# Patient Record
Sex: Male | Born: 1960
Health system: Southern US, Community
[De-identification: ages and names within clinical notes are randomized; demographics above are authoritative.]

## PROBLEM LIST (undated history)

## (undated) DIAGNOSIS — M1710 Unilateral primary osteoarthritis, unspecified knee: Secondary | ICD-10-CM

## (undated) DIAGNOSIS — R7303 Prediabetes: Secondary | ICD-10-CM

## (undated) DIAGNOSIS — E785 Hyperlipidemia, unspecified: Secondary | ICD-10-CM

## (undated) DIAGNOSIS — I10 Essential (primary) hypertension: Secondary | ICD-10-CM

## (undated) DIAGNOSIS — M179 Osteoarthritis of knee, unspecified: Secondary | ICD-10-CM

## (undated) HISTORY — DX: Prediabetes: R73.03

## (undated) HISTORY — DX: Unilateral primary osteoarthritis, unspecified knee: M17.10

## (undated) HISTORY — PX: MOUTH SURGERY: SHX715

## (undated) HISTORY — DX: Essential (primary) hypertension: I10

## (undated) HISTORY — DX: Osteoarthritis of knee, unspecified: M17.9

## (undated) HISTORY — PX: HERNIA REPAIR: SHX51

## (undated) HISTORY — DX: Hyperlipidemia, unspecified: E78.5

---

## 2001-04-20 ENCOUNTER — Encounter: Payer: Self-pay | Admitting: Emergency Medicine

## 2001-04-20 ENCOUNTER — Emergency Department (HOSPITAL_COMMUNITY): Admission: EM | Admit: 2001-04-20 | Discharge: 2001-04-20 | Payer: Self-pay | Admitting: Emergency Medicine

## 2001-07-06 HISTORY — PX: OTHER SURGICAL HISTORY: SHX169

## 2002-03-16 ENCOUNTER — Inpatient Hospital Stay (HOSPITAL_COMMUNITY): Admission: AC | Admit: 2002-03-16 | Discharge: 2002-04-05 | Payer: Self-pay

## 2002-03-16 ENCOUNTER — Encounter: Payer: Self-pay | Admitting: Orthopedic Surgery

## 2002-03-19 ENCOUNTER — Encounter: Payer: Self-pay | Admitting: Orthopedic Surgery

## 2002-03-21 ENCOUNTER — Encounter (INDEPENDENT_AMBULATORY_CARE_PROVIDER_SITE_OTHER): Payer: Self-pay

## 2002-03-21 ENCOUNTER — Encounter: Payer: Self-pay | Admitting: Vascular Surgery

## 2002-03-23 ENCOUNTER — Encounter: Payer: Self-pay | Admitting: Orthopedic Surgery

## 2002-03-26 ENCOUNTER — Encounter: Payer: Self-pay | Admitting: Specialist

## 2002-05-02 ENCOUNTER — Encounter: Admission: RE | Admit: 2002-05-02 | Discharge: 2002-07-31 | Payer: Self-pay | Admitting: Orthopedic Surgery

## 2002-08-01 ENCOUNTER — Encounter: Admission: RE | Admit: 2002-08-01 | Discharge: 2002-10-13 | Payer: Self-pay | Admitting: Orthopedic Surgery

## 2003-03-30 ENCOUNTER — Emergency Department (HOSPITAL_COMMUNITY): Admission: EM | Admit: 2003-03-30 | Discharge: 2003-03-30 | Payer: Self-pay | Admitting: Emergency Medicine

## 2003-03-30 ENCOUNTER — Encounter: Payer: Self-pay | Admitting: Emergency Medicine

## 2003-04-03 ENCOUNTER — Ambulatory Visit (HOSPITAL_COMMUNITY): Admission: RE | Admit: 2003-04-03 | Discharge: 2003-04-03 | Payer: Self-pay | Admitting: Orthopedic Surgery

## 2003-04-03 ENCOUNTER — Ambulatory Visit (HOSPITAL_BASED_OUTPATIENT_CLINIC_OR_DEPARTMENT_OTHER): Admission: RE | Admit: 2003-04-03 | Discharge: 2003-04-03 | Payer: Self-pay | Admitting: Orthopedic Surgery

## 2003-09-17 ENCOUNTER — Emergency Department (HOSPITAL_COMMUNITY): Admission: EM | Admit: 2003-09-17 | Discharge: 2003-09-17 | Payer: Self-pay

## 2004-03-22 ENCOUNTER — Emergency Department (HOSPITAL_COMMUNITY): Admission: EM | Admit: 2004-03-22 | Discharge: 2004-03-22 | Payer: Self-pay | Admitting: Emergency Medicine

## 2004-09-19 IMAGING — CR DG FEMUR 2+V*R*
4 series · 4 of 4 positions shown · non-contrast
Comparison: none

CLINICAL DATA: Leg pain, no injury.
 RIGHT FEMUR, TWO VIEWS 
 There is no evidence of fracture or focal bone lesions.  No other significant bone or soft tissue abnormalities are identified.
 IMPRESSION
 Normal study.

[view not recorded (1 of 4)]
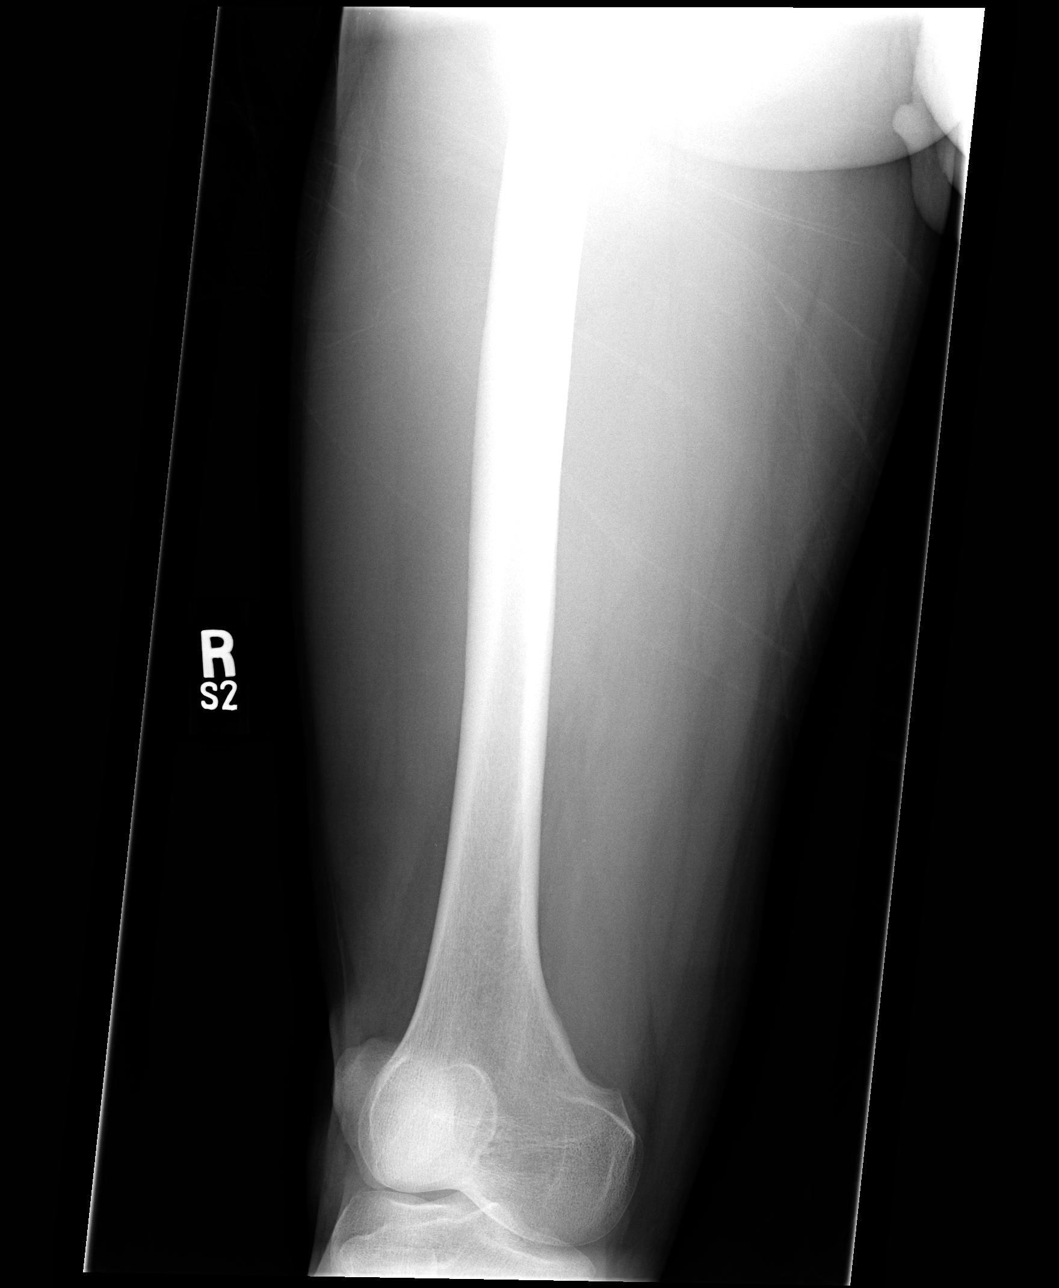

[view not recorded (2 of 4)]
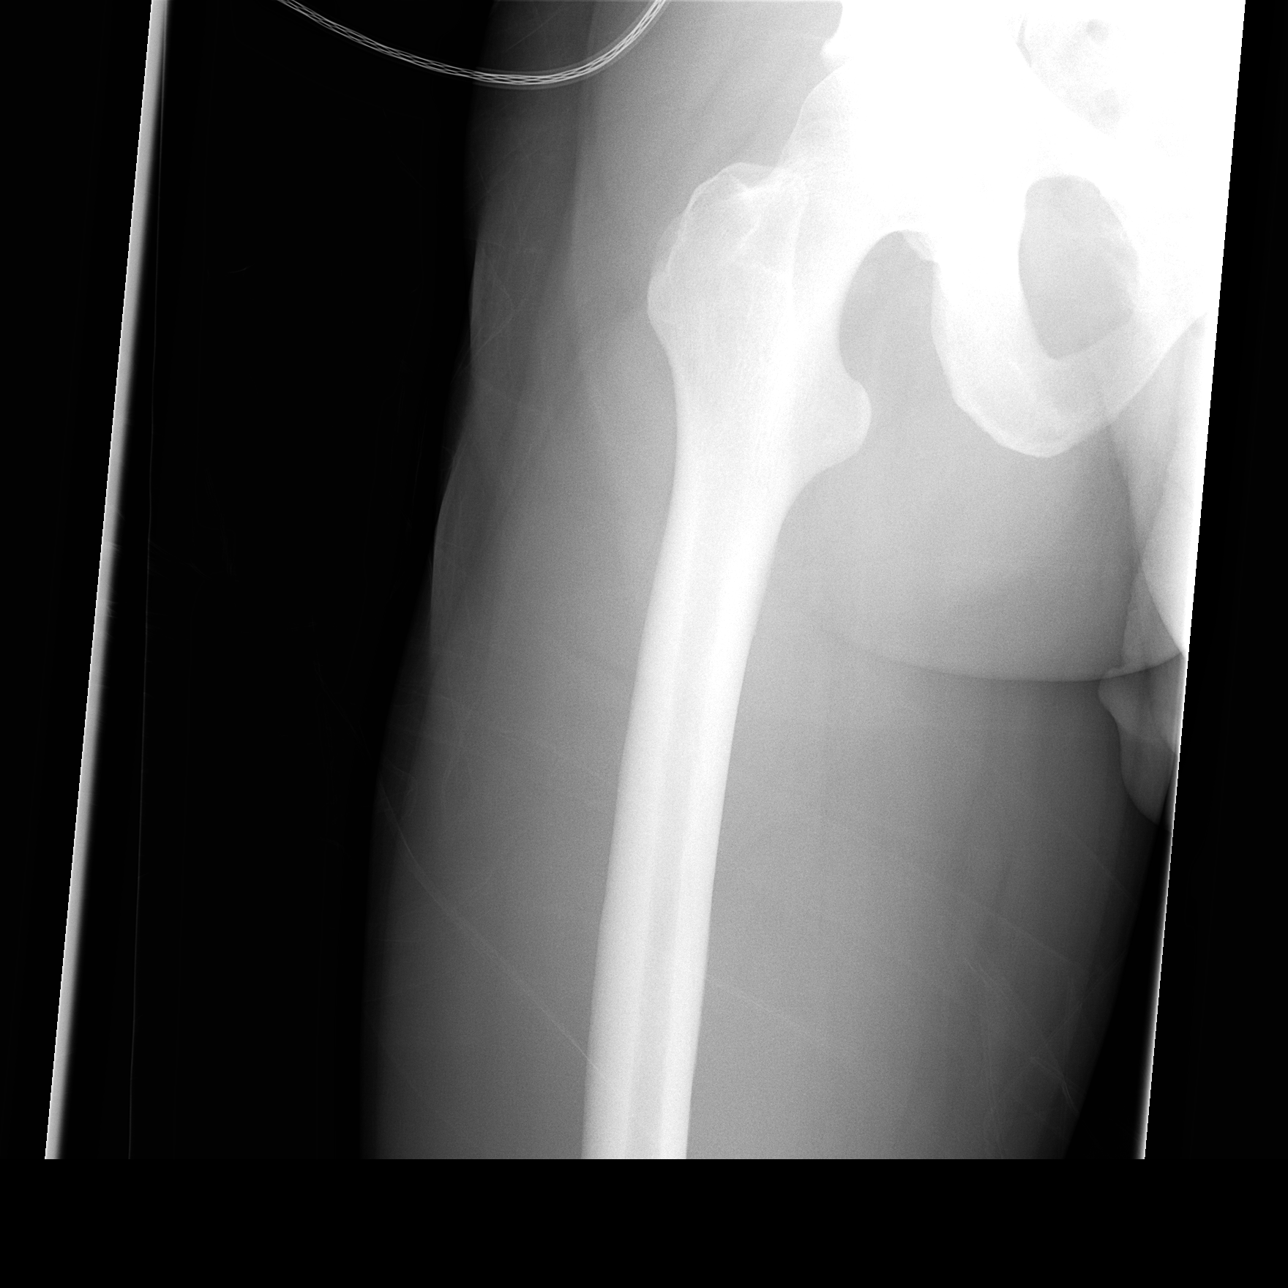

[view not recorded (3 of 4)]
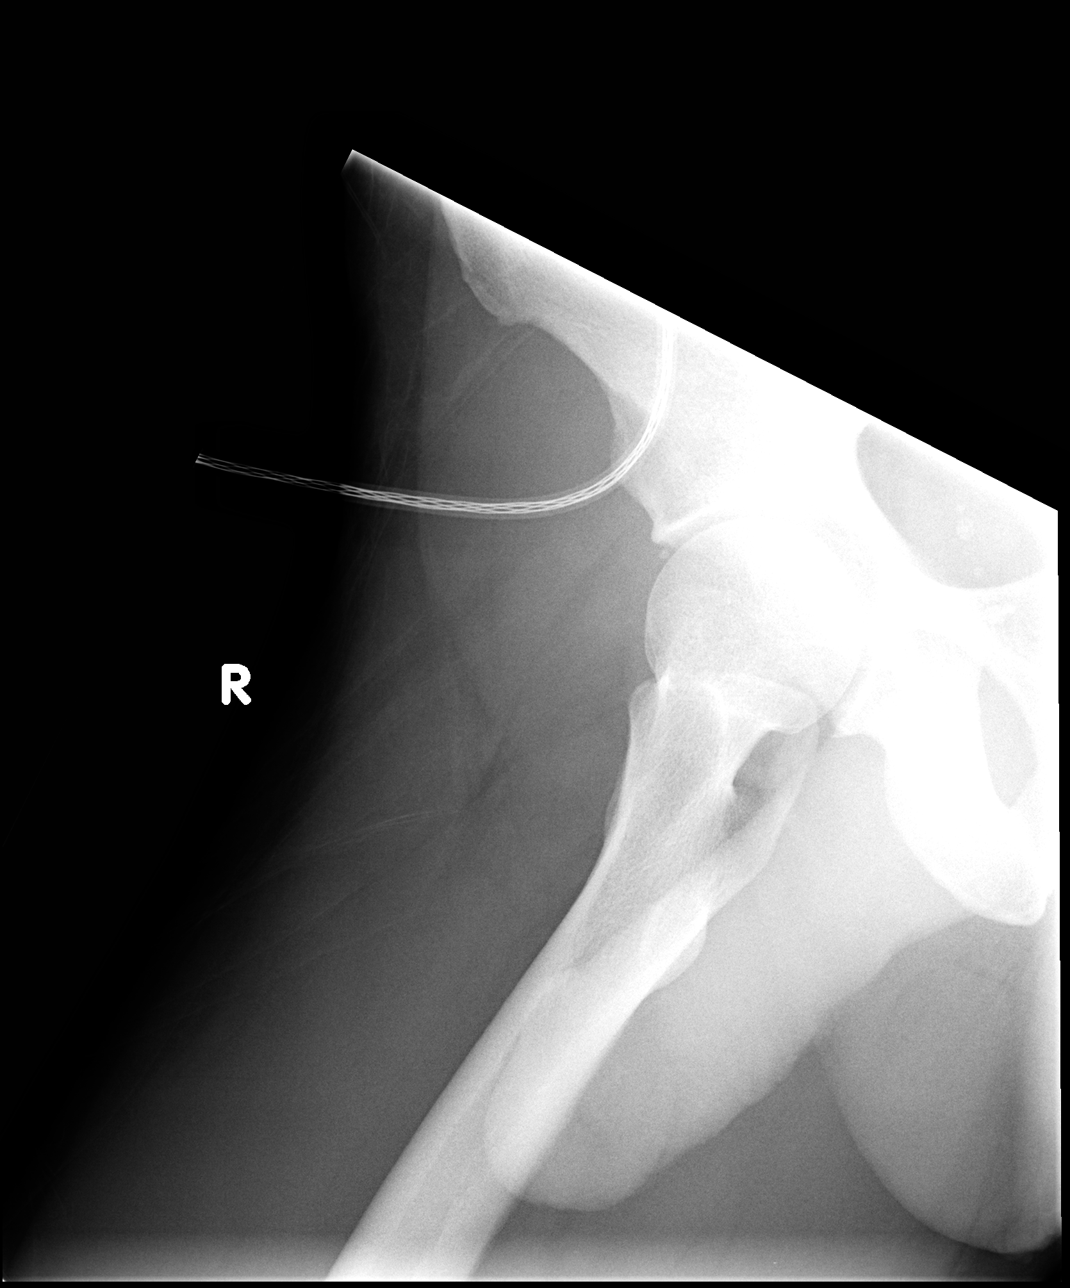

[view not recorded (4 of 4)]
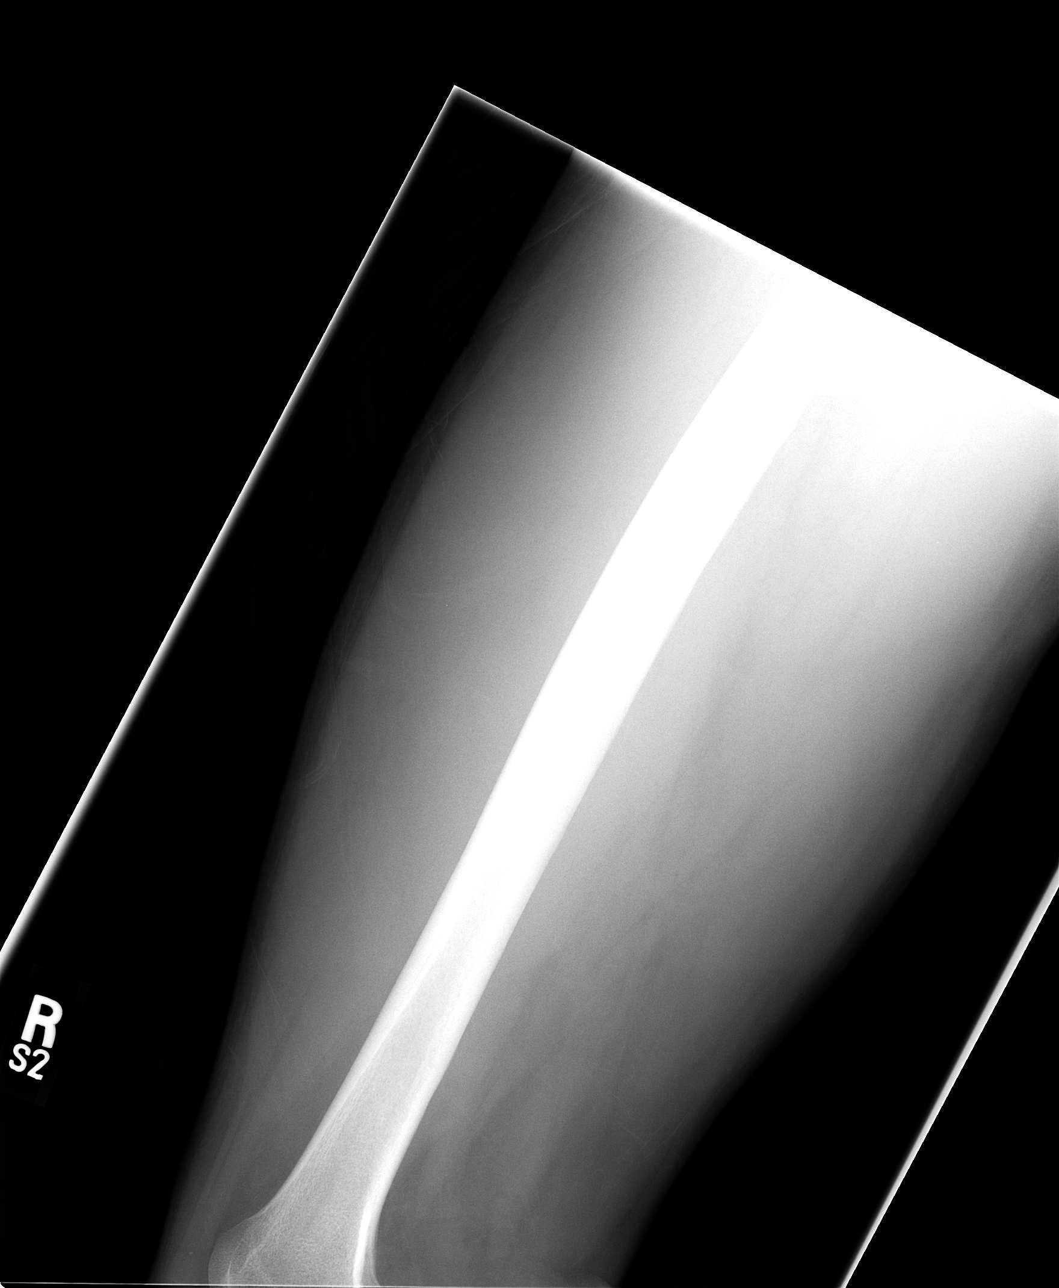

[4 of 4 positions shown; findings below may reference images not displayed]

## 2005-11-06 ENCOUNTER — Emergency Department (HOSPITAL_COMMUNITY): Admission: EM | Admit: 2005-11-06 | Discharge: 2005-11-06 | Payer: Self-pay | Admitting: Emergency Medicine

## 2006-01-18 ENCOUNTER — Emergency Department (HOSPITAL_COMMUNITY): Admission: EM | Admit: 2006-01-18 | Discharge: 2006-01-18 | Payer: Self-pay | Admitting: Emergency Medicine

## 2007-10-22 ENCOUNTER — Inpatient Hospital Stay (HOSPITAL_COMMUNITY): Admission: EM | Admit: 2007-10-22 | Discharge: 2007-10-24 | Payer: Self-pay | Admitting: Emergency Medicine

## 2007-11-22 ENCOUNTER — Ambulatory Visit (HOSPITAL_COMMUNITY): Admission: RE | Admit: 2007-11-22 | Discharge: 2007-11-22 | Payer: Self-pay | Admitting: Otolaryngology

## 2008-04-16 ENCOUNTER — Emergency Department (HOSPITAL_COMMUNITY): Admission: EM | Admit: 2008-04-16 | Discharge: 2008-04-16 | Payer: Self-pay | Admitting: Emergency Medicine
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
## 2010-11-21 NOTE — Op Note (Signed)
   NAME:  MARCEL, GARY                           ACCOUNT NO.:  [id number]   MEDICAL RECORD NO.:  [id number]                   PATIENT TYPE:  INP   LOCATION:  3303                                 FACILITY:  [facility]   PHYSICIAN:  Philips J. Montez Morita, M.D.             DATE OF BIRTH:  01/05/1961   DATE OF PROCEDURE:  03/26/2002  DATE OF DISCHARGE:                                 OPERATIVE REPORT   PREOPERATIVE DIAGNOSES:  Open amputation below-knee, right leg.   POSTOPERATIVE DIAGNOSES:  Open amputation below-knee, right leg.   OPERATION PERFORMED:  Dressing change under anesthesia with some debridement  and irrigation and debridement of wound.   SURGEON:  Philips J. Montez Morita, M.D.   ASSISTANT:  Druscilla Brownie. Cherlynn June.   ANESTHESIA:  General.   DESCRIPTION OF PROCEDURE:  After suitable general anesthesia, the dressing  was changed.  There were a few small areas of dead tissue that were easily  debrided with knife and pick up.  As the dressings were removed, there was  one vigorous arterial bleeder in the lower end of the wound that was clamped  and tied with a 3-0 coated Vicryl.  There was nothing about the area that  looked of infection.  There was nothing that appeared to need culture.  There were no pockets that were noted.  So the wound was then prepped with  Betadine and irrigated with power lavage and then with some antibiotic  solution.  It was then dressed with Xeroform and a compression dressing.  The patient was then transferred to the recovery room in good condition.                                                 Philips J. Montez Morita, M.D.    PJC/MEDQ  D:  03/26/2002  T:  03/27/2002  Job:  [id number]

## 2010-11-21 NOTE — Op Note (Signed)
NAME:  ETAN, VASUDEVAN                           ACCOUNT NO.:  [id number]   MEDICAL RECORD NO.:  [id number]                   PATIENT TYPE:  INP   LOCATION:  2550                                 FACILITY:  [facility]   PHYSICIAN:  Vania Rea. Supple, M.D.               DATE OF BIRTH:  Nov 09, 1960   DATE OF PROCEDURE:  03/16/2002  DATE OF DISCHARGE:                                 OPERATIVE REPORT   PREOPERATIVE DIAGNOSIS:  Grade 3B open left midshaft tibia-fibula fracture.   POSTOPERATIVE DIAGNOSIS:  Grade 3B open left midshaft tibia-fibula fracture.   PROCEDURES:  1. Exploration, irrigation, and debridement of grade 3B open left tibia-     fibula fracture.  2. Exploration and neurolysis of the posterior tibial and anterior tibial     nerves and associated arteries.  3. External fixation of left grade 3B open tibia-fibula fracture.   SURGEON:  Vania Rea. Supple, M.D.   Threasa HeadsFrench Ana A. Shuford, P.A.-C.   ANESTHESIA:  General endotracheal.   ESTIMATED BLOOD LOSS:  300 cc.   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  He sustained a severe twisting injury as his leg was  wrapped around the auger device.  In the field he was found to have an  obvious deformity of the left leg with a dysvascular foot.  He was brought  to the [facility] emergency room, where evaluation showed a pulseless left  foot although with Doppler evaluation and some straightening of his foot, a  dopplerable pulse could intermittently be obtained.  He did complain of  diffuse paresthesias in the left foot, and there was sluggish capillary  refill.  The wound itself was highly contaminated with dirt and other  organic material.  The tibial shaft had been stripped approximately 12 cm  proximally and 8 cm distally with a butterfly fragment.  Initially it was  felt that due to the severe soft  tissue injury, contamination, and  neurovascular status, that a primary amputation would be indicated, and we  initially discussed with the patient a primary amputation and delayed  closure.  He had consented for an amputation and was taken to the operating  room emergently for debridement.   I counseled the patient and his family preoperatively regarding the various  treatment options, and it had initially been my recommendation that we  proceed with an amputation due to what I felt was apparent loss of sensation  on the plantar aspect of the foot and complete disruption of all  neurovascular structures.  They had agreed with this plan.   DESCRIPTION OF PROCEDURE:  The patient was brought emergently to the  operating room and underwent smooth induction of general endotracheal  anesthesia.  We applied a tourniquet to the thigh, but this  was not  inflated.  We performed an initial superficial debridement of the gross  contamination and then sterilely prepped and draped the leg in standard  fashion.  We performed a sequential debridement beginning at the periphery  of the wound and moved from proximal to distal and superficial to deep,  encountered several areas where several ounces of dirt had been caught in  the tissues.  We performed a meticulous debridement and utilized both  pulsatile lavage as well as the DePuy soft tissue debrider to meticulously  debride all soft tissue surfaces of the gross contamination.  We then  identified the posteromedial and anterior neurovascular bundles and  carefully followed these from proximal to distal and found that the tibial  nerve was in continuity throughout the zone of trauma and, in addition, the  tibial artery had a pulse throughout the zone of trauma, although it was  markedly contused and was under apparent vasospasm.  We then used the  Doppler and found a pulse distally beyond the site of trauma.  The tibialis  anterior and deep peroneal  nerve were then identified throughout proximal to  distal, and again we had pulses throughout this zone by Doppler.  At this  point it was felt that since the nerves were in continuity that attempt  could be made to save the foot and perform an external fixation and wound  debridement.  We meticulously debrided all bony and soft tissue surfaces and  utilized 12 L of saline with pulse lavage.  Once meticulous debridement had  been completed, we then placed a Synthes anterior frame with two threaded  half pins proximally and two threaded half pins distally through the tibial  shaft and metaphysis.  These were then connected utilizing two connecting  bars.  We were able to obtain an anatomic reduction on the tibia and the  fibula based on direct visualization.  We used fluoroscopic imaging to  confirm that the threaded half pins were at the appropriate depth and also  to confirm appropriate alignment once the frame was tightened.  Once we  confirmed good alignment, a final irrigation was performed.  The wound was  then packed open with a normal saline wet-to-dry dressing.  A the end of the  case there was a palpable dorsal pedal pulse, and the patient did report  that he had gross sensation intact to the dorsal and plantar aspects of the  left foot.  He was subsequently extubated and taken to the recovery room in  stable condition, where postoperative neurovascular examination confirmed  the above-mentioned findings of grossly intact sensation.                                               Vania Rea. Supple, M.D.    KMS/MEDQ  D:  03/16/2002  T:  03/17/2002  Job:  [id number]

## 2010-11-21 NOTE — Op Note (Signed)
NAME:  Carl Vang, Carl Vang                           ACCOUNT NO.:  [id number]   MEDICAL RECORD NO.:  [id number]                   PATIENT TYPE:  INP   LOCATION:  3303                                 FACILITY:  [facility]   PHYSICIAN:  Vania Rea. Supple, M.D.               DATE OF BIRTH:  [birth year]   DATE OF PROCEDURE:  03/20/2002  DATE OF DISCHARGE:                                 OPERATIVE REPORT   PREOPERATIVE DIAGNOSES:  Left grade 3B highly contaminated tib-fib fracture.   POSTOPERATIVE DIAGNOSES:  1. Left grade 3B highly contaminated tib-fib fracture.  2. 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   OPERATION PERFORMED:  Exploration, irrigation and debridement of  myonecrosis, gas gangrene involving grade 3B open left tib-fib fracture.   SURGEON:  Vania Rea. Supple, M.D.   Threasa HeadsFrench Ana A. Vang, P.A.-C.   ANESTHESIA:  General endotracheal.   ESTIMATED BLOOD LOSS:  Minimal.   Wounds packed open.  Intraoperative cultures, aerobic and anaerobic from the  deep posterior and superficial posterior compartments.   INDICATIONS FOR PROCEDURE:  The patient is a 50 year old gentleman who  sustained a devastating grade 3B highly contaminated tib-fib fracture on the  left when his leg was caught in an auger last Thursday.  He was taken  emergently to surgery for aggressive incision and drainage and external  fixation and follow-up incision and drainage and was brought back for a  third incision and drainage today.  Over the weekend he has been  persistently febrile to 103 degrees with tachycardia and persistent foul  smell from the left leg.  He has maintained some limited digital motion and  intact but moderately decreased sensation to the plantar aspect of the left  foot.  He is brought to the operating room at this time for initially  planned incision and drainage with possible VAC placement.   Preoperatively I discussed with Carl Vang the gravity of the situation and  the fact that he may ultimately require amputation due to the severe  contamination and soft tissue loss.  In addition, his persistent fevers are  an ominous sign.  He understands, accepts and agrees with plan for incision  and drainage.   DESCRIPTION OF PROCEDURE:  After undergoing routine preop evaluation, the  patient was brought to the operating room, placed supine on the operating  room and underwent smooth induction of general endotracheal anesthesia.  Dressings were removed from the left leg which were very foul-smelling and  obviously colonized with Pseudomonas.  The wound itself showed some limited  areas of viable muscle superficially but the skin edges were all dusky and  necrotic and then on further exploration, there was evidence of gross  purulence and gas with extremely foul smell emanating from the deeper  tissues.  The leg was sterilely  prepped and draped in standard fashion.  The  previously irrigated fascial planes were reopened and cultures were obtained  from both the deep and superficial posterior compartments.  Abundant  purulence and foul smell was encountered through the deeper layers and  several areas of gross contamination with gravely debris were also  encountered and removed.  The entire musculature of the deep posterior  compartment had necrosed and this was removed in its entirety.  In addition,  the lateral compartment was high necrotic and the superficial posterior  compartment musculature was also all necrosed and removed in its entirely.  This left the overlying skin and the tibialis posterior neurovascular bundle  as well as the anterior compartment musculature as the only tissues  connecting between proximal and distal.  In addition, there was obvious  frank necrosis of tissues extending into the foot and ankle region in the  distal aspect of the wound.  It was felt that this  was not a salvageable  situation.  However, the patient had not been consented for an amputation.  I did intraoperatively speak with family members present and they all felt  that it would be better to have a family discussion prior to completion of  the amputation.  I did feel as though we had obtained appropriate debulking  of the myonecrosis and complete decompression of the collections of gas and  fluid and that it would reasonable to allow family discussion with return to  the operating room later this evening or early in the morning for completion  of the amputation.  At this point the leg was pulsatilely lavaged.  A wet-to-  dry dressing was applied.  The patient was then extubated and taken to the  recovery room in stable condition.                                                 Vania Rea. Supple, M.D.    KMS/MEDQ  D:  03/20/2002  T:  03/20/2002  Job:  [id number]

## 2010-11-21 NOTE — Op Note (Signed)
NAME:  Carl Vang, Carl Vang                           ACCOUNT NO.:  [id number]   MEDICAL RECORD NO.:  [id number]                   PATIENT TYPE:  INP   LOCATION:  5009                                 FACILITY:  [facility]   PHYSICIAN:  Vania Rea. Supple, M.D.               DATE OF BIRTH:  May 12, 1961   DATE OF PROCEDURE:  03/17/2002  DATE OF DISCHARGE:                                 OPERATIVE REPORT   PREOPERATIVE DIAGNOSIS:  Open left grade 3B tibia-fibula fracture.   POSTOPERATIVE DIAGNOSIS:  Open left grade 3B tibia-fibula fracture.   PROCEDURE:  Exploration, irrigation, and debridement of the left grade 3B  tibia-fibula fracture.   SURGEON:  Vania Rea. Supple, M.D.   Threasa HeadsFrench Ana A. Shuford, P.A.-C.   ANESTHESIA:  LMA general.   TOURNIQUET TIME:  None was used.   ESTIMATED BLOOD LOSS:  Less than 100 cc.   DRAINS:  None.   HISTORY:  The patient is a 50 year old gentleman who sustained a highly  contaminated grade 3B tibia-fibula fracture with a large soft tissue wound.  He underwent the initial irrigation and debridement and stabilization with  external fixation yesterday.  He was brought back to the operating room  today for repeat I&D and wound exploration.   Preoperatively the patient was again counseled on treatment options and  risks versus benefits thereof.   DESCRIPTION OF PROCEDURE:  The patient was brought to the operating room and  placed supine on the operating table and underwent smooth induction of LMA  general anesthesia.  The dressings were removed from the left lower  extremity, and the foot appeared viable with brisk capillary refill.  Pulses  have been intermittent palpable but continuously dopplerable since the  fixation.  The wound itself had abundant serosanguineous drainage.  The leg  was sterilely prepped and draped in standard fashion.  Utilizing pulsatile  lavage to debride the wound and then meticulously went throughout the wound  and removed  residual organic material, primarily dirt and gravel, which was  actually quite minimal compared to the wound yesterday.  There were still  several portions of sand and gravel that we removed.  The wound was  meticulously cleaned, and then we completed the pulsatile lavage with a  total of 8 L.  We inspected the neurovascular bundles, and these again  appeared to be intact, and the foot appeared viable at the end of the case.  A triple antibiotic solution was then used to soak a Kerlix, and we made a  damp-to-dry dressing, wrapping the extremity.  The patient was then  extubated and taken to the recovery room in stable condition.   The postop plan will be for a return to the operating room Monday morning  for repeat I&D as well as placement of a V.A.C.  Vania Rea. Supple, M.D.    KMS/MEDQ  D:  03/17/2002  T:  03/17/2002  Job:  [id number]

## 2010-11-21 NOTE — Op Note (Signed)
NAME:  Carl Vang, Carl Vang                           ACCOUNT NO.:  [id number]   MEDICAL RECORD NO.:  [id number]                   PATIENT TYPE:  INP   LOCATION:  3303                                 FACILITY:  [facility]   PHYSICIAN:  Vania Rea. Supple, M.D.               DATE OF BIRTH:  [birth year]   DATE OF PROCEDURE:  03/24/2002  DATE OF DISCHARGE:                                 OPERATIVE REPORT   PREOPERATIVE DIAGNOSES:  Open right below-knee amputation.   POSTOPERATIVE DIAGNOSES:  Open right below-knee amputation.   PROCEDURE:  Exploration, irrigation, and debridement of open left below-knee  amputation.   SURGEON:  Vania Rea. Supple, M.D.   ASSISTANT:  _____________ P.A.-C.   ANESTHESIA:  LMA, general.   ESTIMATED BLOOD LOSS:  100 cc.   SPECIMENS:  Aerobic and anaerobic cultures were obtained from area of  purulence noted in the central aspect of the wound.   DRAINS:  None.   HISTORY:  The patient is a 50 year old gentleman who sustained a grade III  __________fracture which went on to gas gangrene, requiring a guillotine  amputation.  He was brought back to the operating room at this time for  planned repeat I&D.   PROCEDURE IN DETAIL:  After undergoing routine prep and evaluation, the  patient was brought to the operating room and placed supine on the operating  table and under the smooth induction of LMA and general anesthesia.  The  original postop dressings were removed, and there was very minimal evidence  for Pseudomonas colonization of the dressings.  The stump itself appeared  generally to be healing well with no gross foul smell.  He had several  superficial areas of skin and tissue necrosis.  Retention sutures which had  been placed at the last surgery were removed.  There were several areas of  superficial purulence, in particular one adjacent to the tibial shaft, and  we did a panculture, both aerobic and anaerobic from this region.  The  extremity was sterilely  prepped and draped in a standard fashion at this  point.  We then reopened all of the tissue planes and found several small  areas of purulence but no obvious foul smell.  We performed a combination of  sharp dissection, as well as debridement with a rongeur, removing several  nonviable areas.  The skin edges were trimmed of necrotic tissue, and we  further contoured the muscle flaps.  All of the remaining muscle areas were  then tested with electrocautery and noted to be contractile.  A pulsatile  lavage was then utilized to irrigate 8 liters of saline through the wound,  obtaining healthy-appearing, clean  tissue margins.  We then utilized a 2-0 nylon to loosely reapproximate the  soft tissue envelope over the end of the tibia.  A saline damp-to-dry  dressing was then applied, followed by ABDs and an Ace bandage.  The patient  was then extubated and taken to the recovery room in stable condition.                                               Vania Rea. Supple, M.D.    KMS/MEDQ  D:  03/24/2002  T:  03/26/2002  Job:  [id number]

## 2010-11-21 NOTE — Discharge Summary (Signed)
NAME:  Carl Vang, Carl Vang                           ACCOUNT NO.:  [id number]   MEDICAL RECORD NO.:  [id number]                   PATIENT TYPE:  INP   LOCATION:  5027                                 FACILITY:  [facility]   PHYSICIAN:  Vania Rea. Supple, M.D.               DATE OF BIRTH:  10-27-1960   DATE OF ADMISSION:  03/16/2002  DATE OF DISCHARGE:  04/05/2002                                 DISCHARGE SUMMARY   ADMISSION DIAGNOSIS:  Near complete amputation of left lower extremity from  auger accident.   DISCHARGE DIAGNOSES:  1. Status post completion of left lower leg below-knee amputation and     multiple other procedures as described below.  2. Postoperative hemorrhagic anemia requiring multiple transfusions.   CONSULTANTS:  Trauma consultant is Dr. Abbey Chatters. CVTS consultants are Dr.  Waverly Ferrari and Dr. Liliane Bade. Dr. Ninetta Lights, infectious disease  consultant.   OPERATIONS:  First operation on 03/16/02, exploration, irrigation, and  debridement of grade III-B open left TIB-FIB fracture. The second part of  the procedure was exploration and neurolysis of posterior tibial and  anterior tibial nerves and associated arteries and application of external  fixator to left graft III-B open TIB-FIB fracture on 03/16/02 under general  anesthesia. On 03/17/02, reexploration and debridement of left lower leg. On  03/17/02, he also had an arteriogram performed by Dr. Edilia Bo. On 03/20/02, he  had a repeat irrigation and debridement and exploration of left lower leg  with findings of myonecrosis and gas gangrene. A second procedure on 03/20/02  of complete irrigation and completion of amputation of left below the knee.  All of these procedures that I just listed were by Dr. Rennis Chris and Ralene Bathe who was the assistant, except for the arteriogram. On 9/19,  reexploration, irrigation and debridement of left below-knee amputation. On  9/21, another procedure by Dr. Ronnell Guadalajara, a repeat  irrigation and  debridement of left below-knee amputation and again on 9/23 a repeat  irrigation and debridement of left below-knee amputation with secondary  closure, and this was Dr. Rennis Chris at this point.   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  He was brought to [facility] emergency room where he was found to have a  pulseless left foot; however, on his initial presentation, a Doppler  evaluation had had an earlier present pulse. Upon straightening the foot,  the dopplerable pulse was returned. He did complain of diffuse paresthesias  preoperatively. He has significant grade III-B left TIB-FIB fracture  associated with this injury. Initially, it was felt due to severe soft  tissue injury contamination as it was an extremely dirty wound, and primary  amputation would be most indicated. On presentation to the  first operating  room visit, we did find after reducing the fracture and cleaning the wound  intact posterior tibial and anterior tibial nerves and associated arteries.  Unfortunately, there was a significant gross contamination of the soft  tissue wound. It was meticulously pulsatile lavaged, and it was decided to  try to salvage the limb, and so therefore, an external fixator was applied  with the idea to return the following morning for repeat I&D. This was  performed the following day, and after second I&D, he unfortunately had a  vasospasm, prompting CVTS evaluation. Arteriograms were performed at that  time which he was found to have a continued extensive bony and soft tissue  injury with apparent occlusion of the left anterior tibial and peroneal  arteries; however, the left posterior tibial artery was found to be intact,  only with intense spasm. He was maintained in intensive care unit  through  the weekend and again taken to the operating room on 03/20/02 for repeat I&D.  He did have significant temperature spikes through the weekend. He remained  persistently febrile up to 103 with tachycardia and persistent foul smell  from the leg. He had maintained some limited digital motion distally,  however, had moderately decreased sensation. He was taken back to the  operating room the morning of 03/20/02 where the intraoperative findings were  a very foul-smelling, obviously colonized Pseudomonas wound with myonecrosis  and limited areas of any viable muscle apparent. As the patient had not been  permitted for an amputation, it was decided to have a meeting throughout the  day and to return the evening of 9/15 for repeat I&D and for left below-knee  amputation. He did return to the operating room on 9/15 with complete  amputation and left the wound open due to its contamination. Cultures had  been taken, and he was on wide spectrum IV antibiotics. His sepsis cleared,  and he again returned to the operating room on 9/19 and then underwent every  other day washouts until we found some viable skin edges without any  evidence of any continued infection. These were completed on 9/19, 9/21, and  03/28/02. We did get infectious disease consult to help [place] along with his IV  antibiotics. He had been maintained on gentamycin, Zosyn, initially Ancef,  and a wide spectrum of IV antibiotics, and when no further sign of sepsis  and after his wound being closed for over 72 hours without any temperature  spikes, he was changed to oral Cipro. The patient did as expected with  multiple procedures have postoperative anemias, and he was transfused a  total of 7 units throughout this course of hospitalization. He had a very  lengthy stay and unfortunately was unable to salvage his left limb, however, with multiple I&Ds, did show good evidence for skin healing and without any  further evidence of  infection. He had been transferred up to the regular  orthopedic floor and had no further signs of sepsis for multiple days.  Rehabilitation consult had been ordered; however, patient was doing all of  his transfers on his own to wheelchair and ambulating unassisted with walker  device. His wife was a nurses aid, and due to the lengthy hospitalization,  the decision was made for him to discharge to home with home health needs  arranged. On 04/05/02, he had been afebrile for over 72 hours, his white  counts had decreased to within normal range-his last count being 9.3, and  his last hemoglobin before  discharge was 10.3. Multiple lab drawings found  in chart and may review these for details. His white count had gone to a  high of 23 and 25 around the time of his gas gangrene and his amputation;  however, multiple white counts have been normal prior to discharge. Multiple  cultures also take which are in the chart for details. He did have  Pseudomonas as well as Serratia marcescens grow out of his wound as well as  Enterobacter. His blood type was noted to be A+. Multiple other lab reports  are seen; please see that section for details. Seven units found transfused  and recorded in the chart. X-rays postoperatively initially showed an  external fixator with anatomic alignment of TIB-FIB fracture. On 9/14, there  was right basilar pneumonia or atelectasis noted. On 9/16, a minimal  perihilar atelectasis was noted and again on 9/18, and on 9/21, a central  line was noted with no pneumothorax.   CONDITION ON DISCHARGE:  Stable.   DISCHARGE PLANS:  The patient has been discharged to home with his family.  He is on Cipro 750 mg b.i.d. for one month. He is provided with  prescriptions with Percocet 5/325, #40, one to two q.4-6h. p.r.n. pain. He  will follow up in our office in one week for repeat evaluation of the skin  edges. He is for transfers to wheelchair and with his walker as tolerated.   Daily dressing changes with a home health nurse with Adaptic, ABD, and  Kerlix and call for any changes or foul smell or increased drainage. We will  follow him on an outpatient basis and refer to him amputee clinic program.  He is also placed on Trinsicon one t.i.d., iron supplement. He is on one  aspirin a day. He will call the office for any further details.     Tracy A. Shuford, P.A.-C.                 Vania Rea. Supple, M.D.    TAS/MEDQ  D:  05/03/2002  T:  05/04/2002  Job:  [id number]

## 2010-11-21 NOTE — Consult Note (Signed)
NAME:  Carl Vang, Carl Vang                           ACCOUNT NO.:  [id number]   MEDICAL RECORD NO.:  [id number]                   PATIENT TYPE:  INP   LOCATION:  2550                                 FACILITY:  [facility]   PHYSICIAN:  Di Kindle. Edilia Bo, M.D.        DATE OF BIRTH:  [birth year]   DATE OF CONSULTATION:  03/17/2002  DATE OF DISCHARGE:                                   CONSULTATION   REASON FOR CONSULTATION:  Left leg injury with possible vascular compromise.   HISTORY OF PRESENT ILLNESS:  This is a 50 year old gentleman who reportedly  got his left leg caught in an auger and sustained a grade IIIB left tibia-  fibula fracture.  This patient was taken tot the operating room on 03/16/2002  by Dr. Rennis Chris at which time it was noted that the posterior tibial artery  was intact.  There was no active bleeding from the peroneal artery that  could be determined, and there was som periarterial hematoma around the  anterior tibial artery.  After the patient underwent application of an  external fixator with the leg back out at length, the patient had good  Doppler signals in the foot reportedly, and the foot appeared well perfused.  On postoperative day #1, it was noted that there was markedly diminished  posterior tibial signal with no other signals obtainable,and the foot looked  somewhat mottled.   For this reason, he underwent an arteriogram by Dr. Madilyn Fireman which showed no  evidence of occlusive disease in the femoral, superficial femoral, or  popliteal arteries. The anterior tibial and peroneal arteries were occluded  proximally, and the posterior tibial artery was patent to the foot; however,  there was significant diffuse spasm within the posterior tibial artery.  It  was felt that the patient had adequate circulation in the right foot based  on the posterior tibial artery and that the spasm should resolve with time.   PAST MEDICAL HISTORY:  Unobtainable at this time.  This patient  recently had  surgery.  Today he underwent I&D and exploration of his left tibia-fibula  fracture.   PHYSICAL EXAMINATION:  VITAL SIGNS:  Blood pressure 120/70, heart rate 70.  VASCULAR:  He has a palpable femoral pulse.  He has a very brisk posterior  tibial signal with the Doppler in  the left foot, and the left foot appears  adequately perfused.  I am unable to obtain a peroneal or dorsalis pedis  signal.   ASSESSMENT:  This patient obviously is at significant risk for limb loss  given the extensive nature of the wound and possible nerve injury.  I am  unable to assess the nerve function at this time as the patient is sedated.  However, currently the foot appears adequately perfused, and based on his  arteriogram of today, I do not see any optionis for  revascularization.  She should be able to maintain adequate perfusion to the  foot through the posterior tibial artery, and hopefully the spasm will  resolve with time.   Thanks for allowing [place] to participate in this nice patient's care, and we  will follow as needed.                                                Di Kindle. Edilia Bo, M.D.    CSD/MEDQ  D:  03/17/2002  T:  03/19/2002  Job:  [id number]   cc:   Vania Rea. Rennis Chris, M.D.  [address]  [place]  [place] [address]  Fax: [phone]

## 2010-11-21 NOTE — Op Note (Signed)
NAME:  Carl Vang, Carl Vang                           ACCOUNT NO.:  [id number]   MEDICAL RECORD NO.:  [id number]                   PATIENT TYPE:  INP   LOCATION:  3303                                 FACILITY:  [facility]   PHYSICIAN:  Vania Rea. Supple, M.D.               DATE OF BIRTH:  [birth year]   DATE OF PROCEDURE:  03/22/2002  DATE OF DISCHARGE:                                 OPERATIVE REPORT   PREOPERATIVE DIAGNOSIS:  Status post open left below-knee amputation for  treatment of a highly contaminated and infected left grade 3B tibia-fibula  fracture.   POSTOPERATIVE DIAGNOSIS:  Status post open left below-knee amputation for  treatment of a highly contaminated and infected left grade 3B tibia-fibula  fracture.   PROCEDURE:  Exploration, irrigation, and debridement of open left below-knee  amputation stump.   SURGEON:  Vania Rea. Supple, M.D.   Threasa HeadsFrench Ana A. Shuford, P.A.-C.   ANESTHESIA:  LMA general.   ESTIMATED BLOOD LOSS:  150 cc.   CULTURES:  Intraoperative aerobic and anaerobic cultures were obtained from  the depths of the wound.   HISTORY:  The patient is a 49 year old gentleman who sustained a traumatic  below-knee amputation following a grade 3 tibia-fibula fracture, which  became infected and necessitated a below-knee amputation.  He is brought  back to the operating room at this time for planned I&D of the BKA stump.   DESCRIPTION OF PROCEDURE:  After routine preoperative evaluation, the  patient was brought to the operating room and placed supine on the operating  table and underwent smooth induction of an LMA general anesthesia.  The  postop dressings were removed from the left below-knee amputation stump, and  these were contaminated and colonized with Pseudomonas, evidenced by the  smell and the bluish-green discoloration of the tissues.  There was a large  amount of organized clot removed from the end of the stump and several  liquefied areas of blood  were also removed.  There were, however, no obvious  collections of pus and no gas and no significant foul smell, as it had been  noted at the prior surgery, which necessitated the amputation.  The  extremity was sterilely prepped and draped in standard fashion.  We did some  limited debridement of skin flaps which were obviously necrotic, but the  underlying muscle bellies themselves all appeared viable and did contract on  electrical stimulation with the Bovie.  Several bleeders were coagulated.  We then utilized pulsatile lavage to copiously debride all aspects of the  joint.  We did obtain deep cultures at the beginning of the case, both  aerobic and anaerobic.  After the pulsatile lavage was completed, we did  further trim the distal tibia and distal fibular bone ends to improve their  contour as well as the potential soft tissue coverage.  We utilized a 2-0  nylon suture to loosely  reapproximate the gastro-soleus muscle belly remnant  to the tibialis anterior muscle belly remnant to allow some closure of the  size of the wound and then reapproximated from medial to lateral the skin  edges, again providing some very loose coverage of the tissues of the  end of the stump but allowing free drainage of fluid and blood.  A wet-to-  dry dressing was then placed over the stump, followed by multiple ABDs and  Kerlix to reinforce the dressings.  The patient was subsequently extubated  and taken to the recovery room in stable condition.                                               Vania Rea. Supple, M.D.    KMS/MEDQ  D:  03/22/2002  T:  03/23/2002  Job:  [id number]
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
## 2010-11-21 NOTE — Op Note (Signed)
NAME:  Carl Vang, Carl Vang                           ACCOUNT NO.:  [id number]   MEDICAL RECORD NO.:  [id number]                   PATIENT TYPE:  AMB   LOCATION:  DSC                                  FACILITY:  [facility]   PHYSICIAN:  Katy Fitch. Naaman Plummer., M.D.          DATE OF BIRTH:  09/07/1960   DATE OF PROCEDURE:  04/03/2003  DATE OF DISCHARGE:                                 OPERATIVE REPORT   PREOPERATIVE DIAGNOSIS:  Fracture of right ring finger proximal  phalanx,  comminuted spiral oblique, involving diaphysis and distal metaphysis with  butterfly fragment on the ulnar aspect of diaphysis.   POSTOPERATIVE DIAGNOSIS:  Fracture of right ring finger proximal  phalanx,  comminuted spiral oblique, involving diaphysis and distal metaphysis with  butterfly fragment on the ulnar aspect of diaphysis.   OPERATION:  1. Open reduction and internal fixation of right ring finger proximal     phalanx.  2. Incidental incision dorsal aspect right long finger, provoked by     incorrect preoperative permitting, unrecognized despite following  the     entire correct site surgical protocol.   SURGEON:  Katy Fitch. Sypher, M.D.   ASSISTANT:  Jonni Sanger, P.A.-C.   ANESTHESIA:  General by LMA, supervising anesthesiologist Janetta Hora.  Gelene Mink, M.D.   INDICATIONS FOR PROCEDURE:  Carl Vang is a 50 year old gentleman  referred by the [facility] emergency room to the [facility] [place] on March 30, 2003, for management of a fracture to  his right hand ring finger. He was seen in consultation in our office on  March 30, 2003, at which time he was noted to have a forearm based  splint securing a displaced, unstable fracture of his right ring finger  proximal phalanx.  In the emergency room he was noted to have gross instability and was  unsuccessfully reduced by the attending emergency room physician, Dr.  Verlan Friends.   He was referred in a proper, safe  position splint. In the office on  March 30, 2003, we advised him to proceed with open reduction and  internal fixation of his ring finger oblique fracture utilizing lag screw  fixation. The surgery was scheduled on an elective basis on April 03, 2003, under general anesthesia.   Incorrectly, a permit was created for right long finger open reduction and  internal fixation and all members of the surgical team as well as Mr. Bossman  signed off utilizing the correct site surgical protocol for an open  reduction and internal fixation of the right long finger proximal  phalanx.  Mr. Lusk was interviewed in the holding area and confirmed his anticipated  surgery with the surgeon as well as the other members of the operative team.  We chose not to remove his safe position dressing, in that he had an  unstable fracture that would have been a painful experience to have the  splint removed and the fracture displaced without the analgesic effects of  anesthesia.   Once he was taken to the operating room his splint was removed under general  anesthesia and his hand was prepped with Betadine soap and solution and  sterilely draped. The PA assisting in the surgery applied a finger trap to  the long finger, and under traction, there was no apparent deformity of the  long finger. The ring finger did not appear deformed either  in a flexed  posture, nor did the small finger.   After completing the preoperative time out confirming the permitted  procedure of a long finger ORIF, we proceeded to exsanguinate the arm with  an Esmarch bandage and inflated arterial tourniquet to 220 mmHg. A skin  incision was  initiated in the long finger, and immediately  upon skin  entry, there was no ecchymosis  noted, leading to a halt in the surgical  procedure.   The chart was recovered, the x-rays reviewed and clearly there was noted to  be a fracture of the proximal  phalanx. Careful inspection of the  x-ray  revealed  that this was the ring finger, therefore , the long finger  incision was immediately irrigated and closed. The long finger tendons were  not disturbed. A minimal amount of dissection of the subcutaneous space had  begun, at which point a lack of ecchymosis led to immediate abortion of the  procedure.   After proper consultation with the attending anesthesiologist and all of the  members of the surgical team as well as the director of the surgical center,  I recommended proceeding directly to proper surgery on the ring finger. An  incision was fashioned on the dorsal aspect of the ring finger and the  procedure continued as will be noted in the procedure note below.   DESCRIPTION OF PROCEDURE:  Tamon Parkerson was brought to the operating room and  placed in the supine position on the operating table. After an incorrect  site surgery protocol was followed, the aforementioned incident with an  incidental incision on the long finger was terminated. The wound was  repaired, and after confirmation of the fracture radiographically in the  ring finger, we proceeded with a dorsal 3-cm curvilinear incision, exposing  the extensor tendon on the dorsal surface of the ring finger.   The extensor tendon was split in the midline. Freer elevators were used to  elevate the periosteum radial and ulnar. The fracture site was exposed,  cleared of clot. A microcuret was used to remove all free bone fragments and  the fracture main fragments were reduced anatomically.   There was a small butterfly fragment attached to the A2 pulley on the ulnar  aspect of the shaft that could not be well reduced without excessive  exposure. In my judgment this should adequately heal without further  dissection.   This reduced by 3-point molding followed  by application of a fracture  clamp. Three  1.5-mm ASIF self tapping lag screws were placed across the main diaphyseal and distal  fracture fragments. An  anatomic reduction was achieved. AP and  lateral and oblique C-arm  images were obtained, confirming anatomic  reduction and satisfactory hardware position and length.   The fracture was then irrigated with sterile saline. The periosteum was  repaired with a running 4-0 Vicryl, followed  by repair of the extensor  tendon with mattress sutures of 4-0 Mersilene, knots inverted. The skin was  repaired with intradermal 3-0 Prolene  in 2 limbs with Steri-Strips.   Careful examination  of the finger  under anesthesia revealed a 15 degree  flexion contracture of the finger  which probably is a chronic predicament  from old injury. The adjacent index, long and small fingers had full range  of motion of the PIP joints. The alignment was anatomic. The hand was  dressed with Xeroflo sterile gauze and a compression dressing  with the  long, ring and small fingers in safe position.   When Mr. Keehan is fully awake and alert, I will explain the predicament to  him of the wrong site incision. There was no surgery below the level of the  subcutaneous fat and no contact with the tendons or other  structures. The  long-term consequence of this will be an unanticipated 2-cm scar on the  dorsal aspect of the long finger and a correctly reduced and fixed ring  finger.                                               Katy Fitch Naaman Plummer., M.D.    RVS/MEDQ  D:  04/03/2003  T:  04/03/2003  Job:  [id number]   cc:   Risk Management   Charles E. Gelene Mink, M.D.  [address]  [place]  [place] [address]  Fax: [phone]

## 2010-11-21 NOTE — Op Note (Signed)
NAME:  MALAKHAI, BEITLER                           ACCOUNT NO.:  [id number]   MEDICAL RECORD NO.:  [id number]                   PATIENT TYPE:  INP   LOCATION:  3303                                 FACILITY:  [facility]   PHYSICIAN:  Vania Rea. Supple, M.D.               DATE OF BIRTH:  [birth year]   DATE OF PROCEDURE:  03/28/2002  DATE OF DISCHARGE:                                 OPERATIVE REPORT   PREOPERATIVE DIAGNOSIS:  Open left below-knee amputation.   POSTOPERATIVE DIAGNOSIS:  Open left below-knee amputation.   PROCEDURES:  1. Exploration and irrigation and debridement of open left below-knee     amputation, including debridement of skin, muscle, tendon, and bone.  2. Delayed primary closure, left below-knee amputation stump.   SURGEON:  Vania Rea. Supple, M.D.   Threasa HeadsFrench Ana A. Shuford, P.A.-C.   ANESTHESIA:  LMA general.   ESTIMATED BLOOD LOSS:  250 cc.   DRAINS:  None.   HISTORY:  The patient is a 50 year old gentleman who sustained a traumatic  below-knee amputation and has been brought to the operating room for  multiple explorations and irrigations and debridements due to the highly  contaminated nature of his wound.  He has defervesced over the last 72 hours  and has been on appropriate antibiotics, and his white count is now slowly  coming down.  He has been receiving irrigation and debridement every 48  hours, and the plan today is for repeat exploration and debridement with  possible closure of the wound depending on the status of the tissues noted  at the time of surgery.   Preoperatively I have counseled the patient on treatment options as well as  risk versus benefits thereof.  He understands and accepts and agrees to our  planned procedure.   DESCRIPTION OF PROCEDURE:  After undergoing routine preoperative evaluation,  the patient was brought to the operating room and placed supine on the  operating table and underwent smooth induction of LMA general  anesthesia.  Dressings were removed, showing some scant Pseudomonas staining of the  dressings.  The wound itself, however, looked quite good with some early  granulation tissue developing over all of the exposed surfaces.  No obvious  purulence was noted, but there was some fibrinous exudate at the end of the  stump.  Some retention sutures were removed, and then the extremity was  sterilely prepped and draped in standard fashion.  We opened up all the  previous fascial planes and performed some limited debridement of obviously  necrotic tissue, but there was only a very small amount of this.  Pulsatile  lavage was then brought in.  I should mention that we did obtain cultures,  both aerobic and anaerobic, at the beginning of the case.  Once cultures  were obtained, we then pulsatilely lavaged the stump.  We milked the  extremity and did not find any  evidence of accumulated purulent material.  We then utilized a 10 blade to contour the remaining muscle and skin flaps.  In addition, we used the oscillating saw to remove an additional  approximately 2 cm of the distal tibia and also to contour the fibula with  an oblique osteotomy just proximal to the tibial cut.  Final irrigation was  then performed.  Hemostasis was obtained.  We drilled two drill holes  through the anterior tibial cortex.  We then passed a #1 Vicryl through the  drill holes and through two of the posterior soft tissue fascial flaps to  allow formation of a myodesis, bringing the posterior soft tissues over the  end of the tibial stump.  We then performed some side-to-side repairs of the  surrounding soft tissue envelope for appropriate coverage of the distal  tibial stump.  The soft tissue was then repositioned and closed utilizing a  series of interrupted 2-0 Vicryl vertical mattress sutures.  We did contour  the skin flaps to remove several somewhat dysvascular areas and allow  appropriate tension on the repair.   Coverage was achieved very nicely over  the end of the stump.  At this point, then, Xeroform and a bulky dry  dressing were then wrapped over the stump, followed by an Ace bandage.  The  patient was then extubated and taken to the recovery room in stable  condition.                                                Vania Rea. Supple, M.D.    KMS/MEDQ  D:  03/28/2002  T:  03/29/2002  Job:  [id number]

## 2011-03-31 LAB — COMPREHENSIVE METABOLIC PANEL
ALT: 19
AST: 30
Albumin: 4.1
Alkaline Phosphatase: 48
BUN: 8
CO2: 28
Calcium: 9.1
Chloride: 106
Creatinine, Ser: 0.96
GFR calc Af Amer: >60
GFR calc non Af Amer: >60
Glucose, Bld: 104 — ABNORMAL HIGH
Potassium: 4.1
Sodium: 143
Total Bilirubin: 0.6
Total Protein: 6.9

## 2011-03-31 LAB — DIFFERENTIAL
Basophils Absolute: 0.1
Basophils Relative: 0
Eosinophils Absolute: 0.1
Eosinophils Relative: 1
Lymphocytes Relative: 26
Lymphs Abs: 4
Monocytes Absolute: 0.5
Monocytes Relative: 4
Neutro Abs: 10.8 — ABNORMAL HIGH
Neutrophils Relative %: 69

## 2011-03-31 LAB — CBC
HCT: 37.6 — ABNORMAL LOW
Hemoglobin: 12.8 — ABNORMAL LOW
MCHC: 34
MCV: 86.5
Platelets: 266
RBC: 4.34
RDW: 13.3
WBC: 15.6 — ABNORMAL HIGH

## 2011-03-31 LAB — ETHANOL: Alcohol, Ethyl (B): 117 — ABNORMAL HIGH

## 2013-06-16 ENCOUNTER — Encounter: Payer: Self-pay | Admitting: Internal Medicine

## 2013-06-16 ENCOUNTER — Ambulatory Visit: Payer: Medicare Other | Attending: Internal Medicine | Admitting: Internal Medicine

## 2013-06-16 VITALS — BP 125/80 | HR 94 | Temp 98.7°F | Resp 14 | Ht 65.0 in | Wt 177.2 lb

## 2013-06-16 DIAGNOSIS — IMO0002 Reserved for concepts with insufficient information to code with codable children: Secondary | ICD-10-CM | POA: Diagnosis not present

## 2013-06-16 DIAGNOSIS — M25561 Pain in right knee: Secondary | ICD-10-CM | POA: Insufficient documentation

## 2013-06-16 DIAGNOSIS — M25569 Pain in unspecified knee: Secondary | ICD-10-CM | POA: Diagnosis not present

## 2013-06-16 DIAGNOSIS — Z23 Encounter for immunization: Secondary | ICD-10-CM

## 2013-06-16 DIAGNOSIS — Z139 Encounter for screening, unspecified: Secondary | ICD-10-CM | POA: Diagnosis not present

## 2013-06-16 DIAGNOSIS — S88112S Complete traumatic amputation at level between knee and ankle, left lower leg, sequela: Secondary | ICD-10-CM | POA: Insufficient documentation

## 2013-06-16 LAB — CBC WITH DIFFERENTIAL/PLATELET
Basophils Absolute: 0 10*3/uL (ref 0.0–0.1)
Basophils Relative: 0 % (ref 0–1)
Eosinophils Absolute: 0.3 10*3/uL (ref 0.0–0.7)
Eosinophils Relative: 3 % (ref 0–5)
HCT: 38.6 % — ABNORMAL LOW (ref 39.0–52.0)
Hemoglobin: 13.2 g/dL (ref 13.0–17.0)
Lymphocytes Relative: 53 % — ABNORMAL HIGH (ref 12–46)
Lymphs Abs: 4.4 10*3/uL — ABNORMAL HIGH (ref 0.7–4.0)
MCH: 28.4 pg (ref 26.0–34.0)
MCHC: 34.2 g/dL (ref 30.0–36.0)
MCV: 83 fL (ref 78.0–100.0)
Monocytes Absolute: 0.5 10*3/uL (ref 0.1–1.0)
Monocytes Relative: 6 % (ref 3–12)
Neutro Abs: 3.1 10*3/uL (ref 1.7–7.7)
Neutrophils Relative %: 38 % — ABNORMAL LOW (ref 43–77)
Platelets: 280 10*3/uL (ref 150–400)
RBC: 4.65 MIL/uL (ref 4.22–5.81)
RDW: 14.2 % (ref 11.5–15.5)
WBC: 8.2 10*3/uL (ref 4.0–10.5)

## 2013-06-16 LAB — COMPLETE METABOLIC PANEL WITH GFR
ALT: 19 U/L (ref 0–53)
AST: 18 U/L (ref 0–37)
Albumin: 4.6 g/dL (ref 3.5–5.2)
Alkaline Phosphatase: 44 U/L (ref 39–117)
BUN: 11 mg/dL (ref 6–23)
CO2: 30 mEq/L (ref 19–32)
Calcium: 9.6 mg/dL (ref 8.4–10.5)
Chloride: 103 mEq/L (ref 96–112)
Creat: 0.95 mg/dL (ref 0.50–1.35)
GFR, Est African American: >89 mL/min
GFR, Est Non African American: >89 mL/min
Glucose, Bld: 104 mg/dL — ABNORMAL HIGH (ref 70–99)
Potassium: 4.3 mEq/L (ref 3.5–5.3)
Sodium: 139 mEq/L (ref 135–145)
Total Bilirubin: 0.4 mg/dL (ref 0.3–1.2)
Total Protein: 7.1 g/dL (ref 6.0–8.3)

## 2013-06-16 MED ORDER — NAPROXEN 500 MG PO TABS: 500.0000 mg | ORAL_TABLET | Freq: Two times a day (BID) | ORAL | Status: DC

## 2013-06-16 NOTE — Progress Notes (Signed)
Pt is here to establish care and get a physical.

## 2013-06-16 NOTE — Progress Notes (Signed)
MRN: [id number] Name: Carl Vang  Sex: male Age: 52 y.o. DOB: Apr 30, 1961  Allergies: Review of patient's allergies indicates no known allergies.  Chief Complaint  Patient presents with  . Establish Care    HPI: Patient is 52 y.o. male who comes for the first time to establish medical care, patient has history of traumatic amputation of left leg below-knee as per patient it happened in the accident  at his workplace several years ago, has prostheses in place, patient reported to have problem with his right knee has on and off pain denies any fall or trauma and is requesting something medication as per patient Tylenol does not help denies any fever chills chest pain shortness of breath.  History reviewed. No pertinent past medical history.  Past Surgical History  Procedure Laterality Date  . Left leg below knee amputation       in accident at work       Medication List       This list is accurate as of: 06/16/13  3:11 PM.  Always use your most recent med list.               naproxen 500 MG tablet  Commonly known as:  NAPROSYN  Take 1 tablet (500 mg total) by mouth 2 (two) times daily with a meal.        Meds ordered this encounter  Medications  . naproxen (NAPROSYN) 500 MG tablet    Sig: Take 1 tablet (500 mg total) by mouth 2 (two) times daily with a meal.    Dispense:  30 tablet    Refill:  2     There is no immunization history on file for this patient.  History  Substance Use Topics  . Smoking status: Never Smoker   . Smokeless tobacco: Not on file  . Alcohol Use: Yes     Comment: socially     Review of Systems  As noted in HPI  Filed Vitals:   06/16/13 1449  BP: 125/80  Pulse: 94  Temp: 98.7 F (37.1 C)  Resp: 14    Physical Exam  Physical Exam  Constitutional: No distress.  Eyes: EOM are normal. Pupils are equal, round, and reactive to light.  Cardiovascular: Normal rate and regular rhythm.   Pulmonary/Chest: Breath sounds normal. No  respiratory distress. He has no wheezes. He has no rales.  Musculoskeletal:  Right knee minimal tenderness medially, crepitation+ good ROM  Left knee BKA with prosthesis     CBC    Component Value Date/Time   WBC 15.6* 10/22/2007 2123   RBC 4.34 10/22/2007 2123   HGB 12.8* 10/22/2007 2123   HCT 37.6* 10/22/2007 2123   PLT 266 10/22/2007 2123   MCV 86.5 10/22/2007 2123   LYMPHSABS 4.0 10/22/2007 2123   MONOABS 0.5 10/22/2007 2123   EOSABS 0.1 10/22/2007 2123   BASOSABS 0.1 10/22/2007 2123    CMP     Component Value Date/Time   NA 143 10/22/2007 2123   K 4.1 10/22/2007 2123   CL 106 10/22/2007 2123   CO2 28 10/22/2007 2123   GLUCOSE 104* 10/22/2007 2123   BUN 8 10/22/2007 2123   CREATININE 0.96 10/22/2007 2123   CALCIUM 9.1 10/22/2007 2123   PROT 6.9 10/22/2007 2123   ALBUMIN 4.1 10/22/2007 2123   AST 30 10/22/2007 2123   ALT 19 10/22/2007 2123   ALKPHOS 48 10/22/2007 2123   BILITOT 0.6 10/22/2007 2123   GFRNONAA >60 10/22/2007 2123  GFRAA  Value: >60        The eGFR has been calculated using the MDRD equation. This calculation has not been validated in all clinical 10/22/2007 2123    No results found for this basename: chol, tri, ldl    No components found with this basename: hga1c    Lab Results  Component Value Date/Time   AST 30 10/22/2007  9:23 PM    Assessment and Plan  Right knee pain - Plan: COMPLETE METABOLIC PANEL WITH GFR, CBC with Differential, naproxen (NAPROSYN) 500 MG tablet  Traumatic amputation of left leg below knee, has prostheses.  Needs flu shot    Return in about 6 weeks (around 07/28/2013).  Doris Cheadle, MD

## 2013-06-19 ENCOUNTER — Telehealth: Payer: Self-pay | Admitting: Emergency Medicine

## 2013-06-19 ENCOUNTER — Telehealth: Payer: Self-pay

## 2013-06-19 NOTE — Telephone Encounter (Signed)
Pt given lab results with instructions 

## 2013-06-19 NOTE — Telephone Encounter (Signed)
Left message on voice mail to return our call.

## 2013-06-19 NOTE — Telephone Encounter (Signed)
Message copied by Lestine Mount on Mon Jun 19, 2013  2:13 PM ------      Message from: Doris Cheadle      Created: Mon Jun 19, 2013  9:23 AM       Blood work reviewed noticed impaired fasting glucose, call and advise patient for low carbohydrate diet.             ------

## 2013-07-28 ENCOUNTER — Ambulatory Visit: Payer: Medicare Other

## 2014-04-09 ENCOUNTER — Encounter: Payer: Self-pay | Admitting: Internal Medicine

## 2014-04-09 ENCOUNTER — Ambulatory Visit: Payer: Medicare Other | Attending: Internal Medicine | Admitting: Internal Medicine

## 2014-04-09 VITALS — BP 135/80 | HR 71 | Temp 98.8°F | Resp 16 | Wt 171.0 lb

## 2014-04-09 DIAGNOSIS — R350 Frequency of micturition: Secondary | ICD-10-CM

## 2014-04-09 DIAGNOSIS — Z8249 Family history of ischemic heart disease and other diseases of the circulatory system: Secondary | ICD-10-CM | POA: Diagnosis not present

## 2014-04-09 DIAGNOSIS — Z23 Encounter for immunization: Secondary | ICD-10-CM

## 2014-04-09 DIAGNOSIS — Z139 Encounter for screening, unspecified: Secondary | ICD-10-CM

## 2014-04-09 DIAGNOSIS — R7301 Impaired fasting glucose: Secondary | ICD-10-CM | POA: Diagnosis not present

## 2014-04-09 DIAGNOSIS — Z89512 Acquired absence of left leg below knee: Secondary | ICD-10-CM | POA: Insufficient documentation

## 2014-04-09 DIAGNOSIS — R030 Elevated blood-pressure reading, without diagnosis of hypertension: Secondary | ICD-10-CM

## 2014-04-09 DIAGNOSIS — Z833 Family history of diabetes mellitus: Secondary | ICD-10-CM | POA: Diagnosis not present

## 2014-04-09 DIAGNOSIS — IMO0001 Reserved for inherently not codable concepts without codable children: Secondary | ICD-10-CM | POA: Insufficient documentation

## 2014-04-09 DIAGNOSIS — Z1211 Encounter for screening for malignant neoplasm of colon: Secondary | ICD-10-CM

## 2014-04-09 LAB — COMPLETE METABOLIC PANEL WITH GFR
ALT: 15 U/L (ref 0–53)
AST: 19 U/L (ref 0–37)
Albumin: 4.7 g/dL (ref 3.5–5.2)
Alkaline Phosphatase: 47 U/L (ref 39–117)
BUN: 10 mg/dL (ref 6–23)
CO2: 26 mEq/L (ref 19–32)
Calcium: 9.6 mg/dL (ref 8.4–10.5)
Chloride: 102 mEq/L (ref 96–112)
Creat: 0.9 mg/dL (ref 0.50–1.35)
GFR, Est African American: >89 mL/min
GFR, Est Non African American: >89 mL/min
Glucose, Bld: 95 mg/dL (ref 70–99)
Potassium: 4.5 mEq/L (ref 3.5–5.3)
Sodium: 137 mEq/L (ref 135–145)
Total Bilirubin: 0.6 mg/dL (ref 0.2–1.2)
Total Protein: 7 g/dL (ref 6.0–8.3)

## 2014-04-09 LAB — POCT URINALYSIS DIPSTICK
Bilirubin, UA: NEGATIVE
Blood, UA: NEGATIVE
Glucose, UA: NEGATIVE
Ketones, UA: NEGATIVE
Nitrite, UA: NEGATIVE
Protein, UA: NEGATIVE
Spec Grav, UA: 1.01
Urobilinogen, UA: 0.2
pH, UA: 5.5

## 2014-04-09 LAB — HEMOGLOBIN A1C
Hgb A1c MFr Bld: 6.1 % — ABNORMAL HIGH (ref <5.7)
Mean Plasma Glucose: 128 mg/dL — ABNORMAL HIGH (ref <117)

## 2014-04-09 MED ORDER — CIPROFLOXACIN HCL 500 MG PO TABS: 500.0000 mg | ORAL_TABLET | Freq: Two times a day (BID) | ORAL | Status: DC

## 2014-04-09 NOTE — Patient Instructions (Addendum)
DASH Eating Plan DASH stands for "Dietary Approaches to Stop Hypertension." The DASH eating plan is a healthy eating plan that has been shown to reduce high blood pressure (hypertension). Additional health benefits may include reducing the risk of type 2 diabetes mellitus, heart disease, and stroke. The DASH eating plan may also help with weight loss. WHAT DO I NEED TO KNOW ABOUT THE DASH EATING PLAN? For the DASH eating plan, you will follow these general guidelines:  Choose foods with a percent daily value for sodium of less than 5% (as listed on the food label).  Use salt-free seasonings or herbs instead of table salt or sea salt.  Check with your health care provider or pharmacist before using salt substitutes.  Eat lower-sodium products, often labeled as "lower sodium" or "no salt added."  Eat fresh foods.  Eat more vegetables, fruits, and low-fat dairy products.  Choose whole grains. Look for the word "whole" as the first word in the ingredient list.  Choose fish and skinless chicken or turkey more often than red meat. Limit fish, poultry, and meat to 6 oz (170 g) each day.  Limit sweets, desserts, sugars, and sugary drinks.  Choose heart-healthy fats.  Limit cheese to 1 oz (28 g) per day.  Eat more home-cooked food and less restaurant, buffet, and fast food.  Limit fried foods.  Cook foods using methods other than frying.  Limit canned vegetables. If you do use them, rinse them well to decrease the sodium.  When eating at a restaurant, ask that your food be prepared with less salt, or no salt if possible. WHAT FOODS CAN I EAT? Seek help from a dietitian for individual calorie needs. Grains Whole grain or whole wheat bread. Brown rice. Whole grain or whole wheat pasta. Quinoa, bulgur, and whole grain cereals. Low-sodium cereals. Corn or whole wheat flour tortillas. Whole grain cornbread. Whole grain crackers. Low-sodium crackers. Vegetables Fresh or frozen vegetables  (raw, steamed, roasted, or grilled). Low-sodium or reduced-sodium tomato and vegetable juices. Low-sodium or reduced-sodium tomato sauce and paste. Low-sodium or reduced-sodium canned vegetables.  Fruits All fresh, canned (in natural juice), or frozen fruits. Meat and Other Protein Products Ground beef (85% or leaner), grass-fed beef, or beef trimmed of fat. Skinless chicken or turkey. Ground chicken or turkey. Pork trimmed of fat. All fish and seafood. Eggs. Dried beans, peas, or lentils. Unsalted nuts and seeds. Unsalted canned beans. Dairy Low-fat dairy products, such as skim or 1% milk, 2% or reduced-fat cheeses, low-fat ricotta or cottage cheese, or plain low-fat yogurt. Low-sodium or reduced-sodium cheeses. Fats and Oils Tub margarines without trans fats. Light or reduced-fat mayonnaise and salad dressings (reduced sodium). Avocado. Safflower, olive, or canola oils. Natural peanut or almond butter. Other Unsalted popcorn and pretzels. The items listed above may not be a complete list of recommended foods or beverages. Contact your dietitian for more options. WHAT FOODS ARE NOT RECOMMENDED? Grains White bread. White pasta. White rice. Refined cornbread. Bagels and croissants. Crackers that contain trans fat. Vegetables Creamed or fried vegetables. Vegetables in a cheese sauce. Regular canned vegetables. Regular canned tomato sauce and paste. Regular tomato and vegetable juices. Fruits Dried fruits. Canned fruit in light or heavy syrup. Fruit juice. Meat and Other Protein Products Fatty cuts of meat. Ribs, chicken wings, bacon, sausage, bologna, salami, chitterlings, fatback, hot dogs, bratwurst, and packaged luncheon meats. Salted nuts and seeds. Canned beans with salt. Dairy Whole or 2% milk, cream, half-and-half, and cream cheese. Whole-fat or sweetened yogurt. Full-fat   cheeses or blue cheese. Nondairy creamers and whipped toppings. Processed cheese, cheese spreads, or cheese  curds. Condiments Onion and garlic salt, seasoned salt, table salt, and sea salt. Canned and packaged gravies. Worcestershire sauce. Tartar sauce. Barbecue sauce. Teriyaki sauce. Soy sauce, including reduced sodium. Steak sauce. Fish sauce. Oyster sauce. Cocktail sauce. Horseradish. Ketchup and mustard. Meat flavorings and tenderizers. Bouillon cubes. Hot sauce. Tabasco sauce. Marinades. Taco seasonings. Relishes. Fats and Oils Butter, stick margarine, lard, shortening, ghee, and bacon fat. Coconut, palm kernel, or palm oils. Regular salad dressings. Other Pickles and olives. Salted popcorn and pretzels. The items listed above may not be a complete list of foods and beverages to avoid. Contact your dietitian for more information. WHERE CAN I FIND MORE INFORMATION? National Heart, Lung, and [employer] Institute: www.nhlbi.nih.gov/health/health-topics/topics/dash/ Document Released: 06/11/2011 Document Revised: 11/06/2013 Document Reviewed: 04/26/2013 ExitCare Patient Information 2015 [employer]. This information is not intended to replace advice given to you by your health care provider. Make sure you discuss any questions you have with your health care provider. Diabetes Mellitus and Food It is important for you to manage your blood sugar (glucose) level. Your blood glucose level can be greatly affected by what you eat. Eating healthier foods in the appropriate amounts throughout the day at about the same time each day will help you control your blood glucose level. It can also help slow or prevent worsening of your diabetes mellitus. Healthy eating may even help you improve the level of your blood pressure and reach or maintain a healthy weight.  HOW CAN FOOD AFFECT ME? Carbohydrates Carbohydrates affect your blood glucose level more than any other type of food. Your dietitian will help you determine how many carbohydrates to eat at each meal and teach you how to count carbohydrates. Counting  carbohydrates is important to keep your blood glucose at a healthy level, especially if you are using insulin or taking certain medicines for diabetes mellitus. Alcohol Alcohol can cause sudden decreases in blood glucose (hypoglycemia), especially if you use insulin or take certain medicines for diabetes mellitus. Hypoglycemia can be a life-threatening condition. Symptoms of hypoglycemia (sleepiness, dizziness, and disorientation) are similar to symptoms of having too much alcohol.  If your health care provider has given you approval to drink alcohol, do so in moderation and use the following guidelines:  Women should not have more than one drink per day, and men should not have more than two drinks per day. One drink is equal to:  12 oz of beer.  5 oz of wine.  1 oz of hard liquor.  Do not drink on an empty stomach.  Keep yourself hydrated. Have water, diet soda, or unsweetened iced tea.  Regular soda, juice, and other mixers might contain a lot of carbohydrates and should be counted. WHAT FOODS ARE NOT RECOMMENDED? As you make food choices, it is important to remember that all foods are not the same. Some foods have fewer nutrients per serving than other foods, even though they might have the same number of calories or carbohydrates. It is difficult to get your body what it needs when you eat foods with fewer nutrients. Examples of foods that you should avoid that are high in calories and carbohydrates but low in nutrients include:  Trans fats (most processed foods list trans fats on the Nutrition Facts label).  Regular soda.  Juice.  Candy.  Sweets, such as cake, pie, doughnuts, and cookies.  Fried foods. WHAT FOODS CAN I EAT? Have nutrient-rich foods,   which will nourish your body and keep you healthy. The food you should eat also will depend on several factors, including:  The calories you need.  The medicines you take.  Your weight.  Your blood glucose level.  Your  blood pressure level.  Your cholesterol level. You also should eat a variety of foods, including:  Protein, such as meat, poultry, fish, tofu, nuts, and seeds (lean animal proteins are best).  Fruits.  Vegetables.  Dairy products, such as milk, cheese, and yogurt (low fat is best).  Breads, grains, pasta, cereal, rice, and beans.  Fats such as olive oil, trans fat-free margarine, canola oil, avocado, and olives. DOES EVERYONE WITH DIABETES MELLITUS HAVE THE SAME MEAL PLAN? Because every person with diabetes mellitus is different, there is not one meal plan that works for everyone. It is very important that you meet with a dietitian who will help you create a meal plan that is just right for you. Document Released: 03/19/2005 Document Revised: 06/27/2013 Document Reviewed: 05/19/2013 ExitCare Patient Information 2015 [employer]. This information is not intended to replace advice given to you by your health care provider. Make sure you discuss any questions you have with your health care provider.  

## 2014-04-09 NOTE — Progress Notes (Signed)
MRN: [id number] Name: Carl Vang  Sex: male Age: 53 y.o. DOB: 09-07-1960  Allergies: Review of patient's allergies indicates no known allergies.  Chief Complaint  Patient presents with  . increased urination    HPI: Patient is 53 y.o. male who comes today reported to have urinary frequency and urgency for the last several weeks denies any nausea vomiting any dysuria fever or chills, as per patient he has been drinking plenty of water, denies any week urinary stream or does not have to strain, previous blood work reviewed noticed impaired fasting glucose, patient does report family history of diabetes and hypertension, his blood pressure initially was elevated, repeat manual blood pressure is 135/80.  History reviewed. No pertinent past medical history.  Past Surgical History  Procedure Laterality Date  . Left leg below knee amputation       in accident at work       Medication List       This list is accurate as of: 04/09/14  1:09 PM.  Always use your most recent med list.               ciprofloxacin 500 MG tablet  Commonly known as:  CIPRO  Take 1 tablet (500 mg total) by mouth 2 (two) times daily.     naproxen 500 MG tablet  Commonly known as:  NAPROSYN  Take 1 tablet (500 mg total) by mouth 2 (two) times daily with a meal.        Meds ordered this encounter  Medications  . ciprofloxacin (CIPRO) 500 MG tablet    Sig: Take 1 tablet (500 mg total) by mouth 2 (two) times daily.    Dispense:  10 tablet    Refill:  0    Immunization History  Administered Date(s) Administered  . Influenza,inj,Quad PF,36+ Mos 06/16/2013, 04/09/2014    Family History  Problem Relation Age of Onset  . Diabetes Mother   . Hypertension Mother   . Cancer Mother   . Stroke Mother   . Diabetes Father   . Hypertension Father   . Diabetes Daughter   . Hypertension Daughter   . Heart disease Daughter     History  Substance Use Topics  . Smoking status: Never Smoker   .  Smokeless tobacco: Not on file  . Alcohol Use: Yes     Comment: socially     Review of Systems   As noted in HPI  Filed Vitals:   04/09/14 1031  BP: 135/80  Pulse:   Temp:   Resp:     Physical Exam  Physical Exam  Constitutional: No distress.  Eyes: EOM are normal. Pupils are equal, round, and reactive to light.  Cardiovascular: Normal rate and regular rhythm.   Pulmonary/Chest: Breath sounds normal. No respiratory distress. He has no wheezes. He has no rales.  Musculoskeletal:  Left knee BKA with prosthesis      CBC    Component Value Date/Time   WBC 8.2 06/16/2013 1516   RBC 4.65 06/16/2013 1516   HGB 13.2 06/16/2013 1516   HCT 38.6* 06/16/2013 1516   PLT 280 06/16/2013 1516   MCV 83.0 06/16/2013 1516   LYMPHSABS 4.4* 06/16/2013 1516   MONOABS 0.5 06/16/2013 1516   EOSABS 0.3 06/16/2013 1516   BASOSABS 0.0 06/16/2013 1516    CMP     Component Value Date/Time   NA 139 06/16/2013 1516   K 4.3 06/16/2013 1516   CL 103 06/16/2013 1516  CO2 30 06/16/2013 1516   GLUCOSE 104* 06/16/2013 1516   BUN 11 06/16/2013 1516   CREATININE 0.95 06/16/2013 1516   CREATININE 0.96 10/22/2007 2123   CALCIUM 9.6 06/16/2013 1516   PROT 7.1 06/16/2013 1516   ALBUMIN 4.6 06/16/2013 1516   AST 18 06/16/2013 1516   ALT 19 06/16/2013 1516   ALKPHOS 44 06/16/2013 1516   BILITOT 0.4 06/16/2013 1516   GFRNONAA >89 06/16/2013 1516   GFRNONAA >60 10/22/2007 2123   GFRAA >89 06/16/2013 1516   GFRAA  Value: >60        The eGFR has been calculated using the MDRD equation. This calculation has not been validated in all clinical 10/22/2007 2123    No results found for this basename: chol, tri, ldl    No components found with this basename: hga1c    Lab Results  Component Value Date/Time   AST 18 06/16/2013  3:16 PM    Assessment and Plan  Increased frequency of urination - Plan:  Results for orders placed in visit on 04/09/14  POCT URINALYSIS DIPSTICK      Result Value  Ref Range   Color, UA yellow     Clarity, UA clear     Glucose, UA neg     Bilirubin, UA neg     Ketones, UA neg     Spec Grav, UA 1.010     Blood, UA neg     pH, UA 5.5     Protein, UA neg     Urobilinogen, UA 0.2     Nitrite, UA neg     Leukocytes, UA small (1+)     Patient is symptomatic with LE+ve , I prescribed ciprofloxacin (CIPRO) 500 MG tablet, will send Urine culture  Elevated BP Advised patient for DASH diet  IFG (impaired fasting glucose) - Plan: Advised patient for low carbohydrate diet, recheck COMPLETE METABOLIC PANEL WITH GFR, Hemoglobin A1c  Special screening for malignant neoplasms, colon - Plan: Ambulatory referral to Gastroenterology   Encounter for immunization Flu shot given today.  Health Maintenance -Colonoscopy: Referred to GI  -Vaccinations:    -Influenza shot given today.  Return in about 3 months (around 07/10/2014) for IFG.  Lorayne Marek, MD

## 2014-04-09 NOTE — Progress Notes (Signed)
Patient complains of having urinary frequency The past couple of days

## 2014-04-11 LAB — URINE CULTURE
Colony Count: NO GROWTH
Organism ID, Bacteria: NO GROWTH

## 2014-05-24 ENCOUNTER — Encounter: Payer: Self-pay | Admitting: Internal Medicine

## 2014-05-24 ENCOUNTER — Ambulatory Visit: Payer: Medicare Other | Attending: Internal Medicine | Admitting: Internal Medicine

## 2014-05-24 VITALS — BP 128/79 | HR 86 | Temp 98.0°F | Resp 16 | Wt 169.6 lb

## 2014-05-24 DIAGNOSIS — Z791 Long term (current) use of non-steroidal anti-inflammatories (NSAID): Secondary | ICD-10-CM | POA: Insufficient documentation

## 2014-05-24 DIAGNOSIS — J320 Chronic maxillary sinusitis: Secondary | ICD-10-CM | POA: Diagnosis not present

## 2014-05-24 DIAGNOSIS — R05 Cough: Secondary | ICD-10-CM | POA: Diagnosis not present

## 2014-05-24 DIAGNOSIS — R059 Cough, unspecified: Secondary | ICD-10-CM

## 2014-05-24 DIAGNOSIS — Z89512 Acquired absence of left leg below knee: Secondary | ICD-10-CM | POA: Insufficient documentation

## 2014-05-24 DIAGNOSIS — R0981 Nasal congestion: Secondary | ICD-10-CM

## 2014-05-24 MED ORDER — FLUTICASONE PROPIONATE 50 MCG/ACT NA SUSP: 2.0000 | Freq: Every day | NASAL | Status: DC

## 2014-05-24 MED ORDER — AMOXICILLIN-POT CLAVULANATE 875-125 MG PO TABS
1.0000 | ORAL_TABLET | Freq: Two times a day (BID) | ORAL | Status: DC
Start: 2014-05-24 — End: 2016-03-24

## 2014-05-24 MED ORDER — GUAIFENESIN-DM 100-10 MG/5ML PO SYRP: 5.0000 mL | ORAL_SOLUTION | ORAL | Status: DC | PRN

## 2014-05-24 NOTE — Progress Notes (Signed)
MRN: [id number] Name: Carl Vang  Sex: male Age: 54 y.o. DOB: 08-Jul-1960  Allergies: Review of patient's allergies indicates no known allergies.  Chief Complaint  Patient presents with  . Nasal Congestion    HPI: Patient is 53 y.o. male who comes today reported to have nasal congestion postnasal drip sore throat cough fever chills for the last 3 weeks, as per patient he has tried over-the-counter medication without much improvement patient denies smoking cigarettes count denies any chest pain or shortness of breath.  History reviewed. No pertinent past medical history.  Past Surgical History  Procedure Laterality Date  . Left leg below knee amputation       in accident at work       Medication List       This list is accurate as of: 05/24/14 11:35 AM.  Always use your most recent med list.               amoxicillin-clavulanate 875-125 MG per tablet  Commonly known as:  AUGMENTIN  Take 1 tablet by mouth 2 (two) times daily.     ciprofloxacin 500 MG tablet  Commonly known as:  CIPRO  Take 1 tablet (500 mg total) by mouth 2 (two) times daily.     fluticasone 50 MCG/ACT nasal spray  Commonly known as:  FLONASE  Place 2 sprays into both nostrils daily.     guaiFENesin-dextromethorphan 100-10 MG/5ML syrup  Commonly known as:  ROBITUSSIN DM  Take 5 mLs by mouth every 4 (four) hours as needed for cough.     naproxen 500 MG tablet  Commonly known as:  NAPROSYN  Take 1 tablet (500 mg total) by mouth 2 (two) times daily with a meal.        Meds ordered this encounter  Medications  . amoxicillin-clavulanate (AUGMENTIN) 875-125 MG per tablet    Sig: Take 1 tablet by mouth 2 (two) times daily.    Dispense:  20 tablet    Refill:  0  . guaiFENesin-dextromethorphan (ROBITUSSIN DM) 100-10 MG/5ML syrup    Sig: Take 5 mLs by mouth every 4 (four) hours as needed for cough.    Dispense:  118 mL    Refill:  0  . fluticasone (FLONASE) 50 MCG/ACT nasal spray    Sig:  Place 2 sprays into both nostrils daily.    Dispense:  16 g    Refill:  2    Immunization History  Administered Date(s) Administered  . Influenza,inj,Quad PF,36+ Mos 06/16/2013, 04/09/2014    Family History  Problem Relation Age of Onset  . Diabetes Mother   . Hypertension Mother   . Cancer Mother   . Stroke Mother   . Diabetes Father   . Hypertension Father   . Diabetes Daughter   . Hypertension Daughter   . Heart disease Daughter     History  Substance Use Topics  . Smoking status: Never Smoker   . Smokeless tobacco: Not on file  . Alcohol Use: Yes     Comment: socially     Review of Systems   As noted in HPI  Filed Vitals:   05/24/14 1058  BP: 128/79  Pulse: 86  Temp: 98 F (36.7 C)  Resp: 16    Physical Exam  Physical Exam  HENT:  Nasal congestion sinus tenderness, pharyngeal erythema no exudate, both TMs visualized minimaly congested    Eyes: EOM are normal. Pupils are equal, round, and reactive to light.  Cardiovascular: Normal rate  and regular rhythm.   Pulmonary/Chest: No respiratory distress. He has no wheezes. He has no rales.  Musculoskeletal: He exhibits no edema.    CBC    Component Value Date/Time   WBC 8.2 06/16/2013 1516   RBC 4.65 06/16/2013 1516   HGB 13.2 06/16/2013 1516   HCT 38.6* 06/16/2013 1516   PLT 280 06/16/2013 1516   MCV 83.0 06/16/2013 1516   LYMPHSABS 4.4* 06/16/2013 1516   MONOABS 0.5 06/16/2013 1516   EOSABS 0.3 06/16/2013 1516   BASOSABS 0.0 06/16/2013 1516    CMP     Component Value Date/Time   NA 137 04/09/2014 1044   K 4.5 04/09/2014 1044   CL 102 04/09/2014 1044   CO2 26 04/09/2014 1044   GLUCOSE 95 04/09/2014 1044   BUN 10 04/09/2014 1044   CREATININE 0.90 04/09/2014 1044   CREATININE 0.96 10/22/2007 2123   CALCIUM 9.6 04/09/2014 1044   PROT 7.0 04/09/2014 1044   ALBUMIN 4.7 04/09/2014 1044   AST 19 04/09/2014 1044   ALT 15 04/09/2014 1044   ALKPHOS 47 04/09/2014 1044   BILITOT 0.6 04/09/2014  1044   GFRNONAA >89 04/09/2014 1044   GFRNONAA >60 10/22/2007 2123   GFRAA >89 04/09/2014 1044   GFRAA  10/22/2007 2123    >60        The eGFR has been calculated using the MDRD equation. This calculation has not been validated in all clinical    No results found for: CHOL  No components found for: HGA1C  Lab Results  Component Value Date/Time   AST 19 04/09/2014 10:44 AM    Assessment and Plan  Maxillary sinusitis, unspecified chronicity - Plan: amoxicillin-clavulanate (AUGMENTIN) 875-125 MG per tablet  Cough - Plan: guaiFENesin-dextromethorphan (ROBITUSSIN DM) 100-10 MG/5ML syrup  Nasal congestion - Plan: fluticasone (FLONASE) 50 MCG/ACT nasal spray  Also advise patient for saltwater gargles.    Return if symptoms worsen or fail to improve.  Lorayne Marek, MD

## 2014-05-24 NOTE — Progress Notes (Signed)
Patient here for cough and congestion that he has had for the past three weeks Using over the counter medications with no relief

## 2014-08-01 ENCOUNTER — Other Ambulatory Visit: Payer: Self-pay | Admitting: Gastroenterology

## 2014-08-02 ENCOUNTER — Encounter (HOSPITAL_COMMUNITY): Payer: Self-pay | Admitting: *Deleted

## 2014-08-06 ENCOUNTER — Ambulatory Visit (HOSPITAL_COMMUNITY): Payer: Medicare Other | Admitting: Anesthesiology

## 2014-08-06 ENCOUNTER — Encounter (HOSPITAL_COMMUNITY): Payer: Self-pay

## 2014-08-06 ENCOUNTER — Ambulatory Visit (HOSPITAL_COMMUNITY)
Admission: RE | Admit: 2014-08-06 | Discharge: 2014-08-06 | Disposition: A | Discharge status: Home / Self Care | Payer: Medicare Other | Source: Ambulatory Visit | Attending: Gastroenterology | Admitting: Gastroenterology

## 2014-08-06 ENCOUNTER — Encounter (HOSPITAL_COMMUNITY)
Admission: RE | Disposition: A | Discharge status: Home / Self Care | Payer: Self-pay | Source: Ambulatory Visit | Attending: Gastroenterology

## 2014-08-06 DIAGNOSIS — I10 Essential (primary) hypertension: Secondary | ICD-10-CM | POA: Diagnosis not present

## 2014-08-06 DIAGNOSIS — Z1211 Encounter for screening for malignant neoplasm of colon: Secondary | ICD-10-CM | POA: Diagnosis not present

## 2014-08-06 HISTORY — PX: COLONOSCOPY WITH PROPOFOL: SHX5780

## 2014-08-06 SURGERY — COLONOSCOPY WITH PROPOFOL
Anesthesia: Monitor Anesthesia Care

## 2014-08-06 MED ORDER — SODIUM CHLORIDE 0.9 % IV SOLN: INTRAVENOUS | Status: DC

## 2014-08-06 MED ORDER — LACTATED RINGERS IV SOLN
INTRAVENOUS | Status: DC
  Administered 2014-08-06: 1000 mL via INTRAVENOUS

## 2014-08-06 MED ORDER — PROPOFOL 10 MG/ML IV BOLUS
INTRAVENOUS | Status: DC | PRN
  Administered 2014-08-06 (×2): 50 mg via INTRAVENOUS
  Administered 2014-08-06 (×2): 25 mg via INTRAVENOUS

## 2014-08-06 MED ORDER — PROPOFOL 10 MG/ML IV BOLUS
INTRAVENOUS | Status: AC
  Filled 2014-08-06: qty 20

## 2014-08-06 MED ORDER — LACTATED RINGERS IV SOLN
INTRAVENOUS | Status: DC | PRN
  Administered 2014-08-06: 09:00:00 via INTRAVENOUS

## 2014-08-06 SURGICAL SUPPLY — 22 items

## 2014-08-06 NOTE — Op Note (Signed)
Procedure: Baseline screening colonoscopy. No family history of colon cancer.  Endoscopist: Danise EdgeMartin Telesa Jeancharles  Premedication: Propofol administered by anesthesia  Procedure: The patient was placed in the left lateral decubitus position. Anal inspection and digital rectal exam were normal. The Pentax pediatric colonoscope was introduced into the rectum and advanced to the cecum. A normal-appearing appendiceal orifice was identified. A normal-appearing ileocecal valve was intubated and the terminal ileum inspected. Colonic preparation for the exam today was good. Withdrawal time was 8 minutes  Rectum. Normal. Retroflexed view of the distal rectum normal  Sigmoid colon and descending colon. Normal  Splenic flexure. Normal  Transverse colon. Normal  Hepatic flexure. Normal  Ascending colon. Normal  Cecum and ileocecal valve. Normal  Terminal ileum. Normal  Assessment: Normal screening colonoscopy  Recommendation: Schedule repeat screening colonoscopy in 10 years

## 2014-08-06 NOTE — Anesthesia Preprocedure Evaluation (Signed)
Anesthesia Evaluation  Patient identified by MRN, date of birth, ID band Patient awake    Reviewed: Allergy & Precautions, NPO status , Patient's Chart, lab work & pertinent test results  Airway Mallampati: II  TM Distance: >3 FB Neck ROM: Full    Dental no notable dental hx.    Pulmonary neg pulmonary ROS,  breath sounds clear to auscultation  Pulmonary exam normal       Cardiovascular negative cardio ROS  Rhythm:Regular Rate:Normal     Neuro/Psych negative neurological ROS  negative psych ROS   GI/Hepatic negative GI ROS, Neg liver ROS,   Endo/Other  negative endocrine ROS  Renal/GU negative Renal ROS  negative genitourinary   Musculoskeletal negative musculoskeletal ROS (+)   Abdominal   Peds negative pediatric ROS (+)  Hematology negative hematology ROS (+)   Anesthesia Other Findings   Reproductive/Obstetrics negative OB ROS                             Anesthesia Physical Anesthesia Plan  ASA: I  Anesthesia Plan: MAC   Post-op Pain Management:    Induction: Intravenous  Airway Management Planned: Simple Face Mask  Additional Equipment:   Intra-op Plan:   Post-operative Plan:   Informed Consent: I have reviewed the patients History and Physical, chart, labs and discussed the procedure including the risks, benefits and alternatives for the proposed anesthesia with the patient or authorized representative who has indicated his/her understanding and acceptance.   Dental advisory given  Plan Discussed with: CRNA  Anesthesia Plan Comments:         Anesthesia Quick Evaluation

## 2014-08-06 NOTE — Anesthesia Postprocedure Evaluation (Signed)
  Anesthesia Post-op Note  Patient: Carl Vang  Procedure(s) Performed: Procedure(s) (LRB): COLONOSCOPY WITH PROPOFOL (N/A)  Patient Location: PACU  Anesthesia Type: MAC  Level of Consciousness: awake and alert   Airway and Oxygen Therapy: Patient Spontanous Breathing  Post-op Pain: mild  Post-op Assessment: Post-op Vital signs reviewed, Patient's Cardiovascular Status Stable, Respiratory Function Stable, Patent Airway and No signs of Nausea or vomiting  Last Vitals:  Filed Vitals:   08/06/14 1000  BP: 148/89  Pulse: 65  Temp:   Resp: 11    Post-op Vital Signs: stable   Complications: No apparent anesthesia complications

## 2014-08-06 NOTE — H&P (Signed)
  Procedure: Baseline screening colonoscopy. No family history of colon cancer.  History: The patient is a 54 year old male born 04/16/1961. He is scheduled to undergo his first screening colonoscopy with polypectomy to prevent colon cancer.  Medication allergies: None  Past medical history: Hypertension. Traumatic amputation left leg below knee  Exam: The patient is alert and lying comfortably on the endoscopy stretcher. Abdomen is soft and nontender to palpation. Lungs are clear to auscultation. Cardiac exam reveals a regular rhythm.  Plan: Proceed with baseline screening colonoscopy

## 2014-08-06 NOTE — Transfer of Care (Signed)
Immediate Anesthesia Transfer of Care Note  Patient: Carl Vang  Procedure(s) Performed: Procedure(s): COLONOSCOPY WITH PROPOFOL (N/A)  Patient Location: PACU  Anesthesia Type:MAC  Level of Consciousness: awake, sedated and patient cooperative  Airway & Oxygen Therapy: Patient Spontanous Breathing and Patient connected to face mask oxygen  Post-op Assessment: Report given to RN and Post -op Vital signs reviewed and stable  Post vital signs: Reviewed and stable  Last Vitals:  Filed Vitals:   08/06/14 0836  BP: 145/78  Pulse: 65  Temp: 36.5 C  Resp: 16    Complications: No apparent anesthesia complications

## 2014-08-07 ENCOUNTER — Encounter (HOSPITAL_COMMUNITY): Payer: Self-pay | Admitting: Gastroenterology

## 2014-12-26 ENCOUNTER — Ambulatory Visit: Payer: Medicare Other | Attending: Internal Medicine | Admitting: Internal Medicine

## 2014-12-26 ENCOUNTER — Encounter: Payer: Self-pay | Admitting: Internal Medicine

## 2014-12-26 VITALS — BP 136/76 | HR 99 | Temp 98.0°F | Resp 16 | Wt 166.0 lb

## 2014-12-26 DIAGNOSIS — Z791 Long term (current) use of non-steroidal anti-inflammatories (NSAID): Secondary | ICD-10-CM | POA: Insufficient documentation

## 2014-12-26 DIAGNOSIS — Z792 Long term (current) use of antibiotics: Secondary | ICD-10-CM | POA: Diagnosis not present

## 2014-12-26 DIAGNOSIS — M25561 Pain in right knee: Secondary | ICD-10-CM | POA: Diagnosis not present

## 2014-12-26 DIAGNOSIS — Z89512 Acquired absence of left leg below knee: Secondary | ICD-10-CM | POA: Insufficient documentation

## 2014-12-26 MED ORDER — NAPROXEN 500 MG PO TABS: 500.0000 mg | ORAL_TABLET | Freq: Two times a day (BID) | ORAL | Status: DC

## 2014-12-26 NOTE — Progress Notes (Signed)
MRN: [id number] Name: Carl Vang  Sex: male Age: 54 y.o. DOB: 1960/12/14  Allergies: Review of patient's allergies indicates no known allergies.  Chief Complaint  Patient presents with  . Knee Pain    HPI: Patient is 54 y.o. male who history of  left below knee amputation currently has prostheses, comes today complaining of right knee pain for the last 2 weeks patient denies any recent fall or trauma denies any fever chills he has been using the cane and he thinks putting a lot of  weight on his right knee is probably contributing to the symptoms  Past Medical History  Diagnosis Date  . Medical history non-contributory     Past Surgical History  Procedure Laterality Date  . Left leg below knee amputation   2003    in accident at work   . Colonoscopy with propofol N/A 08/06/2014    Procedure: COLONOSCOPY WITH PROPOFOL;  Surgeon: Garlan Fair, MD;  Location: [facility];  Service: Endoscopy;  Laterality: N/A;      Medication List       This list is accurate as of: 12/26/14  4:48 PM.  Always use your most recent med list.               amoxicillin-clavulanate 875-125 MG per tablet  Commonly known as:  AUGMENTIN  Take 1 tablet by mouth 2 (two) times daily.     ciprofloxacin 500 MG tablet  Commonly known as:  CIPRO  Take 1 tablet (500 mg total) by mouth 2 (two) times daily.     fluticasone 50 MCG/ACT nasal spray  Commonly known as:  FLONASE  Place 2 sprays into both nostrils daily.     guaiFENesin-dextromethorphan 100-10 MG/5ML syrup  Commonly known as:  ROBITUSSIN DM  Take 5 mLs by mouth every 4 (four) hours as needed for cough.     naproxen 500 MG tablet  Commonly known as:  NAPROSYN  Take 1 tablet (500 mg total) by mouth 2 (two) times daily with a meal.        Meds ordered this encounter  Medications  . naproxen (NAPROSYN) 500 MG tablet    Sig: Take 1 tablet (500 mg total) by mouth 2 (two) times daily with a meal.    Dispense:  60 tablet   Refill:  1    Immunization History  Administered Date(s) Administered  . Influenza,inj,Quad PF,36+ Mos 06/16/2013, 04/09/2014    Family History  Problem Relation Age of Onset  . Diabetes Mother   . Hypertension Mother   . Cancer Mother   . Stroke Mother   . Diabetes Father   . Hypertension Father   . Diabetes Daughter   . Hypertension Daughter   . Heart disease Daughter     History  Substance Use Topics  . Smoking status: Never Smoker   . Smokeless tobacco: Never Used  . Alcohol Use: Yes     Comment: socially     Review of Systems   As noted in HPI  Filed Vitals:   12/26/14 1430  BP: 136/76  Pulse: 99  Temp: 98 F (36.7 C)  Resp: 16    Physical Exam  Physical Exam  Cardiovascular: Normal rate and regular rhythm.   Pulmonary/Chest: Breath sounds normal. No respiratory distress. He has no wheezes. He has no rales.  Musculoskeletal:  Left BKA with prosthesis   Right knee minimal swelling and tenderness anteriorly, no erythema minimal limitation with full range of motion.  CBC    Component Value Date/Time   WBC 8.2 06/16/2013 1516   RBC 4.65 06/16/2013 1516   HGB 13.2 06/16/2013 1516   HCT 38.6* 06/16/2013 1516   PLT 280 06/16/2013 1516   MCV 83.0 06/16/2013 1516   LYMPHSABS 4.4* 06/16/2013 1516   MONOABS 0.5 06/16/2013 1516   EOSABS 0.3 06/16/2013 1516   BASOSABS 0.0 06/16/2013 1516    CMP     Component Value Date/Time   NA 137 04/09/2014 1044   K 4.5 04/09/2014 1044   CL 102 04/09/2014 1044   CO2 26 04/09/2014 1044   GLUCOSE 95 04/09/2014 1044   BUN 10 04/09/2014 1044   CREATININE 0.90 04/09/2014 1044   CREATININE 0.96 10/22/2007 2123   CALCIUM 9.6 04/09/2014 1044   PROT 7.0 04/09/2014 1044   ALBUMIN 4.7 04/09/2014 1044   AST 19 04/09/2014 1044   ALT 15 04/09/2014 1044   ALKPHOS 47 04/09/2014 1044   BILITOT 0.6 04/09/2014 1044   GFRNONAA >89 04/09/2014 1044   GFRNONAA >60 10/22/2007 2123   GFRAA >89 04/09/2014 1044   GFRAA   10/22/2007 2123    >60        The eGFR has been calculated using the MDRD equation. This calculation has not been validated in all clinical    No results found for: CHOL  Lab Results  Component Value Date/Time   HGBA1C 6.1* 04/09/2014 10:44 AM    Lab Results  Component Value Date/Time   AST 19 04/09/2014 10:44 AM    Assessment and Plan  Right knee pain - Plan: naproxen (NAPROSYN) 500 MG tablet, DG Knee Complete 4 Views Right   Return in about 3 months (around 03/28/2015), or if symptoms worsen or fail to improve.   This note has been created with Surveyor, quantity. Any transcriptional errors are unintentional.    Lorayne Marek, MD

## 2014-12-26 NOTE — Progress Notes (Signed)
Patient complains of right knee pain with some mile swelling Patient states his knee started bothering him about a week ago Patient denies any injury Patient also states that his left that was amputated a few years back has been Giving him pain as well

## 2014-12-27 ENCOUNTER — Ambulatory Visit (HOSPITAL_COMMUNITY)
Admission: RE | Admit: 2014-12-27 | Discharge: 2014-12-27 | Disposition: A | Discharge status: Home / Self Care | Payer: Medicare Other | Source: Ambulatory Visit | Attending: Internal Medicine | Admitting: Internal Medicine

## 2014-12-27 DIAGNOSIS — X58XXXA Exposure to other specified factors, initial encounter: Secondary | ICD-10-CM | POA: Insufficient documentation

## 2014-12-27 DIAGNOSIS — S83191A Other subluxation of right knee, initial encounter: Secondary | ICD-10-CM | POA: Diagnosis not present

## 2014-12-27 DIAGNOSIS — S83104A Unspecified dislocation of right knee, initial encounter: Secondary | ICD-10-CM | POA: Diagnosis not present

## 2014-12-27 DIAGNOSIS — M25461 Effusion, right knee: Secondary | ICD-10-CM | POA: Insufficient documentation

## 2014-12-27 DIAGNOSIS — M25561 Pain in right knee: Secondary | ICD-10-CM

## 2014-12-27 DIAGNOSIS — M76891 Other specified enthesopathies of right lower limb, excluding foot: Secondary | ICD-10-CM | POA: Diagnosis not present

## 2014-12-31 ENCOUNTER — Telehealth: Payer: Self-pay

## 2014-12-31 NOTE — Telephone Encounter (Signed)
Patient called returning nurses call.

## 2014-12-31 NOTE — Telephone Encounter (Signed)
Patient not available Left message with family member to have him return our call 

## 2014-12-31 NOTE — Telephone Encounter (Signed)
-----   Message from Doris Cheadleeepak Advani, MD sent at 12/27/2014 10:41 AM EDT ----- Call and let the patient know that his x-ray does not report any acute fractures but has joint effusion as well as narrowing of the joint space with subluxation of femur on tibia, recommend patient to make appointment with orthopedics, put in the referral.

## 2015-01-02 ENCOUNTER — Telehealth: Payer: Self-pay

## 2015-01-02 DIAGNOSIS — M25561 Pain in right knee: Secondary | ICD-10-CM

## 2015-01-02 NOTE — Telephone Encounter (Signed)
Returned patient phone call Patient not available  

## 2015-01-02 NOTE — Telephone Encounter (Signed)
Patient returned phone call and is aware of his x ray results Referral to ortho placed in epic and patient is aware he will Be notified by them to schedule and appointment

## 2015-01-14 ENCOUNTER — Ambulatory Visit: Payer: Medicare Other | Attending: Internal Medicine | Admitting: Internal Medicine

## 2015-01-14 ENCOUNTER — Encounter: Payer: Self-pay | Admitting: Internal Medicine

## 2015-01-14 VITALS — BP 125/80 | HR 92 | Temp 98.0°F | Resp 16 | Wt 168.8 lb

## 2015-01-14 DIAGNOSIS — Z89512 Acquired absence of left leg below knee: Secondary | ICD-10-CM | POA: Insufficient documentation

## 2015-01-14 DIAGNOSIS — Z79899 Other long term (current) drug therapy: Secondary | ICD-10-CM | POA: Insufficient documentation

## 2015-01-14 DIAGNOSIS — M25561 Pain in right knee: Secondary | ICD-10-CM | POA: Diagnosis not present

## 2015-01-14 DIAGNOSIS — S88112S Complete traumatic amputation at level between knee and ankle, left lower leg, sequela: Secondary | ICD-10-CM | POA: Diagnosis not present

## 2015-01-14 MED ORDER — TRAMADOL HCL 50 MG PO TABS: 50.0000 mg | ORAL_TABLET | Freq: Every day | ORAL | Status: DC | PRN

## 2015-01-14 NOTE — Progress Notes (Signed)
MRN: [id number] Name: Carl Vang  Sex: male Age: 54 y.o. DOB: 1961/07/02  Allergies: Review of patient's allergies indicates no known allergies.  Chief Complaint  Patient presents with  . Knee Pain    HPI: Patient is 54 y.o. male who comes today still complaining of right knee pain, he had x-ray done which reported IMPRESSION: No acute fracture. There is narrowing of medial joint compartment. There is about 6.6 mm medial subluxation distal femur on tibial plateau. Narrowing of patellofemoral joint space. Spurring of patella. Small to moderate joint effusion.  patient is taking naproxen for the pain, also he has already been scheduled appointment with sports medicine, he also history of left below-knee amputation currently has prostheses   Past Medical History  Diagnosis Date  . Medical history non-contributory     Past Surgical History  Procedure Laterality Date  . Left leg below knee amputation   2003    in accident at work   . Colonoscopy with propofol N/A 08/06/2014    Procedure: COLONOSCOPY WITH PROPOFOL;  Surgeon: Garlan Fair, MD;  Location: [facility];  Service: Endoscopy;  Laterality: N/A;      Medication List       This list is accurate as of: 01/14/15 12:35 PM.  Always use your most recent med list.               amoxicillin-clavulanate 875-125 MG per tablet  Commonly known as:  AUGMENTIN  Take 1 tablet by mouth 2 (two) times daily.     ciprofloxacin 500 MG tablet  Commonly known as:  CIPRO  Take 1 tablet (500 mg total) by mouth 2 (two) times daily.     fluticasone 50 MCG/ACT nasal spray  Commonly known as:  FLONASE  Place 2 sprays into both nostrils daily.     guaiFENesin-dextromethorphan 100-10 MG/5ML syrup  Commonly known as:  ROBITUSSIN DM  Take 5 mLs by mouth every 4 (four) hours as needed for cough.     naproxen 500 MG tablet  Commonly known as:  NAPROSYN  Take 1 tablet (500 mg total) by mouth 2 (two) times daily with a meal.       traMADol 50 MG tablet  Commonly known as:  ULTRAM  Take 1 tablet (50 mg total) by mouth daily as needed for moderate pain.        Meds ordered this encounter  Medications  . traMADol (ULTRAM) 50 MG tablet    Sig: Take 1 tablet (50 mg total) by mouth daily as needed for moderate pain.    Dispense:  30 tablet    Refill:  0    Immunization History  Administered Date(s) Administered  . Influenza,inj,Quad PF,36+ Mos 06/16/2013, 04/09/2014    Family History  Problem Relation Age of Onset  . Diabetes Mother   . Hypertension Mother   . Cancer Mother   . Stroke Mother   . Diabetes Father   . Hypertension Father   . Diabetes Daughter   . Hypertension Daughter   . Heart disease Daughter     History  Substance Use Topics  . Smoking status: Never Smoker   . Smokeless tobacco: Never Used  . Alcohol Use: Yes     Comment: socially     Review of Systems   As noted in HPI  Filed Vitals:   01/14/15 1210  BP: 125/80  Pulse: 92  Temp: 98 F (36.7 C)  Resp: 16    Physical Exam  Physical  Exam  Cardiovascular: Normal rate and regular rhythm.   Pulmonary/Chest: Breath sounds normal. No respiratory distress. He has no wheezes. He has no rales.  Musculoskeletal:  Right knee minimal swelling and tenderness anteriorly, no erythema minimal limitation with full range of motion.   Left BKA with prosthesis     CBC    Component Value Date/Time   WBC 8.2 06/16/2013 1516   RBC 4.65 06/16/2013 1516   HGB 13.2 06/16/2013 1516   HCT 38.6* 06/16/2013 1516   PLT 280 06/16/2013 1516   MCV 83.0 06/16/2013 1516   LYMPHSABS 4.4* 06/16/2013 1516   MONOABS 0.5 06/16/2013 1516   EOSABS 0.3 06/16/2013 1516   BASOSABS 0.0 06/16/2013 1516    CMP     Component Value Date/Time   NA 137 04/09/2014 1044   K 4.5 04/09/2014 1044   CL 102 04/09/2014 1044   CO2 26 04/09/2014 1044   GLUCOSE 95 04/09/2014 1044   BUN 10 04/09/2014 1044   CREATININE 0.90 04/09/2014 1044    CREATININE 0.96 10/22/2007 2123   CALCIUM 9.6 04/09/2014 1044   PROT 7.0 04/09/2014 1044   ALBUMIN 4.7 04/09/2014 1044   AST 19 04/09/2014 1044   ALT 15 04/09/2014 1044   ALKPHOS 47 04/09/2014 1044   BILITOT 0.6 04/09/2014 1044   GFRNONAA >89 04/09/2014 1044   GFRNONAA >60 10/22/2007 2123   GFRAA >89 04/09/2014 1044   GFRAA  10/22/2007 2123    >60        The eGFR has been calculated using the MDRD equation. This calculation has not been validated in all clinical    No results found for: CHOL  Lab Results  Component Value Date/Time   HGBA1C 6.1* 04/09/2014 10:44 AM    Lab Results  Component Value Date/Time   AST 19 04/09/2014 10:44 AM    Assessment and Plan  Right knee pain - Plan: her patient is taking naproxen, prescribed traMADol (ULTRAM) 50 MG tablet take only for severe pain, patient is scheduled to follow  with sports medicine  Traumatic amputation of left leg below knee, Left BKA with prosthesis   Return in about 3 months (around 04/16/2015), or if symptoms worsen or fail to improve.   This note has been created with Surveyor, quantity. Any transcriptional errors are unintentional.    Lorayne Marek, MD

## 2015-01-14 NOTE — Progress Notes (Signed)
Patient here for follow up to the pain to his right knee Patient did  State it will require surgery in the future Patient [facility]

## 2015-01-16 DIAGNOSIS — M1711 Unilateral primary osteoarthritis, right knee: Secondary | ICD-10-CM | POA: Diagnosis not present

## 2015-01-21 ENCOUNTER — Telehealth: Payer: Self-pay | Admitting: Internal Medicine

## 2015-01-21 NOTE — Telephone Encounter (Signed)
Pt's potential home aide calling to follow upon [facility]  [facility], paperwork was missing pertinent information and needs to be updated and sent back to them for pt to receive home care. Please f/u with pt or soon to be aide.

## 2015-01-22 ENCOUNTER — Ambulatory Visit (INDEPENDENT_AMBULATORY_CARE_PROVIDER_SITE_OTHER): Payer: Medicare Other | Admitting: Family Medicine

## 2015-01-22 VITALS — BP 124/70 | Ht 65.0 in | Wt 165.0 lb

## 2015-01-22 DIAGNOSIS — M1711 Unilateral primary osteoarthritis, right knee: Secondary | ICD-10-CM

## 2015-01-22 DIAGNOSIS — M25561 Pain in right knee: Secondary | ICD-10-CM

## 2015-01-22 MED ORDER — METHYLPREDNISOLONE ACETATE 40 MG/ML IJ SUSP
40.0000 mg | Freq: Once | INTRAMUSCULAR | Status: AC
Start: 2015-01-22 — End: 2015-01-22
  Administered 2015-01-22: 40 mg via INTRA_ARTICULAR

## 2015-01-22 NOTE — Telephone Encounter (Signed)
Patient came into office requesting paperwork status, [facility] ,please f/u with patient

## 2015-01-23 DIAGNOSIS — M25561 Pain in right knee: Secondary | ICD-10-CM | POA: Insufficient documentation

## 2015-01-23 DIAGNOSIS — M1711 Unilateral primary osteoarthritis, right knee: Secondary | ICD-10-CM | POA: Insufficient documentation

## 2015-01-23 NOTE — Assessment & Plan Note (Signed)
Primary OA of right knee, most likely tricompartmental.  Likely has put increased stress on right knee following left BKA in '03. No help with oral pain medications.  - Intraarticular corticosteroid injection performed today.  - Given double upright brace to help with stability.  - PT Rx for [facility].  - follow up PRN depending on pain level.

## 2015-01-23 NOTE — Progress Notes (Signed)
Carl Vang - 54 y.o. male MRN [id number]  Date of birth: 10-09-1960  CC: Right knee pain  SUBJECTIVE:   HPI  Carl Vang is a 54 yo male with a 6 month history of right knee pain.  He had a left BKA in '03 after an accident at work.  In terms of his right knee pain he reports worsening pain over the last month especially.  He has fallen on it occasion, usually in the morning before his nursing aid arrives.  The pain is worst in the evenings.  He reports no help with tramadol or naproxen.  The knee also swells and gives out from time to time.  He does not feel his knee is stable.  He denies any fevers, chills, or night sweats.  He reports no specific episode of trauma leading to the pain. He denies ever having an injection into that knee.   ROS:     Negative other than that in HPI.   HISTORY: Past Medical, Surgical, Social, and Family History Reviewed & Updated per EMR.  Pertinent Historical Findings include:impaired glucose tolerance and history of left BKA.  OBJECTIVE: BP 124/70 mmHg  Ht  (1.651 m)  Wt 165 lb (74.844 kg)  BMI 27.46 kg/m2  Physical Exam  General: calm, no distress sitting on exam table. Respiratory: non-labored breathing Vasc: DP 2+ on Right side Skin: warm, dry with no lesions distal to right knee.  MSK:  Right Knee: Normal to inspection with no erythema or effusion or obvious bony abnormalities. Palpation normal with no warmth or joint line tenderness or patellar tenderness. Minimally tender medially on femoral condyle and joint line.  ROM limited in flexion (5) and extension (90) Ligaments with solid consistent endpoints including ACL, PCL, LCL, MCL. Negative Mcmurray's and provocative meniscal tests. Painful patellar compression. Minimal patellar laxity. Patellar and quadriceps tendons unremarkable. Patient had trouble relaxing.  Hamstring and quadriceps strength is normal.  Left Knee: BKA.  Did not examine other than visual inspection, which was unremarkable.      X-rays: Personally reviewed images today from 12/27/2014. There is about 6.6 mm medial subluxation of the distal femur on the tibial plateau. Additionally there appears to be tricompartmental joint space disease, most profound in the patellofemoral joint space.  The medial and lateral joint spaces are difficult to assess as it is not apparent from the report if the images are standing or supine.    Procedure: Consent obtained and verified. Sterile betadine prep. Furthur cleansed with alcohol. Topical analgesic spray: Ethyl chloride. Joint: right knee Approached in typical fashion with: anterolateral Completed without difficulty Meds: 1cc of /mL of depomedrol & 3cc of 1% xylocaine Needle: 25 g 1.5" Aftercare instructions and Red flags advised.   MEDICATIONS, LABS & OTHER ORDERS: Previous Medications   AMOXICILLIN-CLAVULANATE (AUGMENTIN) 875-125 MG PER TABLET    Take 1 tablet by mouth 2 (two) times daily.   CIPROFLOXACIN (CIPRO) 500 MG TABLET    Take 1 tablet (500 mg total) by mouth 2 (two) times daily.   FLUTICASONE (FLONASE) 50 MCG/ACT NASAL SPRAY    Place 2 sprays into both nostrils daily.   GUAIFENESIN-DEXTROMETHORPHAN (ROBITUSSIN DM) 100-10 MG/5ML SYRUP    Take 5 mLs by mouth every 4 (four) hours as needed for cough.   NAPROXEN (NAPROSYN) 500 MG TABLET    Take 1 tablet (500 mg total) by mouth 2 (two) times daily with a meal.   TRAMADOL (ULTRAM) 50 MG TABLET    Take 1 tablet (50  mg total) by mouth daily as needed for moderate pain.   Modified Medications   No medications on file   New Prescriptions   No medications on file   Discontinued Medications   No medications on file   Orders Placed This Encounter  Procedures  . Ambulatory referral to Physical Therapy   ASSESSMENT & PLAN: See problem based charting & AVS for pt instructions.

## 2015-01-25 DIAGNOSIS — M25561 Pain in right knee: Secondary | ICD-10-CM | POA: Diagnosis not present

## 2015-01-25 DIAGNOSIS — Z96652 Presence of left artificial knee joint: Secondary | ICD-10-CM | POA: Diagnosis not present

## 2015-01-25 DIAGNOSIS — Z79891 Long term (current) use of opiate analgesic: Secondary | ICD-10-CM | POA: Diagnosis not present

## 2015-03-05 ENCOUNTER — Ambulatory Visit: Payer: Medicare Other | Admitting: Family Medicine

## 2015-03-08 DIAGNOSIS — M1711 Unilateral primary osteoarthritis, right knee: Secondary | ICD-10-CM | POA: Diagnosis not present

## 2015-03-12 ENCOUNTER — Ambulatory Visit: Payer: Medicare Other | Admitting: Family Medicine

## 2015-03-14 DIAGNOSIS — H524 Presbyopia: Secondary | ICD-10-CM | POA: Diagnosis not present

## 2015-03-14 DIAGNOSIS — H2513 Age-related nuclear cataract, bilateral: Secondary | ICD-10-CM | POA: Diagnosis not present

## 2015-03-14 DIAGNOSIS — Z79891 Long term (current) use of opiate analgesic: Secondary | ICD-10-CM | POA: Diagnosis not present

## 2015-04-11 DIAGNOSIS — Z79891 Long term (current) use of opiate analgesic: Secondary | ICD-10-CM | POA: Diagnosis not present

## 2015-06-04 ENCOUNTER — Ambulatory Visit (INDEPENDENT_AMBULATORY_CARE_PROVIDER_SITE_OTHER): Payer: Medicare Other | Admitting: Family Medicine

## 2015-06-04 ENCOUNTER — Encounter: Payer: Self-pay | Admitting: Family Medicine

## 2015-06-04 VITALS — BP 120/68 | Ht 65.0 in | Wt 170.0 lb

## 2015-06-04 DIAGNOSIS — M1711 Unilateral primary osteoarthritis, right knee: Secondary | ICD-10-CM | POA: Diagnosis present

## 2015-06-04 MED ORDER — METHYLPREDNISOLONE ACETATE 80 MG/ML IJ SUSP
80.0000 mg | Freq: Once | INTRAMUSCULAR | Status: AC
  Administered 2015-06-04: 80 mg via INTRA_ARTICULAR

## 2015-06-04 NOTE — Progress Notes (Signed)
Carl Vang - 54 y.o. male MRN [id number]  Date of birth: 01/26/1961  CC: right knee pain  SUBJECTIVE:   HPI Carl Vang is a 54 yo male with a 12 month history of right knee pain. He had a left BKA in '03 after an accident at work. -  In terms of his right knee pain he reports worsening pain over the last month especially.  - He has fallen on it occasionally, usually in the morning before his nursing aid arrives.  - The pain is worst in the evenings. -  He reports no help with tramadol or naproxen.  - The knee also swells and gives out from time to time.  - He denies any fevers, chills, or night sweats.  - He reports no specific episode of trauma leading to the pain.  - He was last seen on 01/22/2015 and reports 3+ months of complete pain relief that only returned 2-3 weeks ago.  This had been his first knee injection.   ROS:     Negative other than that listed in HPI.   HISTORY: Past Medical, Surgical, Social, and Family History Reviewed & Updated per EMR.  Pertinent Historical Findings include: Impaired glucose tolerance and history of left BKA.  No changes since last visit.   OBJECTIVE: BP 120/68 mmHg  Ht 5\' 5"  (1.651 m)  Wt 170 lb (77.111 kg)  BMI 28.29 kg/m2  Physical Exam  General: calm, no distress sitting on exam table. Respiratory: non-labored breathing Vasc: DP 2+ on Right side Skin: warm, dry with no lesions distal to right knee.  MSK:  Right Knee: Normal to inspection with no erythema or effusion or obvious bony abnormalities. Palpation normal with minimal joint line tenderness and no patellar tenderness. Minimally tender medially on femoral condyle and joint line.  ROM limited in flexion (5) and extension (90) Ligaments with solid consistent endpoints including ACL, PCL, LCL, MCL. Negative Mcmurray's and provocative meniscal tests. Painful patellar compression. Minimal patellar laxity. Patellar and quadriceps tendons unremarkable. Patient had trouble relaxing.    Hamstring and quadriceps strength is normal.  Left Knee: BKA. Did not examine other than visual inspection, which was unremarkable.   Procedure: Consent obtained and verified. Sterile betadine prep. Furthur cleansed with alcohol. Topical analgesic spray: Ethyl chloride. Joint: right knee Approached in typical fashion with: anterolateral Completed without difficulty Meds: 1cc of 80mg /mL of depomedrol & 4cc of 1% xylocaine Needle: 25 g 1.5" Aftercare instructions and Red flags advised.  MEDICATIONS, LABS & OTHER ORDERS: Previous Medications   AMOXICILLIN-CLAVULANATE (AUGMENTIN) 875-125 MG PER TABLET    Take 1 tablet by mouth 2 (two) times daily.   CIPROFLOXACIN (CIPRO) 500 MG TABLET    Take 1 tablet (500 mg total) by mouth 2 (two) times daily.   FLUTICASONE (FLONASE) 50 MCG/ACT NASAL SPRAY    Place 2 sprays into both nostrils daily.   GUAIFENESIN-DEXTROMETHORPHAN (ROBITUSSIN DM) 100-10 MG/5ML SYRUP    Take 5 mLs by mouth every 4 (four) hours as needed for cough.   NAPROXEN (NAPROSYN) 500 MG TABLET    Take 1 tablet (500 mg total) by mouth 2 (two) times daily with a meal.   OXYCODONE-ACETAMINOPHEN (PERCOCET/ROXICET) 5-325 MG TABLET    Take 1 tablet by mouth 2 (two) times daily as needed. for pain   TRAMADOL (ULTRAM) 50 MG TABLET    Take 1 tablet (50 mg total) by mouth daily as needed for moderate pain.   Modified Medications   No medications on file  New Prescriptions   No medications on file   Discontinued Medications   No medications on file  No orders of the defined types were placed in this encounter.   ASSESSMENT & PLAN: See problem based charting & AVS for pt instructions.

## 2015-06-05 NOTE — Assessment & Plan Note (Signed)
Primary OA of right knee. Tricompartmental.  Contralateral left BKA in '03.  No help with oral pain medications, but complete relief of pain for 3+ months after last injection.  - - - - Desires second injection today, which was successfully performed. .  - Recommended continued use of his brace if he feels unstable.  - f/u prn.

## 2015-07-03 ENCOUNTER — Ambulatory Visit: Payer: Medicare Other | Admitting: Internal Medicine

## 2015-09-17 ENCOUNTER — Ambulatory Visit (INDEPENDENT_AMBULATORY_CARE_PROVIDER_SITE_OTHER): Payer: Medicare Other | Admitting: Family Medicine

## 2015-09-17 ENCOUNTER — Encounter: Payer: Self-pay | Admitting: Family Medicine

## 2015-09-17 VITALS — BP 122/71 | Ht 65.0 in | Wt 165.0 lb

## 2015-09-17 DIAGNOSIS — M1711 Unilateral primary osteoarthritis, right knee: Secondary | ICD-10-CM

## 2015-09-17 MED ORDER — METHYLPREDNISOLONE ACETATE 40 MG/ML IJ SUSP
40.0000 mg | Freq: Once | INTRAMUSCULAR | Status: AC
Start: 2015-09-17 — End: 2015-09-17
  Administered 2015-09-17: 40 mg via INTRA_ARTICULAR

## 2015-09-18 NOTE — Progress Notes (Signed)
Carl Vang - 55 y.o. male MRN [id number]  Date of birth: [birth year]  CC: right knee pain  SUBJECTIVE:   HPI Carl Vang is a 55 yo male with a over a year history of right knee pain. He had a left BKA in '03 after an accident at work. - Has had great results from the last few injections. --He was last seen on 06/04/2015 and had great relief until several weeks ago. -- he has had 2 injections in the last year. - The pain is worst in the evenings. -  He reports no help with tramadol or naproxen.   - He denies any fevers, chills, or night sweats.  - He reports no specific episode of trauma leading to the pain.   ROS:     Negative other than that listed in HPI. No joint swelling or redness.  No locking or catching.  No new rashes.  HISTORY: Past Medical, Surgical, Social, and Family History Reviewed & Updated per EMR.  Pertinent Historical Findings include: Impaired glucose tolerance and history of left BKA.  No changes since last visit.   OBJECTIVE: BP 122/71 mmHg  Ht  (1.651 m)  Wt 165 lb (74.844 kg)  BMI 27.46 kg/m2  Physical Exam  General: calm, no distress sitting on exam table. Respiratory: non-labored breathing Vasc: DP 2+ on Right side Skin: warm, dry with no lesions distal to right knee.  MSK:  Right Knee: Normal to inspection with no erythema or effusion or obvious bony abnormalities. Palpation normal with minimal joint line tenderness and no patellar tenderness. Minimally tender medially on femoral condyle and joint line.  ROM limited in flexion (5) and extension (90) Ligaments with solid consistent endpoints including ACL, PCL, LCL, MCL. Negative Mcmurray's and provocative meniscal tests. Painful patellar compression. Minimal patellar laxity. Patellar and quadriceps tendons unremarkable. Patient had trouble relaxing.  Hamstring and quadriceps strength is normal.  Left Knee: BKA. Did not examine other than visual inspection, which was unremarkable.    Procedure: Consent obtained and verified. Sterile betadine prep. Furthur cleansed with alcohol. Topical analgesic spray: Ethyl chloride. Joint: right knee Approached in typical fashion with: anterolateral Completed without difficulty Meds: 1cc of /mL of depomedrol & 4cc of 1% xylocaine Needle: 25 g 1.5" Aftercare instructions and Red flags advised.  MEDICATIONS, LABS & OTHER ORDERS: Previous Medications   AMOXICILLIN-CLAVULANATE (AUGMENTIN) 875-125 MG PER TABLET    Take 1 tablet by mouth 2 (two) times daily.   CIPROFLOXACIN (CIPRO) 500 MG TABLET    Take 1 tablet (500 mg total) by mouth 2 (two) times daily.   FLUTICASONE (FLONASE) 50 MCG/ACT NASAL SPRAY    Place 2 sprays into both nostrils daily.   GUAIFENESIN-DEXTROMETHORPHAN (ROBITUSSIN DM) 100-10 MG/5ML SYRUP    Take 5 mLs by mouth every 4 (four) hours as needed for cough.   HYDROCODONE-ACETAMINOPHEN (NORCO) 10-325 MG TABLET    Take 1 tablet by mouth every 4 (four) hours as needed. for pain   NAPROXEN (NAPROSYN) 500 MG TABLET    Take 1 tablet (500 mg total) by mouth 2 (two) times daily with a meal.   OXYCODONE-ACETAMINOPHEN (PERCOCET/ROXICET) 5-325 MG TABLET    Take 1 tablet by mouth 2 (two) times daily as needed. for pain   TRAMADOL (ULTRAM) 50 MG TABLET    Take 1 tablet (50 mg total) by mouth daily as needed for moderate pain.   Modified Medications   No medications on file   New Prescriptions   No medications on file  Discontinued Medications   No medications on file  No orders of the defined types were placed in this encounter.   ASSESSMENT & PLAN: Primary osteoarthritis of the right knee:Tricompartmental. Contralateral left BKA in 2003. To help with oral medications.  2 knee injections within the last 12 months most recently just over 3 months ago. He wears his knee brace off and on.we recommended he continue to wear his brace as much as needed.  He should try oral pain medications such as Tylenol or naproxen in order  to extend the time between injections as much as possible. We will see him back on an as-needed basis. We are glad these injections are helping him so much.

## 2015-10-24 ENCOUNTER — Emergency Department (HOSPITAL_COMMUNITY)
Admission: EM | Admit: 2015-10-24 | Discharge: 2015-10-24 | Disposition: A | Discharge status: Home / Self Care | Payer: Medicare Other | Attending: Emergency Medicine | Admitting: Emergency Medicine

## 2015-10-24 ENCOUNTER — Encounter (HOSPITAL_COMMUNITY): Payer: Self-pay

## 2015-10-24 ENCOUNTER — Emergency Department (HOSPITAL_COMMUNITY): Payer: Medicare Other

## 2015-10-24 DIAGNOSIS — R112 Nausea with vomiting, unspecified: Secondary | ICD-10-CM

## 2015-10-24 DIAGNOSIS — R0981 Nasal congestion: Secondary | ICD-10-CM

## 2015-10-24 DIAGNOSIS — Z79899 Other long term (current) drug therapy: Secondary | ICD-10-CM | POA: Insufficient documentation

## 2015-10-24 DIAGNOSIS — R05 Cough: Secondary | ICD-10-CM | POA: Insufficient documentation

## 2015-10-24 DIAGNOSIS — R059 Cough, unspecified: Secondary | ICD-10-CM

## 2015-10-24 DIAGNOSIS — R509 Fever, unspecified: Secondary | ICD-10-CM | POA: Diagnosis not present

## 2015-10-24 DIAGNOSIS — R51 Headache: Secondary | ICD-10-CM | POA: Insufficient documentation

## 2015-10-24 DIAGNOSIS — J029 Acute pharyngitis, unspecified: Secondary | ICD-10-CM | POA: Insufficient documentation

## 2015-10-24 DIAGNOSIS — Z792 Long term (current) use of antibiotics: Secondary | ICD-10-CM | POA: Insufficient documentation

## 2015-10-24 DIAGNOSIS — R197 Diarrhea, unspecified: Secondary | ICD-10-CM | POA: Insufficient documentation

## 2015-10-24 DIAGNOSIS — E860 Dehydration: Secondary | ICD-10-CM | POA: Diagnosis not present

## 2015-10-24 LAB — COMPREHENSIVE METABOLIC PANEL
ALT: 27 U/L (ref 17–63)
AST: 27 U/L (ref 15–41)
Albumin: 4.4 g/dL (ref 3.5–5.0)
Alkaline Phosphatase: 50 U/L (ref 38–126)
Anion gap: 11 (ref 5–15)
BUN: 12 mg/dL (ref 6–20)
CO2: 28 mmol/L (ref 22–32)
Calcium: 9.2 mg/dL (ref 8.9–10.3)
Chloride: 98 mmol/L — ABNORMAL LOW (ref 101–111)
Creatinine, Ser: 1.07 mg/dL (ref 0.61–1.24)
GFR calc Af Amer: >60 mL/min (ref >60)
GFR calc non Af Amer: >60 mL/min (ref >60)
Glucose, Bld: 105 mg/dL — ABNORMAL HIGH (ref 65–99)
Potassium: 4.2 mmol/L (ref 3.5–5.1)
Sodium: 137 mmol/L (ref 135–145)
Total Bilirubin: 0.9 mg/dL (ref 0.3–1.2)
Total Protein: 7.4 g/dL (ref 6.5–8.1)

## 2015-10-24 LAB — CBC
HCT: 39.1 % (ref 39.0–52.0)
Hemoglobin: 13.3 g/dL (ref 13.0–17.0)
MCH: 28.5 pg (ref 26.0–34.0)
MCHC: 34 g/dL (ref 30.0–36.0)
MCV: 83.9 fL (ref 78.0–100.0)
Platelets: 211 10*3/uL (ref 150–400)
RBC: 4.66 MIL/uL (ref 4.22–5.81)
RDW: 12.9 % (ref 11.5–15.5)
WBC: 7.9 10*3/uL (ref 4.0–10.5)

## 2015-10-24 LAB — URINALYSIS, ROUTINE W REFLEX MICROSCOPIC
Bilirubin Urine: NEGATIVE
Glucose, UA: NEGATIVE mg/dL
Hgb urine dipstick: NEGATIVE
Ketones, ur: NEGATIVE mg/dL
Leukocytes, UA: NEGATIVE
Nitrite: NEGATIVE
Protein, ur: NEGATIVE mg/dL
Specific Gravity, Urine: 1.024 (ref 1.005–1.030)
pH: 6 (ref 5.0–8.0)

## 2015-10-24 LAB — LIPASE, BLOOD: Lipase: 22 U/L (ref 11–51)

## 2015-10-24 MED ORDER — BENZONATATE 100 MG PO CAPS: 100.0000 mg | ORAL_CAPSULE | Freq: Three times a day (TID) | ORAL | Status: DC

## 2015-10-24 MED ORDER — ACETAMINOPHEN 325 MG PO TABS
650.0000 mg | ORAL_TABLET | Freq: Once | ORAL | Status: AC
  Administered 2015-10-24: 650 mg via ORAL
  Filled 2015-10-24: qty 2

## 2015-10-24 MED ORDER — SODIUM CHLORIDE 0.9 % IV BOLUS (SEPSIS)
1000.0000 mL | Freq: Once | INTRAVENOUS | Status: AC
  Administered 2015-10-24: 1000 mL via INTRAVENOUS

## 2015-10-24 MED ORDER — ONDANSETRON HCL 4 MG/2ML IJ SOLN
4.0000 mg | Freq: Once | INTRAMUSCULAR | Status: AC
  Administered 2015-10-24: 4 mg via INTRAVENOUS
  Filled 2015-10-24: qty 2

## 2015-10-24 MED ORDER — ONDANSETRON HCL 4 MG PO TABS: 4.0000 mg | ORAL_TABLET | Freq: Three times a day (TID) | ORAL | Status: DC | PRN

## 2015-10-24 NOTE — Discharge Instructions (Signed)
Medications: Zofran, Tessalon  Treatment: Take Zofran every 6 hours as needed for nausea and vomiting. Take Tessalon every 8 hours as needed for cough. Continue taking Zyrtec once daily at home. Stop taking the amoxicillin.  Follow-up: Please follow-up with your primary care provider in 2-3 days for reevaluation of your symptoms. Please see your doctor or return to emergency department if your symptoms worsen, or you develop any new or concerning symptoms.   Cough, Adult Coughing is a reflex that clears your throat and your airways. Coughing helps to heal and protect your lungs. It is normal to cough occasionally, but a cough that happens with other symptoms or lasts a long time may be a sign of a condition that needs treatment. A cough may last only 2-3 weeks (acute), or it may last longer than 8 weeks (chronic). CAUSES Coughing is commonly caused by:  Breathing in substances that irritate your lungs.  A viral or bacterial respiratory infection.  Allergies.  Asthma.  Postnasal drip.  Smoking.  Acid backing up from the stomach into the esophagus (gastroesophageal reflux).  Certain medicines.  Chronic lung problems, including COPD (or rarely, lung cancer).  Other medical conditions such as heart failure. HOME CARE INSTRUCTIONS  Pay attention to any changes in your symptoms. Take these actions to help with your discomfort:  Take medicines only as told by your health care provider.  If you were prescribed an antibiotic medicine, take it as told by your health care provider. Do not stop taking the antibiotic even if you start to feel better.  Talk with your health care provider before you take a cough suppressant medicine.  Drink enough fluid to keep your urine clear or pale yellow.  If the air is dry, use a cold steam vaporizer or humidifier in your bedroom or your home to help loosen secretions.  Avoid anything that causes you to cough at work or at home.  If your cough is  worse at night, try sleeping in a semi-upright position.  Avoid cigarette smoke. If you smoke, quit smoking. If you need help quitting, ask your health care provider.  Avoid caffeine.  Avoid alcohol.  Rest as needed. SEEK MEDICAL CARE IF:   You have new symptoms.  You cough up pus.  Your cough does not get better after 2-3 weeks, or your cough gets worse.  You cannot control your cough with suppressant medicines and you are losing sleep.  You develop pain that is getting worse or pain that is not controlled with pain medicines.  You have a fever.  You have unexplained weight loss.  You have night sweats. SEEK IMMEDIATE MEDICAL CARE IF:  You cough up blood.  You have difficulty breathing.  Your heartbeat is very fast.   This information is not intended to replace advice given to you by your health care provider. Make sure you discuss any questions you have with your health care provider.   Document Released: 12/19/2010 Document Revised: 03/13/2015 Document Reviewed: 08/29/2014 Elsevier Interactive Patient Education 2016 [employer]  Nausea and Vomiting Nausea is a sick feeling that often comes before throwing up (vomiting). Vomiting is a reflex where stomach contents come out of your mouth. Vomiting can cause severe loss of body fluids (dehydration). Children and elderly adults can become dehydrated quickly, especially if they also have diarrhea. Nausea and vomiting are symptoms of a condition or disease. It is important to find the cause of your symptoms. CAUSES   Direct irritation of the stomach lining.  This irritation can result from increased acid production (gastroesophageal reflux disease), infection, food poisoning, taking certain medicines (such as nonsteroidal anti-inflammatory drugs), alcohol use, or tobacco use.  Signals from the brain.These signals could be caused by a headache, heat exposure, an inner ear disturbance, increased pressure in the brain from  injury, infection, a tumor, or a concussion, pain, emotional stimulus, or metabolic problems.  An obstruction in the gastrointestinal tract (bowel obstruction).  Illnesses such as diabetes, hepatitis, gallbladder problems, appendicitis, kidney problems, cancer, sepsis, atypical symptoms of a heart attack, or eating disorders.  Medical treatments such as chemotherapy and radiation.  Receiving medicine that makes you sleep (general anesthetic) during surgery. DIAGNOSIS Your caregiver may ask for tests to be done if the problems do not improve after a few days. Tests may also be done if symptoms are severe or if the reason for the nausea and vomiting is not clear. Tests may include:  Urine tests.  Blood tests.  Stool tests.  Cultures (to look for evidence of infection).  X-rays or other imaging studies. Test results can help your caregiver make decisions about treatment or the need for additional tests. TREATMENT You need to stay well hydrated. Drink frequently but in small amounts.You may wish to drink water, sports drinks, clear broth, or eat frozen ice pops or gelatin dessert to help stay hydrated.When you eat, eating slowly may help prevent nausea.There are also some antinausea medicines that may help prevent nausea. HOME CARE INSTRUCTIONS   Take all medicine as directed by your caregiver.  If you do not have an appetite, do not force yourself to eat. However, you must continue to drink fluids.  If you have an appetite, eat a normal diet unless your caregiver tells you differently.  Eat a variety of complex carbohydrates (rice, wheat, potatoes, bread), lean meats, yogurt, fruits, and vegetables.  Avoid high-fat foods because they are more difficult to digest.  Drink enough water and fluids to keep your urine clear or pale yellow.  If you are dehydrated, ask your caregiver for specific rehydration instructions. Signs of dehydration may include:  Severe thirst.  Dry lips  and mouth.  Dizziness.  Dark urine.  Decreasing urine frequency and amount.  Confusion.  Rapid breathing or pulse. SEEK IMMEDIATE MEDICAL CARE IF:   You have blood or brown flecks (like coffee grounds) in your vomit.  You have black or bloody stools.  You have a severe headache or stiff neck.  You are confused.  You have severe abdominal pain.  You have chest pain or trouble breathing.  You do not urinate at least once every 8 hours.  You develop cold or clammy skin.  You continue to vomit for longer than 24 to 48 hours.  You have a fever. MAKE SURE YOU:   Understand these instructions.  Will watch your condition.  Will get help right away if you are not doing well or get worse.   This information is not intended to replace advice given to you by your health care provider. Make sure you discuss any questions you have with your health care provider.   Document Released: 06/22/2005 Document Revised: 09/14/2011 Document Reviewed: 11/19/2010 Elsevier Interactive Patient Education [employer]

## 2015-10-24 NOTE — ED Provider Notes (Signed)
CSN: [id number]     Arrival date & time 10/24/15  1321 History   First MD Initiated Contact with Patient 10/24/15 1519     Chief Complaint  Patient presents with  . Emesis  . Diarrhea  . Fever  . Cough     (Consider location/radiation/quality/duration/timing/severity/associated sxs/prior Treatment) HPI Comments: Patient is a 55yo male who presents with cough, sore throat, nausea, vomiting, and diarrhea. The patient had a tooth extraction 1 week ago. The patient was given amoxicillin. It is unclear to the patient how he was supposed to take it. He states he was told to take it if he began having mouth pain. The patient has been healing well with no mouth pain. The developed a cough, nasal congestion, and sore throat 3 days ago. The cough has been productive with white sputum, per patient. The patient began taking amoxicillin every 3 hours for these symptoms. The patient then began experiencing nausea, vomiting, and diarrhea 2 days ago. The patient has been vomiting most every time he tries to eat a meal. He has been able to keep down fluids. Patient denies seeing blood in his stool or vomit. The patient has also been taking OTC sinus pills, most likely Zyrtec, without relief. The patient also endorses a L temporal headache that he describes as "pounding" that began today. Patient denies chest pain, shortness of breath, abdominal pain, or dysuria. The patient states he has had fever and chills for the past 2 days, with temperatures around 100.  Patient is a 55 y.o. male presenting with vomiting, diarrhea, fever, and cough. The history is provided by the patient.  Emesis Associated symptoms: chills, diarrhea, headaches and sore throat   Associated symptoms: no abdominal pain   Diarrhea Associated symptoms: chills, fever, headaches and vomiting   Associated symptoms: no abdominal pain   Fever Associated symptoms: chills, congestion, cough, diarrhea, headaches, nausea, sore throat and vomiting     Associated symptoms: no chest pain, no dysuria, no ear pain and no rash   Cough Associated symptoms: chills, fever, headaches and sore throat   Associated symptoms: no chest pain, no ear pain, no rash and no shortness of breath     Past Medical History  Diagnosis Date  . Medical history non-contributory    Past Surgical History  Procedure Laterality Date  . Left leg below knee amputation   2003    in accident at work   . Colonoscopy with propofol N/A 08/06/2014    Procedure: COLONOSCOPY WITH PROPOFOL;  Surgeon: Charolett Bumpers, MD;  Location: [facility];  Service: Endoscopy;  Laterality: N/A;  . Mouth surgery     Family History  Problem Relation Age of Onset  . Diabetes Mother   . Hypertension Mother   . Cancer Mother   . Stroke Mother   . Diabetes Father   . Hypertension Father   . Diabetes Daughter   . Hypertension Daughter   . Heart disease Daughter    Social History  Substance Use Topics  . Smoking status: Never Smoker   . Smokeless tobacco: Never Used  . Alcohol Use: Yes     Comment: socially     Review of Systems  Constitutional: Positive for fever, chills and appetite change.  HENT: Positive for congestion and sore throat. Negative for ear pain and facial swelling.   Respiratory: Positive for cough. Negative for shortness of breath.   Cardiovascular: Negative for chest pain.  Gastrointestinal: Positive for nausea, vomiting and diarrhea. Negative for abdominal pain  and blood in stool.  Genitourinary: Negative for dysuria.  Musculoskeletal: Negative for back pain.  Skin: Negative for rash and wound.  Neurological: Positive for headaches.  Psychiatric/Behavioral: The patient is not nervous/anxious.       Allergies  Review of patient's allergies indicates no known allergies.  Home Medications   Prior to Admission medications   Medication Sig Start Date End Date Taking? Authorizing Provider  amoxicillin (AMOXIL) 500 MG capsule Take 500 mg by mouth 3  (three) times daily. 10/21/15  Yes Historical Provider, MD  cetirizine (ZYRTEC) 10 MG tablet Take 10 mg by mouth daily.   Yes Historical Provider, MD  amoxicillin-clavulanate (AUGMENTIN) 875-125 MG per tablet Take 1 tablet by mouth 2 (two) times daily. Patient not taking: Reported on 08/02/2014 05/24/14   Doris Cheadleeepak Advani, MD  benzonatate (TESSALON) 100 MG capsule Take 1 capsule (100 mg total) by mouth every 8 (eight) hours. 10/24/15   Emi HolesAlexandra M Galilee Pierron, PA-C  ciprofloxacin (CIPRO) 500 MG tablet Take 1 tablet (500 mg total) by mouth 2 (two) times daily. Patient not taking: Reported on 08/02/2014 04/09/14   Doris Cheadleeepak Advani, MD  fluticasone (FLONASE) 50 MCG/ACT nasal spray Place 2 sprays into both nostrils daily. Patient not taking: Reported on 08/02/2014 05/24/14   Doris Cheadleeepak Advani, MD  guaiFENesin-dextromethorphan (ROBITUSSIN DM) 100-10 MG/5ML syrup Take 5 mLs by mouth every 4 (four) hours as needed for cough. Patient not taking: Reported on 08/02/2014 05/24/14   Doris Cheadleeepak Advani, MD  naproxen (NAPROSYN) 500 MG tablet Take 1 tablet (500 mg total) by mouth 2 (two) times daily with a meal. Patient not taking: Reported on 10/24/2015 12/26/14   Doris Cheadleeepak Advani, MD  ondansetron (ZOFRAN) 4 MG tablet Take 1 tablet (4 mg total) by mouth every 8 (eight) hours as needed for nausea or vomiting. 10/24/15   Emi HolesAlexandra M Joleene Burnham, PA-C  oxyCODONE-acetaminophen (PERCOCET/ROXICET) 5-325 MG tablet Take 1 tablet by mouth 2 (two) times daily as needed. for pain 04/11/15   Historical Provider, MD  traMADol (ULTRAM) 50 MG tablet Take 1 tablet (50 mg total) by mouth daily as needed for moderate pain. Patient not taking: Reported on 10/24/2015 01/14/15   Doris Cheadleeepak Advani, MD   BP 127/76 mmHg  Pulse 76  Temp(Src) 100 F (37.8 C) (Oral)  Resp 18  Ht 5\' 5"  (1.651 m)  Wt 77.111 kg  BMI 28.29 kg/m2  SpO2 97% Physical Exam  Constitutional: He appears well-developed and well-nourished. No distress.  HENT:  Head: Normocephalic and atraumatic.    Mouth/Throat: Oropharynx is clear and moist and mucous membranes are normal. Mucous membranes are not dry. No oropharyngeal exudate.    Eyes: Conjunctivae are normal. Pupils are equal, round, and reactive to light. Right eye exhibits no discharge. Left eye exhibits no discharge. No scleral icterus.  Neck: Normal range of motion. Neck supple. No thyromegaly present.  Cardiovascular: Normal rate, regular rhythm, normal heart sounds and intact distal pulses.  Exam reveals no gallop and no friction rub.   No murmur heard. Pulmonary/Chest: Effort normal and breath sounds normal. No stridor. No respiratory distress. He has no wheezes. He has no rhonchi. He has no rales.  Abdominal: Soft. Bowel sounds are normal. He exhibits no distension. There is no tenderness. There is no rebound and no guarding.  Musculoskeletal: He exhibits no edema.  Lymphadenopathy:    He has no cervical adenopathy.  Neurological: He is alert. Coordination normal.  Skin: Skin is warm and dry. No rash noted. He is not diaphoretic. No pallor.  Psychiatric:  He has a normal mood and affect.  Nursing note and vitals reviewed.   ED Course  Procedures (including critical care time) Labs Review Labs Reviewed  COMPREHENSIVE METABOLIC PANEL - Abnormal; Notable for the following:    Chloride 98 (*)    Glucose, Bld 105 (*)    All other components within normal limits  URINALYSIS, ROUTINE W REFLEX MICROSCOPIC (NOT AT [facility]) - Abnormal; Notable for the following:    Color, Urine AMBER (*)    All other components within normal limits  LIPASE, BLOOD  CBC    Imaging Review Dg Chest 2 View  10/24/2015  CLINICAL DATA:  Cough. EXAM: CHEST  2 VIEW COMPARISON:  None. FINDINGS: The heart size and mediastinal contours are within normal limits. Both lungs are clear. No pneumothorax or pleural effusion is noted. The visualized skeletal structures are unremarkable. IMPRESSION: No active cardiopulmonary disease. Electronically Signed   By:  Lupita Raider, M.D.   On: 10/24/2015 15:52   I have personally reviewed and evaluated these images and lab results as part of my medical decision-making.   EKG Interpretation None      MDM   Pt symptoms consistent with URI. CXR negative for acute infiltrate. Nausea, vomiting, diarrhea may be due to viral process or excessive amoxicillin intake. Patient tolerating greater than 6 ounces of fluids in ED. Nausea improved with Zofran in ED. Patient advised to discontinue amoxicillin. Pt will be discharged with symptomatic treatment, Zofran and Tessalon. Patient advised to continue taking Zyrtec at home.  Follow-up with PCP advised 2-3 if symptoms unresolved or worsening. Discussed strict return precautions. Pt is hemodynamically stable & in NAD prior to discharge. Patient improved and discharged in satisfactory condition.   Final diagnoses:  Cough  Sore throat  Congestion of nasal sinus  Non-intractable vomiting with nausea, vomiting of unspecified type  Diarrhea, unspecified type       Emi Holes, PA-C 10/24/15 1839  Benjiman Core, MD 10/26/15 [phone]

## 2015-10-24 NOTE — ED Notes (Addendum)
Per EMS- Patient c/o N/V/D x 4  days. Patient has been taking amoxicillin s/p mouth surgery.

## 2015-12-24 ENCOUNTER — Ambulatory Visit: Payer: Medicare Other | Admitting: Family Medicine

## 2016-01-02 ENCOUNTER — Other Ambulatory Visit: Payer: Self-pay | Admitting: *Deleted

## 2016-01-02 DIAGNOSIS — M25561 Pain in right knee: Secondary | ICD-10-CM

## 2016-01-02 MED ORDER — TRAMADOL HCL 50 MG PO TABS: 50.0000 mg | ORAL_TABLET | Freq: Two times a day (BID) | ORAL | Status: DC | PRN

## 2016-02-03 ENCOUNTER — Other Ambulatory Visit: Payer: Self-pay | Admitting: *Deleted

## 2016-02-03 DIAGNOSIS — M25561 Pain in right knee: Secondary | ICD-10-CM

## 2016-02-03 MED ORDER — TRAMADOL HCL 50 MG PO TABS: 50.0000 mg | ORAL_TABLET | Freq: Two times a day (BID) | ORAL | 0 refills | Status: DC | PRN

## 2016-02-04 DIAGNOSIS — M25561 Pain in right knee: Secondary | ICD-10-CM | POA: Diagnosis not present

## 2016-02-04 DIAGNOSIS — R0789 Other chest pain: Secondary | ICD-10-CM | POA: Diagnosis not present

## 2016-02-04 DIAGNOSIS — R531 Weakness: Secondary | ICD-10-CM | POA: Diagnosis not present

## 2016-02-04 DIAGNOSIS — Z96652 Presence of left artificial knee joint: Secondary | ICD-10-CM | POA: Diagnosis not present

## 2016-02-04 DIAGNOSIS — Z79891 Long term (current) use of opiate analgesic: Secondary | ICD-10-CM | POA: Diagnosis not present

## 2016-02-19 ENCOUNTER — Other Ambulatory Visit: Payer: Self-pay | Admitting: Sports Medicine

## 2016-02-19 ENCOUNTER — Other Ambulatory Visit: Payer: Self-pay | Admitting: *Deleted

## 2016-02-19 DIAGNOSIS — M25561 Pain in right knee: Secondary | ICD-10-CM

## 2016-02-19 MED ORDER — TRAMADOL HCL 50 MG PO TABS: 50.0000 mg | ORAL_TABLET | Freq: Two times a day (BID) | ORAL | 0 refills | Status: DC | PRN

## 2016-03-10 ENCOUNTER — Encounter: Payer: Self-pay | Admitting: Podiatry

## 2016-03-10 ENCOUNTER — Ambulatory Visit (INDEPENDENT_AMBULATORY_CARE_PROVIDER_SITE_OTHER): Payer: Medicare Other | Admitting: Podiatry

## 2016-03-10 VITALS — BP 197/80 | HR 69 | Temp 97.7°F | Resp 14

## 2016-03-10 DIAGNOSIS — B351 Tinea unguium: Secondary | ICD-10-CM | POA: Diagnosis not present

## 2016-03-10 DIAGNOSIS — M79674 Pain in right toe(s): Secondary | ICD-10-CM

## 2016-03-10 NOTE — Progress Notes (Signed)
   Subjective:    Patient ID: Carl PresserEric W Edrington, male    DOB: 04/17/1961, 55 y.o.   MRN: [id number]  HPI   This patient presents today concerned about a right hallux toenail that has gradually become thick, discolored and deformed and beginning to partially detached from the underlying nailbed. As result of loosening of this nail he finds it irritating when he wears a shoe. Patient has apply peroxide without any change in the right hallux nail  Patient has a history of traumatic left lower leg amputation occurring 2003 from a work accident He states that he is a nonsmoker  Review of Systems  All other systems reviewed and are negative.      Objective:   Physical Exam  Orientated 3  Vascular: DP and PT pulses 2/4 right  Capillary refill is immediate right  Neurologic: Sensation to 10 g monofilament wire intact 5/5 right Vibratory sensation reactive right Ankle reflex reactive right  Dermatological: No open skin lesions right The right hallux nail is discolored, brittle, hypertrophic, and lifting off the nailbed and tender to direct palpation There is no surrounding erythema, edema, active drainage from the right hallux nail Dystrophic changes noted in the fifth right toenail  Musculoskeletal: No deformities noted Manual motor testing: Dorsi flexion, plantar flexion, inversion, eversion 5/5 right BK amputation left     Assessment & Plan:   Assessment: Satisfactory neurovascular status BK amputation left Mycotic right hallux toenail  Plan: I reviewed the results of the examination with patient today and discussed treatment options including repetitive debridement, permanent toenail removal, or oral medication. I recommended debridement at this time and patient verbally consents  The right hallux nail and fifth right toenail were debrided mechanically and wet without any bleeding  Reappoint at patient's request

## 2016-03-10 NOTE — Patient Instructions (Signed)
Today the right great toenail was lifting off the nailbed with deformity associated with fungal nail changes. The nail was trimmed back and ground down to reduce the irritation from the nail. Return as needed for trimming of the nail. Other options include permanent nail removal surgically or oral medication

## 2016-03-17 DIAGNOSIS — M25561 Pain in right knee: Secondary | ICD-10-CM | POA: Diagnosis not present

## 2016-03-17 DIAGNOSIS — R531 Weakness: Secondary | ICD-10-CM | POA: Diagnosis not present

## 2016-03-17 DIAGNOSIS — R0789 Other chest pain: Secondary | ICD-10-CM | POA: Diagnosis not present

## 2016-03-17 DIAGNOSIS — M2550 Pain in unspecified joint: Secondary | ICD-10-CM | POA: Diagnosis not present

## 2016-03-24 ENCOUNTER — Encounter: Payer: Self-pay | Admitting: Family Medicine

## 2016-03-24 ENCOUNTER — Ambulatory Visit (INDEPENDENT_AMBULATORY_CARE_PROVIDER_SITE_OTHER): Payer: Medicare Other | Admitting: Family Medicine

## 2016-03-24 DIAGNOSIS — M1711 Unilateral primary osteoarthritis, right knee: Secondary | ICD-10-CM

## 2016-03-24 MED ORDER — METHYLPREDNISOLONE ACETATE 40 MG/ML IJ SUSP
40.0000 mg | Freq: Once | INTRAMUSCULAR | Status: AC
  Administered 2016-03-24: 40 mg via INTRA_ARTICULAR

## 2016-03-24 MED ORDER — TRAMADOL HCL 50 MG PO TABS: 50.0000 mg | ORAL_TABLET | Freq: Two times a day (BID) | ORAL | 1 refills | Status: DC | PRN

## 2016-03-24 NOTE — Patient Instructions (Signed)
Go to the [facility] and have them make your appt to see [facility] to have the shots in your knee due to your insurance coverage

## 2016-03-24 NOTE — Progress Notes (Addendum)
  Carl Vang - 55 y.o. male MRN [id number]  Date of birth: 12/02/1960  SUBJECTIVE:  Including CC & ROS.  Chief Complaint  Patient presents with  . Knee Pain   [name] is a 55 year old male that is presenting with right knee pain. He has a history of BKA on the left. He also has a history of tricompartmental primary osteoarthritis of the night knee. He's been taking tramadol to help with the pain. He receives steroid injections every 3-4 months this seems to help with the pain. He denies ever receiving viscous supplementation. He wears a brace on a regular occasion. The pain is always worse at night. It has been roughly 6 months since his last injection.  ROS: No unexpected weight loss, fever, chills, swelling, instability, numbness/tingling, redness, otherwise see HPI    HISTORY: Past Medical, Surgical, Social, and Family History Reviewed & Updated per EMR.   Pertinent Historical Findings include: PMSHx -  BKA 2003 Medications - tramadol   DATA REVIEWED: 12/27/14: right knee x-rayThere is narrowing of medial joint compartment. There is about 6.6 mm medial subluxation distal femur on tibial plateau. Narrowing of patellofemoral joint space. Spurring of patella. Small to moderate joint effusion.  PHYSICAL EXAM:  VS: BP:(!) 146/80  HR:86bpm  TEMP: ( )  RESP:   HT:5\' 5"  (165.1 cm)   WT:165 lb (74.8 kg)  BMI:27.5 PHYSICAL EXAM: Gen: NAD, alert, cooperative with exam,  HEENT: clear conjunctiva, EOMI CV:  no edema, capillary refill brisk,  Resp: non-labored, normal speech Skin: no rashes, normal turgor  Neuro: no gross deficits.  Psych:  alert and oriented Right Knee: Normal to inspection with no erythema or effusion or obvious bony abnormalities. Palpation normal with no warmth, joint line tenderness, patellar tenderness, or condyle tenderness. Able to achieve 110 of flexion. 5 of extension. Ligaments with solid consistent endpoints including ACL, LCL, MCL. Negative  Mcmurray's,  Non painful patellar compression. Patellar and quadriceps tendons unremarkable. Hamstring and quadriceps strength is normal.    Aspiration/Injection Procedure Note Carl Vang Carl Vang  Procedure: Injection Indications: Right knee primary osteoarthritis  Procedure Details Consent: Risks of procedure as well as the alternatives and risks of each were explained to the (patient/caregiver).  Consent for procedure obtained. Time Out: Verified patient identification, verified procedure, site/side was marked, verified correct patient position, special equipment/implants available, medications/allergies/relevent history reviewed, required imaging and test results available.  Performed.  The area was cleaned with iodine and alcohol swabs.    The right knee joint was injected using 1 cc's of 40 mg Depomedrol and 4 cc's of 1% lidocaine with a 21 1 1/2" needle.  Ultrasound wasn't used.  A sterile dressing was applied.  Patient did tolerate procedure well.   ASSESSMENT & PLAN:   Primary osteoarthritis of right knee Receives steroid injections on a regular occurrence but hasn't tried Visco supplementation. - Steroid injection today  - Refilled his tramadol  - Referral to orthopedics for Visco supplementation.

## 2016-03-25 NOTE — Assessment & Plan Note (Signed)
Receives steroid injections on a regular occurrence but hasn't tried Visco supplementation. - Steroid injection today  - Refilled his tramadol  - Referral to orthopedics for Visco supplementation.

## 2016-04-07 ENCOUNTER — Encounter: Payer: Self-pay | Admitting: Family Medicine

## 2016-04-07 ENCOUNTER — Ambulatory Visit: Payer: Medicare Other | Attending: Family Medicine | Admitting: Family Medicine

## 2016-04-07 VITALS — BP 140/80 | HR 76 | Temp 98.6°F | Resp 20 | Ht 65.0 in | Wt 158.0 lb

## 2016-04-07 DIAGNOSIS — I10 Essential (primary) hypertension: Secondary | ICD-10-CM

## 2016-04-07 DIAGNOSIS — Z23 Encounter for immunization: Secondary | ICD-10-CM

## 2016-04-07 DIAGNOSIS — Z114 Encounter for screening for human immunodeficiency virus [HIV]: Secondary | ICD-10-CM | POA: Diagnosis not present

## 2016-04-07 DIAGNOSIS — Z79899 Other long term (current) drug therapy: Secondary | ICD-10-CM | POA: Insufficient documentation

## 2016-04-07 DIAGNOSIS — R350 Frequency of micturition: Secondary | ICD-10-CM | POA: Insufficient documentation

## 2016-04-07 DIAGNOSIS — Z89512 Acquired absence of left leg below knee: Secondary | ICD-10-CM | POA: Insufficient documentation

## 2016-04-07 DIAGNOSIS — M1711 Unilateral primary osteoarthritis, right knee: Secondary | ICD-10-CM

## 2016-04-07 DIAGNOSIS — R7301 Impaired fasting glucose: Secondary | ICD-10-CM | POA: Diagnosis not present

## 2016-04-07 DIAGNOSIS — Z1159 Encounter for screening for other viral diseases: Secondary | ICD-10-CM

## 2016-04-07 DIAGNOSIS — R42 Dizziness and giddiness: Secondary | ICD-10-CM | POA: Insufficient documentation

## 2016-04-07 LAB — COMPLETE METABOLIC PANEL WITH GFR
ALT: 12 U/L (ref 9–46)
AST: 17 U/L (ref 10–35)
Albumin: 4.2 g/dL (ref 3.6–5.1)
Alkaline Phosphatase: 42 U/L (ref 40–115)
BUN: 10 mg/dL (ref 7–25)
CO2: 26 mmol/L (ref 20–31)
Calcium: 9.4 mg/dL (ref 8.6–10.3)
Chloride: 104 mmol/L (ref 98–110)
Creat: 0.74 mg/dL (ref 0.70–1.33)
GFR, Est African American: >89 mL/min (ref >=60)
GFR, Est Non African American: >89 mL/min (ref >=60)
Glucose, Bld: 94 mg/dL (ref 65–99)
Potassium: 4 mmol/L (ref 3.5–5.3)
Sodium: 140 mmol/L (ref 135–146)
Total Bilirubin: 0.5 mg/dL (ref 0.2–1.2)
Total Protein: 6.5 g/dL (ref 6.1–8.1)

## 2016-04-07 LAB — LIPID PANEL
Cholesterol: 182 mg/dL (ref 125–200)
HDL: 49 mg/dL (ref >=40)
LDL Cholesterol: 104 mg/dL (ref <130)
Total CHOL/HDL Ratio: 3.7 Ratio (ref <=5.0)
Triglycerides: 146 mg/dL (ref <150)
VLDL: 29 mg/dL (ref <30)

## 2016-04-07 MED ORDER — ACETAMINOPHEN-CODEINE #3 300-30 MG PO TABS: 1.0000 | ORAL_TABLET | Freq: Two times a day (BID) | ORAL | 0 refills | Status: DC | PRN

## 2016-04-07 MED ORDER — AMLODIPINE BESYLATE 5 MG PO TABS
5.0000 mg | ORAL_TABLET | Freq: Every day | ORAL | 3 refills | Status: DC
Start: 2016-04-07 — End: 2016-06-04

## 2016-04-07 NOTE — Progress Notes (Signed)
Subjective:  Patient ID: Carl Vang, male    DOB: 03/28/1961  Age: 55 y.o. MRN: [id number]  CC: Hypertension and Knee Pain   HPI Carl Vang has HTN, primary OA of R knee, s/p traumatic L BKA in work related injury he presents for   1. R knee pain due to OA: being treated by sports medicine. He comes today requesting a referral to orthopaedic surgery for visco supplementation. He has recently tried tramadol for pain control but reports it is not controlling his pain.   2. HTN: has intermittently elevated BP with dizziness. Has been dizzy now for the past 1.5 weeks off and on. No CP, HA, SOB. Trace swelling in legs. Has urinary frequency.   Social History  Substance Use Topics  . Smoking status: Never Smoker  . Smokeless tobacco: Never Used  . Alcohol use Yes     Comment: socially     Outpatient Medications Prior to Visit  Medication Sig Dispense Refill  . amoxicillin (AMOXIL) 500 MG capsule Take 500 mg by mouth 3 (three) times daily.  0  . naproxen (NAPROSYN) 500 MG tablet Take 1 tablet (500 mg total) by mouth 2 (two) times daily with a meal. (Patient not taking: Reported on 04/07/2016) 60 tablet 1  . traMADol (ULTRAM) 50 MG tablet Take 1 tablet (50 mg total) by mouth every 12 (twelve) hours as needed for moderate pain. (Patient not taking: Reported on 04/07/2016) 30 tablet 1   No facility-administered medications prior to visit.     ROS Review of Systems  Constitutional: Negative for chills, fatigue, fever and unexpected weight change.  Eyes: Negative for visual disturbance.  Respiratory: Negative for cough and shortness of breath.   Cardiovascular: Negative for chest pain, palpitations and leg swelling.  Gastrointestinal: Negative for abdominal pain, blood in stool, constipation, diarrhea, nausea and vomiting.  Endocrine: Negative for polydipsia, polyphagia and polyuria.  Musculoskeletal: Positive for arthralgias. Negative for back pain, gait problem, myalgias and neck  pain.  Skin: Negative for rash.  Allergic/Immunologic: Negative for immunocompromised state.  Neurological: Positive for dizziness.  Hematological: Negative for adenopathy. Does not bruise/bleed easily.  Psychiatric/Behavioral: Negative for dysphoric mood, sleep disturbance and suicidal ideas. The patient is not nervous/anxious.     Objective:  BP 140/80 (BP Location: Right Arm, Patient Position: Sitting, Cuff Size: Normal)   Pulse 76   Temp 98.6 F (37 C) (Oral)   Resp 20   Ht 5\' 5"  (1.651 m)   Wt 158 lb (71.7 kg)   SpO2 98%   BMI 26.29 kg/m   BP/Weight 04/07/2016 03/24/2016 03/10/2016  Systolic BP 140 146 197  Diastolic BP 80 80 80  Wt. (Lbs) 158 165 -  BMI 26.29 27.46 -    Physical Exam  Constitutional: He appears well-developed and well-nourished. No distress.  HENT:  Head: Normocephalic and atraumatic.  Neck: Normal range of motion. Neck supple.  Cardiovascular: Normal rate, regular rhythm, normal heart sounds and intact distal pulses.   Pulmonary/Chest: Effort normal and breath sounds normal.  Musculoskeletal: He exhibits no edema.       Legs: Neurological: He is alert.  Skin: Skin is warm and dry. No rash noted. No erythema.  Psychiatric: He has a normal mood and affect.   Lab Results  Component Value Date   HGBA1C 6.1 (H) 04/09/2014   Lab Results  Component Value Date   HGBA1C 5.7 (H) 04/07/2016    Assessment & Plan:  Carl Vang was seen today for hypertension  and knee pain.  Diagnoses and all orders for this visit:  Hypertension, unspecified type -     amLODipine (NORVASC) 5 MG tablet; Take 1 tablet (5 mg total) by mouth daily. -     COMPLETE METABOLIC PANEL WITH GFR -     Lipid Panel  Screening for HIV (human immunodeficiency virus) -     HIV antibody (with reflex)  Need for hepatitis C screening test -     Hepatitis C antibody, reflex  Primary osteoarthritis of right knee -     Cancel: Ambulatory referral to Orthopedic Surgery -      acetaminophen-codeine (TYLENOL #3) 300-30 MG tablet; Take 1 tablet by mouth every 12 (twelve) hours as needed for moderate pain or severe pain. -     Ambulatory referral to Orthopedic Surgery  Encounter for immunization -     Flu Vaccine QUAD 36+ mos IM  IFG (impaired fasting glucose) -     Hemoglobin A1c  Flu vaccine need -     Flu Vaccine QUAD 36+ mos IM  Other orders -     Hepatitis C antibody   There are no diagnoses linked to this encounter.  No orders of the defined types were placed in this encounter.   Follow-up: Return in about 4 weeks (around 05/05/2016) for HTN .   Dessa Phi MD

## 2016-04-07 NOTE — Patient Instructions (Addendum)
Carl Vang was seen today for hypertension.  Diagnoses and all orders for this visit:  Hypertension, unspecified type -     amLODipine (NORVASC) 5 MG tablet; Take 1 tablet (5 mg total) by mouth daily. -     COMPLETE METABOLIC PANEL WITH GFR -     Lipid Panel  Screening for HIV (human immunodeficiency virus) -     HIV antibody (with reflex)  Need for hepatitis C screening test -     Hepatitis C antibody, reflex  Primary osteoarthritis of right knee -     Ambulatory referral to Orthopedic Surgery    F/u in 4 weeks for HTN   Dr. Armen PickupFunches

## 2016-04-07 NOTE — Progress Notes (Signed)
Patient needs referral to orthopedic for viscosupplementation injection (Rt Knee). Tramadol and tylenol not helping knee pain. Out of tramadol since last week. Concerned about BP; been having intermitt dizziness for 1 1/2 weeks

## 2016-04-08 DIAGNOSIS — I10 Essential (primary) hypertension: Secondary | ICD-10-CM | POA: Insufficient documentation

## 2016-04-08 LAB — HIV ANTIBODY (ROUTINE TESTING W REFLEX): HIV 1&2 Ab, 4th Generation: NONREACTIVE

## 2016-04-08 LAB — HEMOGLOBIN A1C
Hgb A1c MFr Bld: 5.7 % — ABNORMAL HIGH (ref <5.7)
Mean Plasma Glucose: 117 mg/dL

## 2016-04-08 LAB — HEPATITIS C ANTIBODY: HCV Ab: NEGATIVE

## 2016-04-08 NOTE — Assessment & Plan Note (Signed)
A: HTN untreated P: Start norvasc 5 mg daily

## 2016-04-08 NOTE — Assessment & Plan Note (Signed)
A: primary OA R knee P:  Tylenol #3 for pain control Ortho referral placed

## 2016-05-05 ENCOUNTER — Ambulatory Visit: Payer: Medicare Other | Admitting: Family Medicine

## 2016-05-26 ENCOUNTER — Ambulatory Visit: Payer: Medicare Other | Admitting: Family Medicine

## 2016-06-01 DIAGNOSIS — M25461 Effusion, right knee: Secondary | ICD-10-CM | POA: Diagnosis not present

## 2016-06-01 DIAGNOSIS — M1711 Unilateral primary osteoarthritis, right knee: Secondary | ICD-10-CM | POA: Diagnosis not present

## 2016-06-01 DIAGNOSIS — M25561 Pain in right knee: Secondary | ICD-10-CM | POA: Diagnosis not present

## 2016-06-04 ENCOUNTER — Encounter: Payer: Self-pay | Admitting: Family Medicine

## 2016-06-04 ENCOUNTER — Ambulatory Visit: Payer: Medicare Other | Attending: Family Medicine | Admitting: Family Medicine

## 2016-06-04 ENCOUNTER — Telehealth: Payer: Self-pay | Admitting: Family Medicine

## 2016-06-04 DIAGNOSIS — M1711 Unilateral primary osteoarthritis, right knee: Secondary | ICD-10-CM | POA: Diagnosis not present

## 2016-06-04 DIAGNOSIS — Z89512 Acquired absence of left leg below knee: Secondary | ICD-10-CM | POA: Diagnosis not present

## 2016-06-04 DIAGNOSIS — R42 Dizziness and giddiness: Secondary | ICD-10-CM | POA: Insufficient documentation

## 2016-06-04 DIAGNOSIS — I10 Essential (primary) hypertension: Secondary | ICD-10-CM | POA: Insufficient documentation

## 2016-06-04 DIAGNOSIS — Z23 Encounter for immunization: Secondary | ICD-10-CM

## 2016-06-04 MED ORDER — ACETAMINOPHEN-CODEINE #3 300-30 MG PO TABS: 1.0000 | ORAL_TABLET | Freq: Two times a day (BID) | ORAL | 2 refills | Status: DC | PRN

## 2016-06-04 MED ORDER — AMLODIPINE BESYLATE 5 MG PO TABS: 5.0000 mg | ORAL_TABLET | Freq: Every day | ORAL | 11 refills | Status: DC

## 2016-06-04 NOTE — Patient Instructions (Addendum)
Carl Vang was seen today for hypertension.  Diagnoses and all orders for this visit:  Hypertension, unspecified type -     amLODipine (NORVASC) 5 MG tablet; Take 1 tablet (5 mg total) by mouth daily.  Primary osteoarthritis of right knee -     acetaminophen-codeine (TYLENOL #3) 300-30 MG tablet; Take 1 tablet by mouth every 12 (twelve) hours as needed for moderate pain or severe pain.   F/u in 3 months for HTN and R knee pain  Dr. Armen PickupFunches

## 2016-06-04 NOTE — Telephone Encounter (Signed)
Called [facility] to cancel tylenol #3 Rx since he takes percocet  Called patient left VM Advised him not to fill the pain medicine I gave and to just take the medication that Dr. Ronne BinningMcKenzie gave. He is advised to not take 2 pain medications from different providers.  He is asked to call with questions.

## 2016-06-04 NOTE — Progress Notes (Signed)
Subjective:  Patient ID: Carl Vang, male    DOB: 08/09/1960  Age: 55 y.o. MRN: [id number]  CC: Hypertension   HPI Carl Vang has HTN, primary OA of R knee, s/p traumatic L BKA in work related injury he presents for   1. R knee pain due to OA: being treated by sports medicine. He comes today requesting a referral to orthopaedic surgery for visco supplementation. He has recently tried tramadol for pain control but reports it is not controlling his pain. He is currently taking percocet prescribed by Dr. Ronne BinningMcKenzie and tylenol for pain.   2. HTN: has intermittently elevated BP with dizziness. Has been dizzy now for the past 1.5 weeks off and on. No CP, HA, SOB. Trace swelling in legs. Has urinary frequency.   Social History  Substance Use Topics  . Smoking status: Never Smoker  . Smokeless tobacco: Never Used  . Alcohol use Yes     Comment: socially     Outpatient Medications Prior to Visit  Medication Sig Dispense Refill  . acetaminophen-codeine (TYLENOL #3) 300-30 MG tablet Take 1 tablet by mouth every 12 (twelve) hours as needed for moderate pain or severe pain. 60 tablet 0  . amLODipine (NORVASC) 5 MG tablet Take 1 tablet (5 mg total) by mouth daily. 30 tablet 3   No facility-administered medications prior to visit.     ROS Review of Systems  Constitutional: Negative for chills, fatigue, fever and unexpected weight change.  Eyes: Negative for visual disturbance.  Respiratory: Negative for cough and shortness of breath.   Cardiovascular: Negative for chest pain, palpitations and leg swelling.  Gastrointestinal: Negative for abdominal pain, blood in stool, constipation, diarrhea, nausea and vomiting.  Endocrine: Negative for polydipsia, polyphagia and polyuria.  Musculoskeletal: Positive for arthralgias. Negative for back pain, gait problem, myalgias and neck pain.  Skin: Negative for rash.  Allergic/Immunologic: Negative for immunocompromised state.  Neurological:  Positive for dizziness.  Hematological: Negative for adenopathy. Does not bruise/bleed easily.  Psychiatric/Behavioral: Negative for dysphoric mood, sleep disturbance and suicidal ideas. The patient is not nervous/anxious.     Objective:  BP 130/74 (BP Location: Right Arm, Patient Position: Sitting, Cuff Size: Small)   Pulse 72   Temp 97.8 F (36.6 C) (Oral)   Ht 5\' 5"  (1.651 m)   Wt 164 lb 6.4 oz (74.6 kg)   SpO2 97%   BMI 27.36 kg/m   BP/Weight 06/04/2016 04/07/2016 03/24/2016  Systolic BP 130 140 146  Diastolic BP 74 80 80  Wt. (Lbs) 164.4 158 165  BMI 27.36 26.29 27.46    Physical Exam  Constitutional: He appears well-developed and well-nourished. No distress.  HENT:  Head: Normocephalic and atraumatic.  Neck: Normal range of motion. Neck supple.  Cardiovascular: Normal rate, regular rhythm, normal heart sounds and intact distal pulses.   Pulmonary/Chest: Effort normal and breath sounds normal.  Musculoskeletal: He exhibits no edema.       Legs: Neurological: He is alert.  Skin: Skin is warm and dry. No rash noted. No erythema.  Psychiatric: He has a normal mood and affect.   Lab Results  Component Value Date   HGBA1C 5.7 (H) 04/07/2016     Assessment & Plan:  Carl Vang was seen today for hypertension.  Diagnoses and all orders for this visit:  Hypertension, unspecified type -     amLODipine (NORVASC) 5 MG tablet; Take 1 tablet (5 mg total) by mouth daily.  Primary osteoarthritis of right knee -  Discontinue: acetaminophen-codeine (TYLENOL #3) 300-30 MG tablet; Take 1 tablet by mouth every 12 (twelve) hours as needed for moderate pain or severe pain.  Other orders -     Tdap vaccine greater than or equal to 7yo IM   There are no diagnoses linked to this encounter.  No orders of the defined types were placed in this encounter.   Follow-up: Return in about 3 months (around 09/02/2016) for HTN and R knee pain .   Dessa PhiJosalyn Jayla Mackie MD

## 2016-06-08 ENCOUNTER — Other Ambulatory Visit: Payer: Self-pay | Admitting: *Deleted

## 2016-06-08 ENCOUNTER — Telehealth: Payer: Self-pay | Admitting: Family Medicine

## 2016-06-08 NOTE — Telephone Encounter (Signed)
Patient has questions in regards to his tylenol 3 and percocet  Please follow up.

## 2016-06-08 NOTE — Assessment & Plan Note (Signed)
Continue with plan for ortho Continue with percocet Will no longer prescribe tylenol #3 as patient cannot have two providers prescribing chronic narcotics

## 2016-06-08 NOTE — Telephone Encounter (Signed)
Called back to patient Verified name and DOB He reports that Dr. Ronne BinningMcKenzie prescribes percocet for him. He takes it at night for pain. He would like for me to continue to prescribed tylenol #3 for daytime pain.  I informed him that I can prescribed tylenol #3 Per our controlled substance policy he cannot get controlled substances from 2 providers I advised that he speak with Dr. Ronne BinningMcKenzie regarding tylenol #3  He agrees with plan and voices understanding

## 2016-06-09 ENCOUNTER — Telehealth: Payer: Self-pay | Admitting: *Deleted

## 2016-06-09 NOTE — Telephone Encounter (Signed)
-----   Message from Lizbeth BarkMelanie L Ceresi sent at 06/08/2016 11:52 AM EST ----- Regarding: refill request Pt is requesting a refill on [facility]ad[facility] [address]

## 2016-06-09 NOTE — Telephone Encounter (Signed)
After reviewing the notes about his other prescribed medications, we are not going to refill his tramadol. This will need to come from his PCP.

## 2016-06-11 ENCOUNTER — Other Ambulatory Visit: Payer: Self-pay | Admitting: Family Medicine

## 2016-06-18 DIAGNOSIS — M25561 Pain in right knee: Secondary | ICD-10-CM | POA: Diagnosis not present

## 2016-07-14 DIAGNOSIS — S83206A Unspecified tear of unspecified meniscus, current injury, right knee, initial encounter: Secondary | ICD-10-CM | POA: Diagnosis not present

## 2016-07-14 DIAGNOSIS — Z89512 Acquired absence of left leg below knee: Secondary | ICD-10-CM | POA: Diagnosis not present

## 2016-07-14 DIAGNOSIS — M1711 Unilateral primary osteoarthritis, right knee: Secondary | ICD-10-CM | POA: Diagnosis not present

## 2016-07-29 DIAGNOSIS — M94261 Chondromalacia, right knee: Secondary | ICD-10-CM | POA: Diagnosis not present

## 2016-07-29 DIAGNOSIS — M23251 Derangement of posterior horn of lateral meniscus due to old tear or injury, right knee: Secondary | ICD-10-CM | POA: Diagnosis not present

## 2016-07-29 DIAGNOSIS — G8918 Other acute postprocedural pain: Secondary | ICD-10-CM | POA: Diagnosis not present

## 2016-07-29 DIAGNOSIS — M23221 Derangement of posterior horn of medial meniscus due to old tear or injury, right knee: Secondary | ICD-10-CM | POA: Diagnosis not present

## 2016-07-29 DIAGNOSIS — M6751 Plica syndrome, right knee: Secondary | ICD-10-CM | POA: Diagnosis not present

## 2016-07-29 DIAGNOSIS — S83242A Other tear of medial meniscus, current injury, left knee, initial encounter: Secondary | ICD-10-CM | POA: Diagnosis not present

## 2016-08-04 DIAGNOSIS — M25461 Effusion, right knee: Secondary | ICD-10-CM | POA: Diagnosis not present

## 2016-08-05 ENCOUNTER — Encounter: Payer: Self-pay | Admitting: Podiatry

## 2016-08-05 ENCOUNTER — Ambulatory Visit (INDEPENDENT_AMBULATORY_CARE_PROVIDER_SITE_OTHER): Payer: Medicare Other | Admitting: Podiatry

## 2016-08-05 VITALS — BP 140/77 | HR 78 | Resp 18

## 2016-08-05 DIAGNOSIS — B351 Tinea unguium: Secondary | ICD-10-CM | POA: Diagnosis not present

## 2016-08-05 DIAGNOSIS — M79674 Pain in right toe(s): Secondary | ICD-10-CM

## 2016-08-05 NOTE — Progress Notes (Signed)
   Subjective:    Patient ID: Carl Vang, male    DOB: 12/12/1960, [age] y.o.   MRN: [id number]  HPI    Review of Systems     Objective:   Physical Exam        Assessment & Plan:

## 2016-08-05 NOTE — Patient Instructions (Addendum)
Contact your treating doctor for knee surgery to evaluate the edema in her lower leg

## 2016-08-05 NOTE — Progress Notes (Signed)
   Subjective:    Patient ID: Carl Vang, male    DOB: 05/22/1961, 56 y.o.   MRN: [id number]  HPI   I need to get my toenails trimmed and I had knee surgery last Wednesday and my right foot has been swelling ever since    Review of Systems  All other systems reviewed and are negative.      Objective:   Physical Exam        Assessment & Plan:

## 2016-08-05 NOTE — Progress Notes (Signed)
Patient ID: Carl Vang, male   DOB: 06/21/1961, 56 y.o.   MRN: [id number]   Subjective: Patient presents for visit complaining of thickened and uncomfortable toenails in the right foot and request toenail debridement. Patient has had right knee surgery in the past week and has noticed swelling in the right lower extremity. Patient has a history of traumatic left lower leg amputation occurring from a 2003 work accident Patient denies smoking history  Objective:  Orientated 3  Vascular: Right lower leg edema, nonpitting without calf pain or calf tenderness DP and PT pulses 2/4 right  Capillary refill is immediate right  Neurologic: Sensation to 10 g monofilament wire intact 5/5 right Vibratory sensation reactive right Ankle reflex reactive right  Dermatological: Ace wrap around right lower leg postop surgery No open skin lesions right The right hallux nail is discolored, brittle, hypertrophic, and lifting off the nailbed and tender to direct palpation There is no surrounding erythema, edema, active drainage from the right hallux nail Toenails 2-5 are elongated, discolored, deformed  Musculoskeletal: No deformities noted Manual motor testing: Dorsi flexion, plantar flexion, inversion, eversion 5/5 right BK amputation left  Assessment: Postop edema right lower extremity No evidence of DVT right Mycotic toenails 1-5 right  Plan: Debridement toenails 1-5 mechanically electrically without any bleeding Patient instructed to contact his treating surgeon for knee surgery to evaluate edema and right lower extremity  Reappoint when necessary at patient's request

## 2016-08-11 ENCOUNTER — Ambulatory Visit: Payer: Medicare Other | Admitting: Family Medicine

## 2016-08-25 ENCOUNTER — Encounter: Payer: Self-pay | Admitting: Family Medicine

## 2016-08-25 ENCOUNTER — Ambulatory Visit: Payer: Medicare Other | Attending: Family Medicine | Admitting: Family Medicine

## 2016-08-25 VITALS — BP 138/72 | HR 76 | Temp 98.3°F | Ht 65.0 in | Wt 195.8 lb

## 2016-08-25 DIAGNOSIS — Z79899 Other long term (current) drug therapy: Secondary | ICD-10-CM | POA: Diagnosis not present

## 2016-08-25 DIAGNOSIS — M1711 Unilateral primary osteoarthritis, right knee: Secondary | ICD-10-CM

## 2016-08-25 DIAGNOSIS — I10 Essential (primary) hypertension: Secondary | ICD-10-CM | POA: Diagnosis not present

## 2016-08-25 MED ORDER — HYDROCHLOROTHIAZIDE 12.5 MG PO TABS: 12.5000 mg | ORAL_TABLET | Freq: Every day | ORAL | 2 refills | Status: DC

## 2016-08-25 NOTE — Progress Notes (Signed)
Subjective:  Patient ID: Carl Vang, male    DOB: [birth year]  Age: 56 y.o. MRN: [id number]  CC: Hypertension and Knee Pain   HPI Carl Vang has HTN, primary OA of R knee, s/p traumatic L BKA in work related injury he presents for   1. R knee pain due to OA: s/p R knee arthroscopy 3 weeks ago. Pain control per ortho with percocet. Pain has improve since pre-surgery.   2. HTN: compliant with Norvasc. Having some swelling in R leg. No CP, HA, SOB.   Social History  Substance Use Topics  . Smoking status: Never Smoker  . Smokeless tobacco: Never Used  . Alcohol use Yes     Comment: socially     Outpatient Medications Prior to Visit  Medication Sig Dispense Refill  . amLODipine (NORVASC) 5 MG tablet Take 1 tablet (5 mg total) by mouth daily. 30 tablet 11  . meloxicam (MOBIC) 15 MG tablet     . oxyCODONE-acetaminophen (PERCOCET) 5-325 MG tablet Take 1 tablet by mouth 2 (two) times daily as needed for severe pain. Per Dr. Billee Cashing    . acetaminophen-codeine (TYLENOL #3) 300-30 MG tablet TAKE 1 TABLET BY MOUTH EVERY 12 HOURS AS NEEDED FOR MODERATE TO SEVERE PAIN  0  . traMADol (ULTRAM) 50 MG tablet TAKE 1 TABLET EVERY 12 HOURS AS NEEDED MODERATE PAIN  1   No facility-administered medications prior to visit.     ROS Review of Systems  Constitutional: Negative for chills, fatigue, fever and unexpected weight change.  Eyes: Negative for visual disturbance.  Respiratory: Negative for cough and shortness of breath.   Cardiovascular: Positive for leg swelling. Negative for chest pain and palpitations.  Gastrointestinal: Negative for abdominal pain, blood in stool, constipation, diarrhea, nausea and vomiting.  Endocrine: Negative for polydipsia, polyphagia and polyuria.  Musculoskeletal: Positive for arthralgias. Negative for back pain, gait problem, myalgias and neck pain.  Skin: Negative for rash.  Allergic/Immunologic: Negative for immunocompromised state.    Neurological: Negative for dizziness.  Hematological: Negative for adenopathy. Does not bruise/bleed easily.  Psychiatric/Behavioral: Negative for dysphoric mood, sleep disturbance and suicidal ideas. The patient is not nervous/anxious.     Objective:  BP 138/72 (BP Location: Left Arm, Patient Position: Sitting, Cuff Size: Small)   Pulse 76   Temp 98.3 F (36.8 C) (Oral)   Ht 5\' 5"  (1.651 m)   Wt 195 lb 12.8 oz (88.8 kg)   SpO2 97%   BMI 32.58 kg/m   BP/Weight 08/25/2016 08/05/2016 06/04/2016  Systolic BP 138 140 130  Diastolic BP 72 77 74  Wt. (Lbs) 195.8 - 164.4  BMI 32.58 - 27.36   Wt Readings from Last 3 Encounters:  08/25/16 195 lb 12.8 oz (88.8 kg)  06/04/16 164 lb 6.4 oz (74.6 kg)  04/07/16 158 lb (71.7 kg)     Physical Exam  Constitutional: He appears well-developed and well-nourished. No distress.  HENT:  Head: Normocephalic and atraumatic.  Neck: Normal range of motion. Neck supple.  Cardiovascular: Normal rate, regular rhythm, normal heart sounds and intact distal pulses.   Pulmonary/Chest: Effort normal and breath sounds normal.  Musculoskeletal: He exhibits no edema.       Legs: Neurological: He is alert.  Skin: Skin is warm and dry. No rash noted. No erythema.  Psychiatric: He has a normal mood and affect.   Lab Results  Component Value Date   HGBA1C 5.7 (H) 04/07/2016     Assessment & Plan:  Carl Vang was seen today for hypertension and knee pain.  Diagnoses and all orders for this visit:  Hypertension, unspecified type -     hydrochlorothiazide (HYDRODIURIL) 12.5 MG tablet; Take 1 tablet (12.5 mg total) by mouth daily.   There are no diagnoses linked to this encounter.  No orders of the defined types were placed in this encounter.   Follow-up: Return in about 3 months (around 11/22/2016) for HTN .   Dessa PhiJosalyn Fareeha Evon MD

## 2016-08-25 NOTE — Assessment & Plan Note (Signed)
A: HTN with trace leg swelling  P: Add HCTZ 12.5 mg daily Continue norvasc 5 mg daily F/u in 3 months

## 2016-08-25 NOTE — Patient Instructions (Addendum)
Carl Vang was seen today for hypertension and knee pain.  Diagnoses and all orders for this visit:  Hypertension, unspecified type -     hydrochlorothiazide (HYDRODIURIL) 12.5 MG tablet; Take 1 tablet (12.5 mg total) by mouth daily.    F/u in 3 months for HTN  Dr. Armen PickupFunches

## 2016-08-25 NOTE — Assessment & Plan Note (Signed)
S/p arthroscopy Pain control per ortho

## 2016-09-01 DIAGNOSIS — M25461 Effusion, right knee: Secondary | ICD-10-CM | POA: Diagnosis not present

## 2016-09-09 ENCOUNTER — Telehealth: Payer: Self-pay | Admitting: Family Medicine

## 2016-09-09 NOTE — Telephone Encounter (Signed)
I don't have any paperwork dr Armen Pickupfunches must still have paperwork. Will follow up with PCP when she returns.

## 2016-09-09 NOTE — Telephone Encounter (Signed)
Paperwork was faxed on Monday regarding home care...please follow up

## 2016-09-10 NOTE — Telephone Encounter (Signed)
Christina from [facility] called the office to follow up on the status of the documents for patient. Please follow up, [phone].  Thank you

## 2016-09-11 NOTE — Telephone Encounter (Signed)
[facility] following up on papers faxed on Monday regarding home care Requesting to be followed up with at [phone]

## 2016-09-15 ENCOUNTER — Telehealth: Payer: Self-pay | Admitting: Family Medicine

## 2016-09-15 NOTE — Telephone Encounter (Signed)
Paperwork is filled out and will be faxed over today 09/15/16

## 2016-09-29 DIAGNOSIS — M25461 Effusion, right knee: Secondary | ICD-10-CM | POA: Diagnosis not present

## 2016-09-29 DIAGNOSIS — M1711 Unilateral primary osteoarthritis, right knee: Secondary | ICD-10-CM | POA: Diagnosis not present

## 2016-09-29 DIAGNOSIS — Z79891 Long term (current) use of opiate analgesic: Secondary | ICD-10-CM | POA: Diagnosis not present

## 2016-11-03 ENCOUNTER — Encounter (INDEPENDENT_AMBULATORY_CARE_PROVIDER_SITE_OTHER): Payer: Medicare Other | Admitting: Podiatry

## 2016-11-03 NOTE — Progress Notes (Signed)
This encounter was created in error - please disregard.

## 2016-11-18 ENCOUNTER — Encounter: Payer: Self-pay | Admitting: Family Medicine

## 2016-11-23 ENCOUNTER — Ambulatory Visit: Payer: Medicare Other | Admitting: Family Medicine

## 2016-12-01 DIAGNOSIS — Z79891 Long term (current) use of opiate analgesic: Secondary | ICD-10-CM | POA: Diagnosis not present

## 2016-12-14 ENCOUNTER — Other Ambulatory Visit: Payer: Self-pay | Admitting: Family Medicine

## 2016-12-14 DIAGNOSIS — I10 Essential (primary) hypertension: Secondary | ICD-10-CM

## 2016-12-18 ENCOUNTER — Other Ambulatory Visit: Payer: Self-pay | Admitting: Family Medicine

## 2016-12-18 DIAGNOSIS — I10 Essential (primary) hypertension: Secondary | ICD-10-CM

## 2016-12-22 ENCOUNTER — Ambulatory Visit: Payer: Medicare Other | Admitting: Family Medicine

## 2017-01-01 DIAGNOSIS — Z79891 Long term (current) use of opiate analgesic: Secondary | ICD-10-CM | POA: Diagnosis not present

## 2017-01-14 ENCOUNTER — Other Ambulatory Visit: Payer: Self-pay | Admitting: Family Medicine

## 2017-01-14 DIAGNOSIS — I10 Essential (primary) hypertension: Secondary | ICD-10-CM

## 2017-01-21 DIAGNOSIS — M25561 Pain in right knee: Secondary | ICD-10-CM | POA: Diagnosis not present

## 2017-01-28 ENCOUNTER — Encounter: Payer: Self-pay | Admitting: Family Medicine

## 2017-01-28 ENCOUNTER — Ambulatory Visit: Payer: Medicare Other | Attending: Family Medicine | Admitting: Family Medicine

## 2017-01-28 VITALS — BP 112/65 | HR 82 | Temp 97.7°F | Ht 65.0 in | Wt 167.4 lb

## 2017-01-28 DIAGNOSIS — Z79891 Long term (current) use of opiate analgesic: Secondary | ICD-10-CM | POA: Diagnosis not present

## 2017-01-28 DIAGNOSIS — Z79899 Other long term (current) drug therapy: Secondary | ICD-10-CM | POA: Diagnosis not present

## 2017-01-28 DIAGNOSIS — R7309 Other abnormal glucose: Secondary | ICD-10-CM | POA: Diagnosis not present

## 2017-01-28 DIAGNOSIS — M7989 Other specified soft tissue disorders: Secondary | ICD-10-CM | POA: Diagnosis not present

## 2017-01-28 DIAGNOSIS — M1711 Unilateral primary osteoarthritis, right knee: Secondary | ICD-10-CM | POA: Insufficient documentation

## 2017-01-28 DIAGNOSIS — I10 Essential (primary) hypertension: Secondary | ICD-10-CM

## 2017-01-28 LAB — POCT GLYCOSYLATED HEMOGLOBIN (HGB A1C): Hemoglobin A1C: 6

## 2017-01-28 MED ORDER — HYDROCHLOROTHIAZIDE 12.5 MG PO TABS: 12.5000 mg | ORAL_TABLET | Freq: Every day | ORAL | 3 refills | Status: DC

## 2017-01-28 MED ORDER — AMLODIPINE BESYLATE 5 MG PO TABS: 5.0000 mg | ORAL_TABLET | Freq: Every day | ORAL | 3 refills | Status: DC

## 2017-01-28 NOTE — Assessment & Plan Note (Addendum)
A: very well controlled HTN P: Continue amlodipine 5 mg daily Continue HCTZ 12.5 mg daily CMP, lipid   Normal lipids Calculated 10 year CVD risk 8.0 %, moderate intensity stating ordered, lipitor 20 mg daily

## 2017-01-28 NOTE — Patient Instructions (Addendum)
Carl Vang was seen today for hypertension.  Diagnoses and all orders for this visit:  Elevated hemoglobin A1c -     HgB A1c  Hypertension, unspecified type -     amLODipine (NORVASC) 5 MG tablet; Take 1 tablet (5 mg total) by mouth daily. -     hydrochlorothiazide (HYDRODIURIL) 12.5 MG tablet; Take 1 tablet (12.5 mg total) by mouth daily. -     CMP14+EGFR -     Lipid Panel   Return in 8 weeks for flu shot this can be done in flu shot clinic or during a nurse visit  F/u in 6 months for HTN  Dr. Adrian Blackwater

## 2017-01-28 NOTE — Progress Notes (Signed)
Subjective:  Patient ID: Carl Vang, male    DOB: 08/28/1960  Age: 57 y.o. MRN: [id number]  CC: Hypertension   HPI Carl Vang has HTN, primary OA of R knee, s/p traumatic L BKA in work related injury he presents for    1. HTN: compliant with Norvasc and HCTZ. Having some swelling in R leg. No CP, HA, SOB. He walks and rides his bike for exercise. Request blood work today.   Social History  Substance Use Topics  . Smoking status: Never Smoker  . Smokeless tobacco: Never Used  . Alcohol use Yes     Comment: socially     Outpatient Medications Prior to Visit  Medication Sig Dispense Refill  . amLODipine (NORVASC) 5 MG tablet Take 1 tablet (5 mg total) by mouth daily. 30 tablet 11  . hydrochlorothiazide (HYDRODIURIL) 12.5 MG tablet TAKE 1 TABLET BY MOUTH EVERY DAY 30 tablet 0  . meloxicam (MOBIC) 15 MG tablet     . oxyCODONE-acetaminophen (PERCOCET) 5-325 MG tablet Take 1 tablet by mouth 2 (two) times daily as needed for severe pain. Per Dr. Ricke Vang     No facility-administered medications prior to visit.     ROS Review of Systems  Constitutional: Negative for chills, fatigue, fever and unexpected weight change.  Eyes: Negative for visual disturbance.  Respiratory: Negative for cough and shortness of breath.   Cardiovascular: Positive for leg swelling. Negative for chest pain and palpitations.  Gastrointestinal: Negative for abdominal pain, blood in stool, constipation, diarrhea, nausea and vomiting.  Endocrine: Negative for polydipsia, polyphagia and polyuria.  Musculoskeletal: Positive for arthralgias. Negative for back pain, gait problem, myalgias and neck pain.  Skin: Negative for rash.  Allergic/Immunologic: Negative for immunocompromised state.  Neurological: Negative for dizziness.  Hematological: Negative for adenopathy. Does not bruise/bleed easily.  Psychiatric/Behavioral: Negative for dysphoric mood, sleep disturbance and suicidal ideas. The patient  is not nervous/anxious.     Objective:  BP 112/65   Pulse 82   Temp 97.7 F (36.5 C) (Oral)   Ht 5' 5"  (1.651 m)   Wt 167 lb 6.4 oz (75.9 kg)   SpO2 96%   BMI 27.86 kg/m   BP/Weight 01/28/2017 08/25/2016 07/06/7508  Systolic BP 258 527 782  Diastolic BP 65 72 77  Wt. (Lbs) 167.4 195.8 -  BMI 27.86 32.58 -   Wt Readings from Last 3 Encounters:  01/28/17 167 lb 6.4 oz (75.9 kg)  08/25/16 195 lb 12.8 oz (88.8 kg)  06/04/16 164 lb 6.4 oz (74.6 kg)     Physical Exam  Constitutional: He appears well-developed and well-nourished. No distress.  HENT:  Head: Normocephalic and atraumatic.  Neck: Normal range of motion. Neck supple. Carotid bruit is not present.  Cardiovascular: Normal rate, regular rhythm, normal heart sounds and intact distal pulses.   Pulmonary/Chest: Effort normal and breath sounds normal.  Musculoskeletal: He exhibits no edema.       Legs: Neurological: He is alert.  Skin: Skin is warm and dry. No rash noted. No erythema.  Psychiatric: He has a normal mood and affect.   Lab Results  Component Value Date   HGBA1C 5.7 (H) 04/07/2016   Lab Results  Component Value Date   HGBA1C 6.0 01/28/2017     Assessment & Plan:  Khyren was seen today for hypertension.  Diagnoses and all orders for this visit:  Elevated hemoglobin A1c -     HgB A1c  Hypertension, unspecified type -  amLODipine (NORVASC) 5 MG tablet; Take 1 tablet (5 mg total) by mouth daily. -     hydrochlorothiazide (HYDRODIURIL) 12.5 MG tablet; Take 1 tablet (12.5 mg total) by mouth daily. -     CMP14+EGFR -     Lipid Panel   There are no diagnoses linked to this encounter.  No orders of the defined types were placed in this encounter.   Follow-up: Return in about 8 weeks (around 03/25/2017) for flu shot.   Boykin Nearing MD

## 2017-01-28 NOTE — Assessment & Plan Note (Signed)
Elevated A1c Low sugar diet Regular exercise Every 6-12 month monitoring

## 2017-01-29 LAB — CMP14+EGFR
ALT: 15 IU/L (ref 0–44)
AST: 19 IU/L (ref 0–40)
Albumin/Globulin Ratio: 1.9 (ref 1.2–2.2)
Albumin: 4.4 g/dL (ref 3.5–5.5)
Alkaline Phosphatase: 47 IU/L (ref 39–117)
BUN/Creatinine Ratio: 22 — ABNORMAL HIGH (ref 9–20)
BUN: 17 mg/dL (ref 6–24)
Bilirubin Total: 0.3 mg/dL (ref 0.0–1.2)
CO2: 25 mmol/L (ref 20–29)
Calcium: 9.5 mg/dL (ref 8.7–10.2)
Chloride: 102 mmol/L (ref 96–106)
Creatinine, Ser: 0.79 mg/dL (ref 0.76–1.27)
GFR calc Af Amer: 117 mL/min/{1.73_m2} (ref >59)
GFR calc non Af Amer: 101 mL/min/{1.73_m2} (ref >59)
Globulin, Total: 2.3 g/dL (ref 1.5–4.5)
Glucose: 116 mg/dL — ABNORMAL HIGH (ref 65–99)
Potassium: 4.2 mmol/L (ref 3.5–5.2)
Sodium: 142 mmol/L (ref 134–144)
Total Protein: 6.7 g/dL (ref 6.0–8.5)

## 2017-01-29 LAB — LIPID PANEL
Chol/HDL Ratio: 3.1 ratio (ref 0.0–5.0)
Cholesterol, Total: 178 mg/dL (ref 100–199)
HDL: 58 mg/dL (ref >39)
LDL Calculated: 91 mg/dL (ref 0–99)
Triglycerides: 145 mg/dL (ref 0–149)
VLDL Cholesterol Cal: 29 mg/dL (ref 5–40)

## 2017-02-04 DIAGNOSIS — Z79891 Long term (current) use of opiate analgesic: Secondary | ICD-10-CM | POA: Diagnosis not present

## 2017-02-04 MED ORDER — ATORVASTATIN CALCIUM 20 MG PO TABS: 20.0000 mg | ORAL_TABLET | Freq: Every day | ORAL | 3 refills | Status: DC

## 2017-02-04 NOTE — Addendum Note (Signed)
Addended by: Dessa PhiFUNCHES, Margerite Impastato on: 02/04/2017 01:26 PM   Modules accepted: Orders

## 2017-02-08 ENCOUNTER — Telehealth: Payer: Self-pay

## 2017-02-08 NOTE — Telephone Encounter (Signed)
Pt was called and a VM was left informing pt to return phone call for lab results. 

## 2017-02-08 NOTE — Telephone Encounter (Signed)
Pt. Returned call and was informed of lab results. Pt. Had no further questions.

## 2017-02-18 ENCOUNTER — Telehealth: Payer: Self-pay

## 2017-02-18 NOTE — Telephone Encounter (Signed)
Pt was called and informed of lab results. 

## 2017-03-09 DIAGNOSIS — Z79891 Long term (current) use of opiate analgesic: Secondary | ICD-10-CM | POA: Diagnosis not present

## 2017-03-25 ENCOUNTER — Ambulatory Visit: Payer: Medicare Other | Admitting: Family Medicine

## 2017-04-15 ENCOUNTER — Ambulatory Visit: Payer: Medicare Other | Admitting: Family Medicine

## 2017-04-20 DIAGNOSIS — Z79891 Long term (current) use of opiate analgesic: Secondary | ICD-10-CM | POA: Diagnosis not present

## 2017-10-28 DIAGNOSIS — M1711 Unilateral primary osteoarthritis, right knee: Secondary | ICD-10-CM | POA: Diagnosis not present

## 2017-10-28 DIAGNOSIS — M25561 Pain in right knee: Secondary | ICD-10-CM | POA: Diagnosis not present

## 2017-11-08 ENCOUNTER — Ambulatory Visit: Payer: Medicare Other | Admitting: Nurse Practitioner

## 2018-01-04 ENCOUNTER — Ambulatory Visit: Payer: Medicare HMO | Attending: Nurse Practitioner | Admitting: Nurse Practitioner

## 2018-01-04 ENCOUNTER — Other Ambulatory Visit: Payer: Self-pay | Admitting: Nurse Practitioner

## 2018-01-04 ENCOUNTER — Encounter: Payer: Self-pay | Admitting: Nurse Practitioner

## 2018-01-04 VITALS — BP 124/85 | HR 76 | Temp 99.0°F | Ht 65.0 in | Wt 166.4 lb

## 2018-01-04 DIAGNOSIS — M1711 Unilateral primary osteoarthritis, right knee: Secondary | ICD-10-CM | POA: Diagnosis not present

## 2018-01-04 DIAGNOSIS — Z89512 Acquired absence of left leg below knee: Secondary | ICD-10-CM | POA: Insufficient documentation

## 2018-01-04 DIAGNOSIS — R7303 Prediabetes: Secondary | ICD-10-CM | POA: Insufficient documentation

## 2018-01-04 DIAGNOSIS — Z760 Encounter for issue of repeat prescription: Secondary | ICD-10-CM | POA: Diagnosis not present

## 2018-01-04 DIAGNOSIS — Z79899 Other long term (current) drug therapy: Secondary | ICD-10-CM | POA: Diagnosis not present

## 2018-01-04 DIAGNOSIS — E782 Mixed hyperlipidemia: Secondary | ICD-10-CM | POA: Diagnosis not present

## 2018-01-04 DIAGNOSIS — Z791 Long term (current) use of non-steroidal anti-inflammatories (NSAID): Secondary | ICD-10-CM | POA: Diagnosis not present

## 2018-01-04 DIAGNOSIS — Z7689 Persons encountering health services in other specified circumstances: Secondary | ICD-10-CM | POA: Diagnosis not present

## 2018-01-04 DIAGNOSIS — I10 Essential (primary) hypertension: Secondary | ICD-10-CM | POA: Diagnosis not present

## 2018-01-04 DIAGNOSIS — Z0000 Encounter for general adult medical examination without abnormal findings: Secondary | ICD-10-CM | POA: Diagnosis not present

## 2018-01-04 DIAGNOSIS — R7309 Other abnormal glucose: Secondary | ICD-10-CM | POA: Diagnosis not present

## 2018-01-04 DIAGNOSIS — S88112S Complete traumatic amputation at level between knee and ankle, left lower leg, sequela: Secondary | ICD-10-CM | POA: Diagnosis not present

## 2018-01-04 LAB — POCT GLYCOSYLATED HEMOGLOBIN (HGB A1C): Hemoglobin A1C: 5.8 % — AB (ref 4.0–5.6)

## 2018-01-04 LAB — GLUCOSE, POCT (MANUAL RESULT ENTRY): POC Glucose: 107 mg/dl — AB (ref 70–99)

## 2018-01-04 MED ORDER — AMLODIPINE BESYLATE 5 MG PO TABS
5.0000 mg | ORAL_TABLET | Freq: Every day | ORAL | 3 refills | Status: DC
Start: 2018-01-04 — End: 2019-02-27

## 2018-01-04 MED ORDER — HYDROCHLOROTHIAZIDE 12.5 MG PO TABS
12.5000 mg | ORAL_TABLET | Freq: Every day | ORAL | 3 refills | Status: DC
Start: 2018-01-04 — End: 2019-02-27

## 2018-01-04 MED ORDER — MELOXICAM 15 MG PO TABS: 15.0000 mg | ORAL_TABLET | Freq: Every day | ORAL | 0 refills | Status: DC

## 2018-01-04 MED ORDER — ATORVASTATIN CALCIUM 20 MG PO TABS: 20.0000 mg | ORAL_TABLET | Freq: Every day | ORAL | 3 refills | Status: DC

## 2018-01-04 NOTE — Progress Notes (Signed)
Assessment & Plan:  Carl Vang was seen today for establish care and medication refill.  Diagnoses and all orders for this visit:  Prediabetes -     Glucose (CBG) -     HgB A1c Continue blood sugar control as discussed in office today, low carbohydrate diet, and regular physical exercise as tolerated, 150 minutes per week (30 min each day, 5 days per week, or 50 min 3 days per week). Annual eye exams and foot exams are recommended. Lab Results  Component Value Date   HGBA1C 5.8 (A) 01/04/2018   Hypertension, unspecified type -     hydrochlorothiazide (HYDRODIURIL) 12.5 MG tablet; Take 1 tablet (12.5 mg total) by mouth daily. -     amLODipine (NORVASC) 5 MG tablet; Take 1 tablet (5 mg total) by mouth daily. -     atorvastatin (LIPITOR) 20 MG tablet; Take 1 tablet (20 mg total) by mouth daily. -     CBC -     CMP14+EGFR Continue all antihypertensives as prescribed.  Remember to bring in your blood pressure log with you for your follow up appointment.  DASH/Mediterranean Diets are healthier choices for HTN.   Traumatic amputation of left leg below knee, sequela (HCC) RESOLVED.   Mixed hyperlipidemia -     Lipid panel INSTRUCTIONS: Work on a low fat, heart healthy diet and participate in regular aerobic exercise program by working out at least 150 minutes per week. No fried foods. No junk foods, sodas, sugary drinks, unhealthy snacking, alcohol or smoking.     Routine adult health maintenance -     PSA  Primary osteoarthritis of right knee -     meloxicam (MOBIC) 15 MG tablet; Take 1 tablet (15 mg total) by mouth daily. Work on losing weight to help reduce back pain. May alternate with heat and ice application for pain relief. May also alternate with acetaminophen as prescribed for pain. Other alternatives include massage, acupuncture and water aerobics.  You must stay active and avoid a sedentary lifestyle.      Patient has been counseled on age-appropriate routine health  concerns for screening and prevention. These are reviewed and up-to-date. Referrals have been placed accordingly. Immunizations are up-to-date or declined.    Subjective:   Chief Complaint  Patient presents with  . Establish Care    Pt. is here to establish care for hypertension.   . Medication Refill   HPI Carl Vang 57 y.o. male presents to office today to establish care as a new patient. He has a history of HTN, HPL, OA of right knee and L BKA (traumatic work accident).   Right Knee Pain S/P R knee arthroscopy over 1.5 years ago. He does not want to have surgery however he states the pain in his knee is moderate along with swelling. Aggravating factors: walking, bending, twisting, turning. Will prescribe meloxicam today. He has taken percocet tylenol #3 and tramadol in the past.   CHRONIC HYPERTENSION Disease Monitoring  Blood pressure range: He does not have a monitor at home to check his blood pressure.  BP Readings from Last 3 Encounters:  01/04/18 124/85  01/28/17 112/65  08/25/16 138/72    Chest pain: no   Dyspnea: no   Claudication: no  Medication compliance: yes; taking HCTZ 12.50m and norvasc 536mdaily.  Medication Side Effects  Lightheadedness: no   Urinary frequency: no   Edema: no   Impotence: no  Preventitive Healthcare:  Exercise: no   Diet Pattern: salt not added  to cooking  Salt Restriction:  Yes  Hyperlipidemia Patient presents for follow up to hyperlipidemia.  He is medication compliant taking atorvastatin 51m daily. He is diet compliant and denies skin xanthelasma or statin intolerance including myalgias.  LDL not at goal. Awaiting fasting lipid panel results.  Lab Results  Component Value Date   CHOL 178 01/28/2017   Lab Results  Component Value Date   HDL 58 01/28/2017   Lab Results  Component Value Date   LDLCALC 91 01/28/2017   Lab Results  Component Value Date   TRIG 145 01/28/2017   Lab Results  Component Value Date   CHOLHDL  3.1 01/28/2017    ROS  Past Medical History:  Diagnosis Date  . Hyperlipidemia   . Hypertension   . Medical history non-contributory   . Prediabetes     Past Surgical History:  Procedure Laterality Date  . COLONOSCOPY WITH PROPOFOL N/A 08/06/2014   Procedure: COLONOSCOPY WITH PROPOFOL;  Surgeon: MGarlan Fair MD;  Location: [facility];  Service: Endoscopy;  Laterality: N/A;  . left leg below knee amputation   2003   in accident at work   . MOUTH SURGERY      Family History  Problem Relation Age of Onset  . Diabetes Mother   . Hypertension Mother   . Cancer Mother   . Stroke Mother   . Diabetes Father   . Hypertension Father   . Diabetes Daughter   . Hypertension Daughter   . Heart disease Daughter     Social History Reviewed with no changes to be made today.   Outpatient Medications Prior to Visit  Medication Sig Dispense Refill  . amLODipine (NORVASC) 5 MG tablet Take 1 tablet (5 mg total) by mouth daily. 90 tablet 3  . atorvastatin (LIPITOR) 20 MG tablet Take 1 tablet (20 mg total) by mouth daily. 90 tablet 3  . hydrochlorothiazide (HYDRODIURIL) 12.5 MG tablet Take 1 tablet (12.5 mg total) by mouth daily. 90 tablet 3  . meloxicam (MOBIC) 15 MG tablet     . oxyCODONE-acetaminophen (PERCOCET) 5-325 MG tablet Take 1 tablet by mouth 2 (two) times daily as needed for severe pain. Per Dr. WRicke Hey    No facility-administered medications prior to visit.     No Known Allergies     Objective:    BP 124/85 (BP Location: Right Arm, Patient Position: Sitting, Cuff Size: Normal)   Pulse 76   Temp 99 F (37.2 C) (Oral)   Ht _0  (1.651 m)   Wt 166 lb 6.4 oz (75.5 kg)   SpO2 96%   BMI 27.69 kg/m  Wt Readings from Last 3 Encounters:  01/04/18 166 lb 6.4 oz (75.5 kg)  01/28/17 167 lb 6.4 oz (75.9 kg)  08/25/16 195 lb 12.8 oz (88.8 kg)    Physical Exam       Patient has been counseled extensively about nutrition and exercise as well as the  importance of adherence with medications and regular follow-up. The patient was given clear instructions to go to ER or return to medical center if symptoms don't improve, worsen or new problems develop. The patient verbalized understanding.   Follow-up: Return for Physical ONLY no labs.   ZGildardo Pounds FNP-BC CSan [facility] WBarnardGSan Ygnacio NOslo  01/04/2018, 12:59 PM

## 2018-01-04 NOTE — Addendum Note (Signed)
Addended by: Paschal DoppWHITE, Victoria Euceda J on: 01/04/2018 04:52 PM   Modules accepted: Orders

## 2018-01-04 NOTE — Patient Instructions (Signed)

## 2018-01-05 LAB — CMP14+EGFR
ALT: 15 IU/L (ref 0–44)
AST: 20 IU/L (ref 0–40)
Albumin/Globulin Ratio: 2 (ref 1.2–2.2)
Albumin: 4.5 g/dL (ref 3.5–5.5)
Alkaline Phosphatase: 68 IU/L (ref 39–117)
BUN/Creatinine Ratio: 12 (ref 9–20)
BUN: 11 mg/dL (ref 6–24)
Bilirubin Total: 0.4 mg/dL (ref 0.0–1.2)
CO2: 22 mmol/L (ref 20–29)
Calcium: 9.4 mg/dL (ref 8.7–10.2)
Chloride: 102 mmol/L (ref 96–106)
Creatinine, Ser: 0.91 mg/dL (ref 0.76–1.27)
GFR calc Af Amer: 109 mL/min/{1.73_m2} (ref >59)
GFR calc non Af Amer: 94 mL/min/{1.73_m2} (ref >59)
Globulin, Total: 2.3 g/dL (ref 1.5–4.5)
Glucose: 96 mg/dL (ref 65–99)
Potassium: 3.9 mmol/L (ref 3.5–5.2)
Sodium: 139 mmol/L (ref 134–144)
Total Protein: 6.8 g/dL (ref 6.0–8.5)

## 2018-01-05 LAB — LIPID PANEL
Chol/HDL Ratio: 3 ratio (ref 0.0–5.0)
Cholesterol, Total: 141 mg/dL (ref 100–199)
HDL: 47 mg/dL (ref >39)
LDL Calculated: 82 mg/dL (ref 0–99)
Triglycerides: 58 mg/dL (ref 0–149)
VLDL Cholesterol Cal: 12 mg/dL (ref 5–40)

## 2018-01-05 LAB — CBC
Hematocrit: 37.6 % (ref 37.5–51.0)
Hemoglobin: 12.6 g/dL — ABNORMAL LOW (ref 13.0–17.7)
MCH: 28.5 pg (ref 26.6–33.0)
MCHC: 33.5 g/dL (ref 31.5–35.7)
MCV: 85 fL (ref 79–97)
Platelets: 257 10*3/uL (ref 150–450)
RBC: 4.42 x10E6/uL (ref 4.14–5.80)
RDW: 14 % (ref 12.3–15.4)
WBC: 6.8 10*3/uL (ref 3.4–10.8)

## 2018-01-05 LAB — PSA: Prostate Specific Ag, Serum: 3.6 ng/mL (ref 0.0–4.0)

## 2018-01-05 LAB — HIV ANTIBODY (ROUTINE TESTING W REFLEX): HIV Screen 4th Generation wRfx: NONREACTIVE

## 2018-01-21 ENCOUNTER — Telehealth: Payer: Self-pay

## 2018-01-21 NOTE — Telephone Encounter (Signed)
Will route to PCP 

## 2018-01-21 NOTE — Telephone Encounter (Signed)
Patient called to check the status of paperwork(handicapp form, and PCS form).

## 2018-01-23 NOTE — Telephone Encounter (Signed)
Will be ready on Friday 01-28-2018

## 2018-01-24 NOTE — Telephone Encounter (Signed)
CMA spoke to patient to inform paperwork will be ready on Friday. Patient understood.

## 2018-02-04 ENCOUNTER — Encounter: Payer: Medicare HMO | Admitting: Nurse Practitioner

## 2018-02-10 DIAGNOSIS — M1711 Unilateral primary osteoarthritis, right knee: Secondary | ICD-10-CM | POA: Diagnosis not present

## 2018-02-10 DIAGNOSIS — M25561 Pain in right knee: Secondary | ICD-10-CM | POA: Diagnosis not present

## 2018-03-02 ENCOUNTER — Encounter: Payer: Medicare HMO | Admitting: Nurse Practitioner

## 2018-03-18 ENCOUNTER — Telehealth: Payer: Self-pay | Admitting: Nurse Practitioner

## 2018-03-18 NOTE — Telephone Encounter (Signed)
Will route to PCP 

## 2018-03-18 NOTE — Telephone Encounter (Signed)
Patient came in today to drop off paperwork he needs filled out by [employer]elda Fleming. Please follow up with patient once paperwork is ready. Patient will also be scheduling a follow-up appointment today to see Dr.Fleming.

## 2018-03-21 ENCOUNTER — Other Ambulatory Visit: Payer: Self-pay | Admitting: Orthopedic Surgery

## 2018-03-23 NOTE — Telephone Encounter (Signed)
CMA faxed the form to [facility] [phone]  CMA attempt to call patient to inform that Paper work is filled out.  Unable to reach patient due to call cannot be completed.

## 2018-03-31 ENCOUNTER — Other Ambulatory Visit (HOSPITAL_COMMUNITY): Payer: Self-pay | Admitting: *Deleted

## 2018-03-31 NOTE — Patient Instructions (Addendum)
Carl Vang  03/31/2018   Your procedure is scheduled on: 04-08-18  Report to [facility] Main  Entrance  Report to admitting at 730 AM    Call this number if you have problems the morning of surgery [phone]   Remember: Do not eat food or drink liquids :After Midnight. BRUSH YOUR TEETH MORNING OF SURGERY AND RINSE YOUR MOUTH OUT, NO CHEWING GUM CANDY OR MINTS.     Take these medicines the morning of surgery with A SIP OF WATER: none                               You may not have any metal on your body including hair pins and              piercings  Do not wear jewelry, make-up, lotions, powders or perfumes, deodorant             Do not wear nail polish.  Do not shave  48 hours prior to surgery.              Men may shave face and neck.   Do not bring valuables to the hospital. [facility] IS NOT             RESPONSIBLE   FOR VALUABLES.  Contacts, dentures or bridgework may not be worn into surgery.  Leave suitcase in the car. After surgery it may be brought to your room.                  Please read over the following fact sheets you were given: _____________________________________________________________________             [facility] - Preparing for Surgery Before surgery, you can play an important role.  Because skin is not sterile, your skin needs to be as free of germs as possible.  You can reduce the number of germs on your skin by washing with CHG (chlorahexidine gluconate) soap before surgery.  CHG is an antiseptic cleaner which kills germs and bonds with the skin to continue killing germs even after washing. Please DO NOT use if you have an allergy to CHG or antibacterial soaps.  If your skin becomes reddened/irritated stop using the CHG and inform your nurse when you arrive at Short Stay. Do not shave (including legs and underarms) for at least 48 hours prior to the first CHG shower.  You may shave your face/neck. Please follow these  instructions carefully:  1.  Shower with CHG Soap the night before surgery and the  morning of Surgery.  2.  If you choose to wash your hair, wash your hair first as usual with your  normal  shampoo.  3.  After you shampoo, rinse your hair and body thoroughly to remove the  shampoo.                           4.  Use CHG as you would any other liquid soap.  You can apply chg directly  to the skin and wash                       Gently with a scrungie or clean washcloth.  5.  Apply the CHG Soap to your body ONLY FROM THE NECK DOWN.  Do not use on face/ open                           Wound or open sores. Avoid contact with eyes, ears mouth and genitals (private parts).                       Wash face,  Genitals (private parts) with your normal soap.             6.  Wash thoroughly, paying special attention to the area where your surgery  will be performed.  7.  Thoroughly rinse your body with warm water from the neck down.  8.  DO NOT shower/wash with your normal soap after using and rinsing off  the CHG Soap.                9.  Pat yourself dry with a clean towel.            10.  Wear clean pajamas.            11.  Place clean sheets on your bed the night of your first shower and do not  sleep with pets. Day of Surgery : Do not apply any lotions/deodorants the morning of surgery.  Please wear clean clothes to the hospital/surgery center.  FAILURE TO FOLLOW THESE INSTRUCTIONS MAY RESULT IN THE CANCELLATION OF YOUR SURGERY PATIENT SIGNATURE_________________________________  NURSE SIGNATURE__________________________________  ________________________________________________________________________   Carl Vang  An incentive spirometer is a tool that can help keep your lungs clear and active. This tool measures how well you are filling your lungs with each breath. Taking long deep breaths may help reverse or decrease the chance of developing breathing (pulmonary) problems (especially  infection) following:  A long period of time when you are unable to move or be active. BEFORE THE PROCEDURE   If the spirometer includes an indicator to show your best effort, your nurse or respiratory therapist will set it to a desired goal.  If possible, sit up straight or lean slightly forward. Try not to slouch.  Hold the incentive spirometer in an upright position. INSTRUCTIONS FOR USE  1. Sit on the edge of your bed if possible, or sit up as far as you can in bed or on a chair. 2. Hold the incentive spirometer in an upright position. 3. Breathe out normally. 4. Place the mouthpiece in your mouth and seal your lips tightly around it. 5. Breathe in slowly and as deeply as possible, raising the piston or the ball toward the top of the column. 6. Hold your breath for 3-5 seconds or for as long as possible. Allow the piston or ball to fall to the bottom of the column. 7. Remove the mouthpiece from your mouth and breathe out normally. 8. Rest for a few seconds and repeat Steps 1 through 7 at least 10 times every 1-2 hours when you are awake. Take your time and take a few normal breaths between deep breaths. 9. The spirometer may include an indicator to show your best effort. Use the indicator as a goal to work toward during each repetition. 10. After each set of 10 deep breaths, practice coughing to be sure your lungs are clear. If you have an incision (the cut made at the time of surgery), support your incision when coughing by placing a pillow or rolled up towels firmly against it. Once you are able to get out of  bed, walk around indoors and cough well. You may stop using the incentive spirometer when instructed by your caregiver.  RISKS AND COMPLICATIONS  Take your time so you do not get dizzy or light-headed.  If you are in pain, you may need to take or ask for pain medication before doing incentive spirometry. It is harder to take a deep breath if you are having pain. AFTER  USE  Rest and breathe slowly and easily.  It can be helpful to keep track of a log of your progress. Your caregiver can provide you with a simple table to help with this. If you are using the spirometer at home, follow these instructions: [place] IF:   You are having difficultly using the spirometer.  You have trouble using the spirometer as often as instructed.  Your pain medication is not giving enough relief while using the spirometer.  You develop fever of 100.5 F (38.1 C) or higher. SEEK IMMEDIATE MEDICAL CARE IF:   You cough up bloody sputum that had not been present before.  You develop fever of 102 F (38.9 C) or greater.  You develop worsening pain at or near the incision site. MAKE SURE YOU:   Understand these instructions.  Will watch your condition.  Will get help right away if you are not doing well or get worse. Document Released: 11/02/2006 Document Revised: 09/14/2011 Document Reviewed: 01/03/2007 ExitCare Patient Information 2014 ExitCare, [place].   ________________________________________________________________________  WHAT IS A BLOOD TRANSFUSION? Blood Transfusion Information  A transfusion is the replacement of blood or some of its parts. Blood is made up of multiple cells which provide different functions.  Red blood cells carry oxygen and are used for blood loss replacement.  White blood cells fight against infection.  Platelets control bleeding.  Plasma helps clot blood.  Other blood products are available for specialized needs, such as hemophilia or other clotting disorders. BEFORE THE TRANSFUSION  Who gives blood for transfusions?   Healthy volunteers who are fully evaluated to make sure their blood is safe. This is blood bank blood. Transfusion therapy is the safest it has ever been in the practice of medicine. Before blood is taken from a donor, a complete history is taken to make sure that person has no history of diseases  nor engages in risky social behavior (examples are intravenous drug use or sexual activity with multiple partners). The donor's travel history is screened to minimize risk of transmitting infections, such as malaria. The donated blood is tested for signs of infectious diseases, such as HIV and hepatitis. The blood is then tested to be sure it is compatible with you in order to minimize the chance of a transfusion reaction. If you or a relative donates blood, this is often done in anticipation of surgery and is not appropriate for emergency situations. It takes many days to process the donated blood. RISKS AND COMPLICATIONS Although transfusion therapy is very safe and saves many lives, the main dangers of transfusion include:   Getting an infectious disease.  Developing a transfusion reaction. This is an allergic reaction to something in the blood you were given. Every precaution is taken to prevent this. The decision to have a blood transfusion has been considered carefully by your caregiver before blood is given. Blood is not given unless the benefits outweigh the risks. AFTER THE TRANSFUSION  Right after receiving a blood transfusion, you will usually feel much better and more energetic. This is especially true if your red blood  cells have gotten low (anemic). The transfusion raises the level of the red blood cells which carry oxygen, and this usually causes an energy increase.  The nurse administering the transfusion will monitor you carefully for complications. HOME CARE INSTRUCTIONS  No special instructions are needed after a transfusion. You may find your energy is better. Speak with your caregiver about any limitations on activity for underlying diseases you may have. SEEK MEDICAL CARE IF:   Your condition is not improving after your transfusion.  You develop redness or irritation at the intravenous (IV) site. SEEK IMMEDIATE MEDICAL CARE IF:  Any of the following symptoms occur over the  next 12 hours:  Shaking chills.  You have a temperature by mouth above 102 F (38.9 C), not controlled by medicine.  Chest, back, or muscle pain.  People around you feel you are not acting correctly or are confused.  Shortness of breath or difficulty breathing.  Dizziness and fainting.  You get a rash or develop hives.  You have a decrease in urine output.  Your urine turns a dark color or changes to pink, red, or Jacober. Any of the following symptoms occur over the next 10 days:  You have a temperature by mouth above 102 F (38.9 C), not controlled by medicine.  Shortness of breath.  Weakness after normal activity.  The white part of the eye turns yellow (jaundice).  You have a decrease in the amount of urine or are urinating less often.  Your urine turns a dark color or changes to pink, red, or Stober. Document Released: 06/19/2000 Document Revised: 09/14/2011 Document Reviewed: 02/06/2008 [facility] Patient Information 2014 [place], [place].  _______________________________________________________________________

## 2018-04-04 ENCOUNTER — Ambulatory Visit (HOSPITAL_COMMUNITY)
Admission: RE | Admit: 2018-04-04 | Discharge: 2018-04-04 | Disposition: A | Discharge status: Home / Self Care | Payer: Medicare HMO | Source: Ambulatory Visit | Attending: Orthopedic Surgery | Admitting: Orthopedic Surgery

## 2018-04-04 ENCOUNTER — Other Ambulatory Visit: Payer: Self-pay

## 2018-04-04 ENCOUNTER — Encounter (HOSPITAL_COMMUNITY)
Admission: RE | Admit: 2018-04-04 | Discharge: 2018-04-04 | Disposition: A | Discharge status: Home / Self Care | Payer: Medicare HMO | Source: Ambulatory Visit | Attending: Orthopedic Surgery | Admitting: Orthopedic Surgery

## 2018-04-04 ENCOUNTER — Encounter (HOSPITAL_COMMUNITY): Payer: Self-pay

## 2018-04-04 DIAGNOSIS — M1711 Unilateral primary osteoarthritis, right knee: Secondary | ICD-10-CM | POA: Insufficient documentation

## 2018-04-04 DIAGNOSIS — R9431 Abnormal electrocardiogram [ECG] [EKG]: Secondary | ICD-10-CM | POA: Insufficient documentation

## 2018-04-04 DIAGNOSIS — R0989 Other specified symptoms and signs involving the circulatory and respiratory systems: Secondary | ICD-10-CM | POA: Diagnosis not present

## 2018-04-04 DIAGNOSIS — Z01818 Encounter for other preprocedural examination: Secondary | ICD-10-CM | POA: Diagnosis not present

## 2018-04-04 LAB — COMPREHENSIVE METABOLIC PANEL
ALT: 16 U/L (ref 0–44)
AST: 21 U/L (ref 15–41)
Albumin: 4.3 g/dL (ref 3.5–5.0)
Alkaline Phosphatase: 59 U/L (ref 38–126)
Anion gap: 7 (ref 5–15)
BUN: 12 mg/dL (ref 6–20)
CO2: 30 mmol/L (ref 22–32)
Calcium: 9.5 mg/dL (ref 8.9–10.3)
Chloride: 105 mmol/L (ref 98–111)
Creatinine, Ser: 0.95 mg/dL (ref 0.61–1.24)
GFR calc Af Amer: >60 mL/min (ref >60)
GFR calc non Af Amer: >60 mL/min (ref >60)
Glucose, Bld: 98 mg/dL (ref 70–99)
Potassium: 3.7 mmol/L (ref 3.5–5.1)
Sodium: 142 mmol/L (ref 135–145)
Total Bilirubin: 0.9 mg/dL (ref 0.3–1.2)
Total Protein: 7 g/dL (ref 6.5–8.1)

## 2018-04-04 LAB — CBC WITH DIFFERENTIAL/PLATELET
Basophils Absolute: 0 10*3/uL (ref 0.0–0.1)
Basophils Relative: 0 %
Eosinophils Absolute: 0.5 10*3/uL (ref 0.0–0.7)
Eosinophils Relative: 6 %
HCT: 37.2 % — ABNORMAL LOW (ref 39.0–52.0)
Hemoglobin: 12.5 g/dL — ABNORMAL LOW (ref 13.0–17.0)
Lymphocytes Relative: 45 %
Lymphs Abs: 3.7 10*3/uL (ref 0.7–4.0)
MCH: 28.8 pg (ref 26.0–34.0)
MCHC: 33.6 g/dL (ref 30.0–36.0)
MCV: 85.7 fL (ref 78.0–100.0)
Monocytes Absolute: 0.4 10*3/uL (ref 0.1–1.0)
Monocytes Relative: 5 %
Neutro Abs: 3.6 10*3/uL (ref 1.7–7.7)
Neutrophils Relative %: 44 %
Platelets: 268 10*3/uL (ref 150–400)
RBC: 4.34 MIL/uL (ref 4.22–5.81)
RDW: 13.2 % (ref 11.5–15.5)
WBC: 8.3 10*3/uL (ref 4.0–10.5)

## 2018-04-04 LAB — URINALYSIS, ROUTINE W REFLEX MICROSCOPIC
Bacteria, UA: NONE SEEN
Bilirubin Urine: NEGATIVE
Glucose, UA: NEGATIVE mg/dL
Hgb urine dipstick: NEGATIVE
Ketones, ur: NEGATIVE mg/dL
Nitrite: NEGATIVE
Protein, ur: NEGATIVE mg/dL
Specific Gravity, Urine: 1.018 (ref 1.005–1.030)
pH: 5 (ref 5.0–8.0)

## 2018-04-04 LAB — PROTIME-INR
INR: 1.03
Prothrombin Time: 13.4 seconds (ref 11.4–15.2)

## 2018-04-04 LAB — ABO/RH: ABO/RH(D): A POS

## 2018-04-04 LAB — APTT: aPTT: 31 seconds (ref 24–36)

## 2018-04-04 LAB — SURGICAL PCR SCREEN
MRSA, PCR: NEGATIVE
Staphylococcus aureus: NEGATIVE

## 2018-04-05 LAB — HEMOGLOBIN A1C
Hgb A1c MFr Bld: 5.9 % — ABNORMAL HIGH (ref 4.8–5.6)
Mean Plasma Glucose: 123 mg/dL

## 2018-04-08 ENCOUNTER — Encounter (HOSPITAL_COMMUNITY)
Admission: RE | Disposition: A | Discharge status: Home Health | Payer: Self-pay | Source: Ambulatory Visit | Attending: Orthopedic Surgery

## 2018-04-08 ENCOUNTER — Other Ambulatory Visit: Payer: Self-pay

## 2018-04-08 ENCOUNTER — Telehealth (HOSPITAL_COMMUNITY): Payer: Self-pay | Admitting: *Deleted

## 2018-04-08 ENCOUNTER — Inpatient Hospital Stay (HOSPITAL_COMMUNITY)
Admission: RE | Admit: 2018-04-08 | Discharge: 2018-04-11 | DRG: 470 | Disposition: A | Discharge status: Home Health | Payer: Medicare HMO | Source: Ambulatory Visit | Attending: Orthopedic Surgery | Admitting: Orthopedic Surgery

## 2018-04-08 ENCOUNTER — Ambulatory Visit (HOSPITAL_COMMUNITY): Payer: Medicare HMO | Admitting: Anesthesiology

## 2018-04-08 ENCOUNTER — Encounter (HOSPITAL_COMMUNITY): Payer: Self-pay | Admitting: *Deleted

## 2018-04-08 DIAGNOSIS — Z79899 Other long term (current) drug therapy: Secondary | ICD-10-CM | POA: Diagnosis not present

## 2018-04-08 DIAGNOSIS — Z791 Long term (current) use of non-steroidal anti-inflammatories (NSAID): Secondary | ICD-10-CM

## 2018-04-08 DIAGNOSIS — I10 Essential (primary) hypertension: Secondary | ICD-10-CM | POA: Diagnosis not present

## 2018-04-08 DIAGNOSIS — M1711 Unilateral primary osteoarthritis, right knee: Secondary | ICD-10-CM | POA: Diagnosis not present

## 2018-04-08 DIAGNOSIS — E785 Hyperlipidemia, unspecified: Secondary | ICD-10-CM | POA: Diagnosis present

## 2018-04-08 DIAGNOSIS — Z89512 Acquired absence of left leg below knee: Secondary | ICD-10-CM | POA: Diagnosis not present

## 2018-04-08 DIAGNOSIS — R7303 Prediabetes: Secondary | ICD-10-CM | POA: Diagnosis not present

## 2018-04-08 DIAGNOSIS — Z7982 Long term (current) use of aspirin: Secondary | ICD-10-CM

## 2018-04-08 DIAGNOSIS — G8918 Other acute postprocedural pain: Secondary | ICD-10-CM | POA: Diagnosis not present

## 2018-04-08 DIAGNOSIS — S88112S Complete traumatic amputation at level between knee and ankle, left lower leg, sequela: Secondary | ICD-10-CM

## 2018-04-08 DIAGNOSIS — Z96651 Presence of right artificial knee joint: Secondary | ICD-10-CM | POA: Diagnosis not present

## 2018-04-08 HISTORY — PX: TOTAL KNEE ARTHROPLASTY: SHX125

## 2018-04-08 LAB — TYPE AND SCREEN
ABO/RH(D): A POS
Antibody Screen: NEGATIVE

## 2018-04-08 LAB — GLUCOSE, CAPILLARY: Glucose-Capillary: 107 mg/dL — ABNORMAL HIGH (ref 70–99)

## 2018-04-08 SURGERY — ARTHROPLASTY, KNEE, TOTAL
Anesthesia: Spinal | Site: Knee | Laterality: Right

## 2018-04-08 MED ORDER — FENTANYL CITRATE (PF) 100 MCG/2ML IJ SOLN
INTRAMUSCULAR | Status: AC
  Filled 2018-04-08: qty 2

## 2018-04-08 MED ORDER — CHLORHEXIDINE GLUCONATE 4 % EX LIQD: 60.0000 mL | Freq: Once | CUTANEOUS | Status: DC

## 2018-04-08 MED ORDER — BUPIVACAINE LIPOSOME 1.3 % IJ SUSP
20.0000 mL | Freq: Once | INTRAMUSCULAR | Status: DC
  Filled 2018-04-08: qty 20

## 2018-04-08 MED ORDER — DEXAMETHASONE SODIUM PHOSPHATE 10 MG/ML IJ SOLN
INTRAMUSCULAR | Status: DC | PRN
  Administered 2018-04-08: 4 mg via INTRAVENOUS

## 2018-04-08 MED ORDER — PROPOFOL 10 MG/ML IV BOLUS
INTRAVENOUS | Status: AC
  Filled 2018-04-08: qty 60

## 2018-04-08 MED ORDER — METHOCARBAMOL 500 MG IVPB - SIMPLE MED
500.0000 mg | Freq: Four times a day (QID) | INTRAVENOUS | Status: DC | PRN
  Filled 2018-04-08: qty 50

## 2018-04-08 MED ORDER — OXYCODONE HCL 5 MG PO TABS
5.0000 mg | ORAL_TABLET | ORAL | Status: DC | PRN
  Administered 2018-04-08: 5 mg via ORAL
  Administered 2018-04-09: 10 mg via ORAL
  Administered 2018-04-10: 5 mg via ORAL
  Filled 2018-04-08: qty 2
  Filled 2018-04-08 (×3): qty 1

## 2018-04-08 MED ORDER — TRANEXAMIC ACID 1000 MG/10ML IV SOLN
1000.0000 mg | Freq: Once | INTRAVENOUS | Status: AC
  Administered 2018-04-08: 1000 mg via INTRAVENOUS
  Filled 2018-04-08: qty 1000

## 2018-04-08 MED ORDER — ASPIRIN EC 325 MG PO TBEC
325.0000 mg | DELAYED_RELEASE_TABLET | Freq: Two times a day (BID) | ORAL | Status: DC
  Administered 2018-04-08 – 2018-04-11 (×6): 325 mg via ORAL
  Filled 2018-04-08 (×6): qty 1

## 2018-04-08 MED ORDER — OXYCODONE HCL 5 MG PO TABS: 5.0000 mg | ORAL_TABLET | Freq: Once | ORAL | Status: DC | PRN

## 2018-04-08 MED ORDER — ONDANSETRON HCL 4 MG PO TABS: 4.0000 mg | ORAL_TABLET | Freq: Four times a day (QID) | ORAL | Status: DC | PRN

## 2018-04-08 MED ORDER — ASPIRIN EC 325 MG PO TBEC: 325.0000 mg | DELAYED_RELEASE_TABLET | Freq: Two times a day (BID) | ORAL | 0 refills | Status: DC

## 2018-04-08 MED ORDER — MIDAZOLAM HCL 2 MG/2ML IJ SOLN
INTRAMUSCULAR | Status: AC
  Administered 2018-04-08: 1 mg via INTRAVENOUS
  Filled 2018-04-08: qty 2

## 2018-04-08 MED ORDER — PROPOFOL 500 MG/50ML IV EMUL
INTRAVENOUS | Status: DC | PRN
  Administered 2018-04-08: 75 ug/kg/min via INTRAVENOUS

## 2018-04-08 MED ORDER — CELECOXIB 200 MG PO CAPS
200.0000 mg | ORAL_CAPSULE | Freq: Two times a day (BID) | ORAL | Status: DC
  Administered 2018-04-08 – 2018-04-11 (×6): 200 mg via ORAL
  Filled 2018-04-08 (×6): qty 1

## 2018-04-08 MED ORDER — ONDANSETRON HCL 4 MG/2ML IJ SOLN: 4.0000 mg | Freq: Four times a day (QID) | INTRAMUSCULAR | Status: DC | PRN

## 2018-04-08 MED ORDER — STERILE WATER FOR IRRIGATION IR SOLN
Status: DC | PRN
  Administered 2018-04-08: 2000 mL

## 2018-04-08 MED ORDER — BUPIVACAINE-EPINEPHRINE (PF) 0.5% -1:200000 IJ SOLN
INTRAMUSCULAR | Status: AC
  Filled 2018-04-08: qty 30

## 2018-04-08 MED ORDER — FENTANYL CITRATE (PF) 100 MCG/2ML IJ SOLN
INTRAMUSCULAR | Status: AC
  Administered 2018-04-08: 50 ug via INTRAVENOUS
  Filled 2018-04-08: qty 2

## 2018-04-08 MED ORDER — CEFAZOLIN SODIUM-DEXTROSE 2-4 GM/100ML-% IV SOLN
2.0000 g | Freq: Four times a day (QID) | INTRAVENOUS | Status: AC
  Administered 2018-04-08 (×2): 2 g via INTRAVENOUS
  Filled 2018-04-08 (×2): qty 100

## 2018-04-08 MED ORDER — DEXAMETHASONE SODIUM PHOSPHATE 10 MG/ML IJ SOLN
10.0000 mg | Freq: Two times a day (BID) | INTRAMUSCULAR | Status: AC
  Administered 2018-04-08 – 2018-04-09 (×3): 10 mg via INTRAVENOUS
  Filled 2018-04-08 (×3): qty 1

## 2018-04-08 MED ORDER — HYDROCHLOROTHIAZIDE 25 MG PO TABS
12.5000 mg | ORAL_TABLET | Freq: Every evening | ORAL | Status: DC
  Administered 2018-04-08 – 2018-04-10 (×3): 12.5 mg via ORAL
  Filled 2018-04-08 (×3): qty 1

## 2018-04-08 MED ORDER — BISACODYL 5 MG PO TBEC
5.0000 mg | DELAYED_RELEASE_TABLET | Freq: Every day | ORAL | Status: DC | PRN
  Administered 2018-04-09: 5 mg via ORAL
  Filled 2018-04-08: qty 1

## 2018-04-08 MED ORDER — AMLODIPINE BESYLATE 5 MG PO TABS
5.0000 mg | ORAL_TABLET | Freq: Every evening | ORAL | Status: DC
  Administered 2018-04-08 – 2018-04-10 (×3): 5 mg via ORAL
  Filled 2018-04-08 (×3): qty 1

## 2018-04-08 MED ORDER — BUPIVACAINE-EPINEPHRINE 0.5% -1:200000 IJ SOLN
INTRAMUSCULAR | Status: DC | PRN
  Administered 2018-04-08: 30 mL

## 2018-04-08 MED ORDER — OXYCODONE-ACETAMINOPHEN 5-325 MG PO TABS: 1.0000 | ORAL_TABLET | Freq: Four times a day (QID) | ORAL | 0 refills | Status: DC | PRN

## 2018-04-08 MED ORDER — ALUM & MAG HYDROXIDE-SIMETH 200-200-20 MG/5ML PO SUSP
30.0000 mL | ORAL | Status: DC | PRN
  Administered 2018-04-10 (×2): 30 mL via ORAL
  Filled 2018-04-08 (×2): qty 30

## 2018-04-08 MED ORDER — MIDAZOLAM HCL 2 MG/2ML IJ SOLN
INTRAMUSCULAR | Status: AC
  Filled 2018-04-08: qty 2

## 2018-04-08 MED ORDER — ONDANSETRON HCL 4 MG/2ML IJ SOLN: 4.0000 mg | Freq: Once | INTRAMUSCULAR | Status: DC | PRN

## 2018-04-08 MED ORDER — TIZANIDINE HCL 2 MG PO TABS: 2.0000 mg | ORAL_TABLET | Freq: Three times a day (TID) | ORAL | 0 refills | Status: DC | PRN

## 2018-04-08 MED ORDER — DIPHENHYDRAMINE HCL 12.5 MG/5ML PO ELIX: 12.5000 mg | ORAL_SOLUTION | ORAL | Status: DC | PRN

## 2018-04-08 MED ORDER — 0.9 % SODIUM CHLORIDE (POUR BTL) OPTIME
TOPICAL | Status: DC | PRN
  Administered 2018-04-08: 1000 mL

## 2018-04-08 MED ORDER — ACETAMINOPHEN 325 MG PO TABS
325.0000 mg | ORAL_TABLET | Freq: Four times a day (QID) | ORAL | Status: DC | PRN
  Administered 2018-04-09: 325 mg via ORAL
  Filled 2018-04-08: qty 1

## 2018-04-08 MED ORDER — POLYETHYLENE GLYCOL 3350 17 G PO PACK: 17.0000 g | PACK | Freq: Every day | ORAL | Status: DC | PRN

## 2018-04-08 MED ORDER — FENTANYL CITRATE (PF) 100 MCG/2ML IJ SOLN
50.0000 ug | INTRAMUSCULAR | Status: DC
  Administered 2018-04-08: 50 ug via INTRAVENOUS

## 2018-04-08 MED ORDER — OXYCODONE HCL 5 MG/5ML PO SOLN
5.0000 mg | Freq: Once | ORAL | Status: DC | PRN
  Filled 2018-04-08: qty 5

## 2018-04-08 MED ORDER — CEFAZOLIN SODIUM-DEXTROSE 2-4 GM/100ML-% IV SOLN
2.0000 g | INTRAVENOUS | Status: AC
  Administered 2018-04-08: 2 g via INTRAVENOUS
  Filled 2018-04-08: qty 100

## 2018-04-08 MED ORDER — ONDANSETRON HCL 4 MG/2ML IJ SOLN
INTRAMUSCULAR | Status: DC | PRN
  Administered 2018-04-08: 4 mg via INTRAVENOUS

## 2018-04-08 MED ORDER — FENTANYL CITRATE (PF) 100 MCG/2ML IJ SOLN
25.0000 ug | INTRAMUSCULAR | Status: DC | PRN
  Administered 2018-04-08 (×2): 50 ug via INTRAVENOUS

## 2018-04-08 MED ORDER — LACTATED RINGERS IV SOLN
INTRAVENOUS | Status: DC
  Administered 2018-04-08: 09:00:00 via INTRAVENOUS
  Administered 2018-04-08: 1000 mL via INTRAVENOUS
  Administered 2018-04-08: 11:00:00 via INTRAVENOUS

## 2018-04-08 MED ORDER — BUPIVACAINE LIPOSOME 1.3 % IJ SUSP
INTRAMUSCULAR | Status: DC | PRN
  Administered 2018-04-08: 20 mL

## 2018-04-08 MED ORDER — LIDOCAINE HCL (CARDIAC) PF 100 MG/5ML IV SOSY
PREFILLED_SYRINGE | INTRAVENOUS | Status: DC | PRN
  Administered 2018-04-08: 20 mg via INTRATRACHEAL

## 2018-04-08 MED ORDER — GABAPENTIN 300 MG PO CAPS
300.0000 mg | ORAL_CAPSULE | Freq: Two times a day (BID) | ORAL | Status: DC
  Administered 2018-04-08 – 2018-04-11 (×6): 300 mg via ORAL
  Filled 2018-04-08 (×6): qty 1

## 2018-04-08 MED ORDER — DOCUSATE SODIUM 100 MG PO CAPS: 100.0000 mg | ORAL_CAPSULE | Freq: Two times a day (BID) | ORAL | 0 refills | Status: DC

## 2018-04-08 MED ORDER — ATORVASTATIN CALCIUM 20 MG PO TABS
20.0000 mg | ORAL_TABLET | Freq: Every evening | ORAL | Status: DC
  Administered 2018-04-08 – 2018-04-10 (×3): 20 mg via ORAL
  Filled 2018-04-08 (×3): qty 1

## 2018-04-08 MED ORDER — SODIUM CHLORIDE 0.9 % IJ SOLN
INTRAMUSCULAR | Status: AC
  Filled 2018-04-08: qty 50

## 2018-04-08 MED ORDER — SODIUM CHLORIDE 0.9 % IJ SOLN
INTRAMUSCULAR | Status: DC | PRN
  Administered 2018-04-08: 30 mL

## 2018-04-08 MED ORDER — DOCUSATE SODIUM 100 MG PO CAPS
100.0000 mg | ORAL_CAPSULE | Freq: Two times a day (BID) | ORAL | Status: DC
  Administered 2018-04-08 – 2018-04-11 (×6): 100 mg via ORAL
  Filled 2018-04-08 (×6): qty 1

## 2018-04-08 MED ORDER — TRANEXAMIC ACID 1000 MG/10ML IV SOLN
1000.0000 mg | INTRAVENOUS | Status: AC
  Administered 2018-04-08: 1000 mg via INTRAVENOUS
  Filled 2018-04-08: qty 10
  Filled 2018-04-08: qty 1000

## 2018-04-08 MED ORDER — GLYCOPYRROLATE 0.2 MG/ML IJ SOLN
INTRAMUSCULAR | Status: DC | PRN
  Administered 2018-04-08: 0.1 mg via INTRAVENOUS

## 2018-04-08 MED ORDER — METHOCARBAMOL 500 MG PO TABS
500.0000 mg | ORAL_TABLET | Freq: Four times a day (QID) | ORAL | Status: DC | PRN
  Administered 2018-04-10: 500 mg via ORAL
  Filled 2018-04-08: qty 1

## 2018-04-08 MED ORDER — ROPIVACAINE HCL 5 MG/ML IJ SOLN
INTRAMUSCULAR | Status: DC | PRN
  Administered 2018-04-08: 25 mL via PERINEURAL

## 2018-04-08 MED ORDER — SODIUM CHLORIDE 0.9 % IV SOLN
INTRAVENOUS | Status: DC
  Administered 2018-04-08 – 2018-04-09 (×2): via INTRAVENOUS

## 2018-04-08 MED ORDER — PROPOFOL 10 MG/ML IV BOLUS
INTRAVENOUS | Status: DC | PRN
  Administered 2018-04-08: 20 mg via INTRAVENOUS

## 2018-04-08 MED ORDER — MIDAZOLAM HCL 2 MG/2ML IJ SOLN
1.0000 mg | INTRAMUSCULAR | Status: DC
  Administered 2018-04-08: 1 mg via INTRAVENOUS
  Administered 2018-04-08: 2 mg via INTRAVENOUS

## 2018-04-08 MED ORDER — SODIUM CHLORIDE 0.9 % IR SOLN
Status: DC | PRN
  Administered 2018-04-08: 1000 mL

## 2018-04-08 MED ORDER — SODIUM CHLORIDE 0.9 % IV SOLN
INTRAVENOUS | Status: DC | PRN
  Administered 2018-04-08: 20 ug/min via INTRAVENOUS

## 2018-04-08 MED ORDER — MAGNESIUM CITRATE PO SOLN: 1.0000 | Freq: Once | ORAL | Status: DC | PRN

## 2018-04-08 MED ORDER — HYDROMORPHONE HCL 1 MG/ML IJ SOLN: 0.5000 mg | INTRAMUSCULAR | Status: DC | PRN

## 2018-04-08 SURGICAL SUPPLY — 60 items
APL SKNCLS STERI-STRIP NONHPOA (GAUZE/BANDAGES/DRESSINGS) ×1
ATTUNE MED DOME PAT 38 KNEE (Knees) ×1 IMPLANT
ATTUNE PS FEM RT SZ 4 CEM KNEE (Femur) ×1 IMPLANT
ATTUNE PSRP INSR SZ4 10 KNEE (Insert) ×1 IMPLANT
BAG SPEC THK2 15X12 ZIP CLS (MISCELLANEOUS) ×1
BAG ZIPLOCK 12X15 (MISCELLANEOUS) ×2 IMPLANT
BANDAGE ACE 6X5 VEL STRL LF (GAUZE/BANDAGES/DRESSINGS) ×2 IMPLANT
BASE TIBIAL ROT PLAT SZ 5 KNEE (Knees) IMPLANT
BENZOIN TINCTURE PRP APPL 2/3 (GAUZE/BANDAGES/DRESSINGS) ×2 IMPLANT
BLADE SAGITTAL 25.0X1.19X90 (BLADE) ×2 IMPLANT
BLADE SAW SGTL 11.0X1.19X90.0M (BLADE) ×2 IMPLANT
BOOTIES KNEE HIGH SLOAN (MISCELLANEOUS) ×2 IMPLANT
BOWL SMART MIX CTS (DISPOSABLE) ×2 IMPLANT
BSPLAT TIB 5 CMNT ROT PLAT STR (Knees) ×1 IMPLANT
CEMENT HV SMART SET (Cement) ×4 IMPLANT
CUFF TOURN SGL QUICK 34 (TOURNIQUET CUFF) ×2
CUFF TRNQT CYL 34X4X40X1 (TOURNIQUET CUFF) ×1 IMPLANT
DECANTER SPIKE VIAL GLASS SM (MISCELLANEOUS) IMPLANT
DRAPE U-SHAPE 47X51 STRL (DRAPES) ×2 IMPLANT
DRESSING AQUACEL AG SP 3.5X10 (GAUZE/BANDAGES/DRESSINGS) IMPLANT
DRSG AQUACEL AG ADV 3.5X10 (GAUZE/BANDAGES/DRESSINGS) ×2 IMPLANT
DRSG AQUACEL AG SP 3.5X10 (GAUZE/BANDAGES/DRESSINGS) ×2
DURAPREP 26ML APPLICATOR (WOUND CARE) ×2 IMPLANT
ELECT REM PT RETURN 15FT ADLT (MISCELLANEOUS) ×2 IMPLANT
GLOVE BIOGEL PI IND STRL 8 (GLOVE) ×2 IMPLANT
GLOVE BIOGEL PI INDICATOR 8 (GLOVE) ×2
GLOVE ECLIPSE 7.5 STRL STRAW (GLOVE) ×4 IMPLANT
GOWN STRL REUS W/TWL XL LVL3 (GOWN DISPOSABLE) ×4 IMPLANT
HANDPIECE INTERPULSE COAX TIP (DISPOSABLE) ×2
HOLDER FOLEY CATH W/STRAP (MISCELLANEOUS) IMPLANT
HOOD PEEL AWAY FLYTE STAYCOOL (MISCELLANEOUS) ×6 IMPLANT
IMMOBILIZER KNEE 20 (SOFTGOODS) ×3 IMPLANT
IMMOBILIZER KNEE 20 THIGH 36 (SOFTGOODS) ×1 IMPLANT
MANIFOLD NEPTUNE II (INSTRUMENTS) ×2 IMPLANT
NDL SAFETY ECLIPSE 18X1.5 (NEEDLE) IMPLANT
NEEDLE HYPO 18GX1.5 SHARP (NEEDLE)
NEEDLE HYPO 22GX1.5 SAFETY (NEEDLE) ×2 IMPLANT
NS IRRIG 1000ML POUR BTL (IV SOLUTION) ×2 IMPLANT
PACK ICE MAXI GEL EZY WRAP (MISCELLANEOUS) ×2 IMPLANT
PACK TOTAL KNEE CUSTOM (KITS) ×2 IMPLANT
PADDING CAST COTTON 6X4 STRL (CAST SUPPLIES) ×2 IMPLANT
PIN FIX SIGMA HP QUICK REL (PIN) ×1 IMPLANT
PIN THREADED HEADED SIGMA (PIN) ×1 IMPLANT
POSITIONER SURGICAL ARM (MISCELLANEOUS) ×2 IMPLANT
SET HNDPC FAN SPRY TIP SCT (DISPOSABLE) ×1 IMPLANT
STAPLER VISISTAT 35W (STAPLE) IMPLANT
STRIP CLOSURE SKIN 1/2X4 (GAUZE/BANDAGES/DRESSINGS) ×1 IMPLANT
SUT MNCRL AB 3-0 PS2 18 (SUTURE) ×2 IMPLANT
SUT VIC AB 0 CT1 36 (SUTURE) ×2 IMPLANT
SUT VIC AB 1 CT1 36 (SUTURE) ×4 IMPLANT
SUT VIC AB 2-0 CT1 27 (SUTURE) ×4
SUT VIC AB 2-0 CT1 TAPERPNT 27 (SUTURE) ×2 IMPLANT
SYR 3ML LL SCALE MARK (SYRINGE) IMPLANT
SYR CONTROL 10ML LL (SYRINGE) ×4 IMPLANT
TIBIAL BASE ROT PLAT SZ 5 KNEE (Knees) ×2 IMPLANT
TOWEL OR NON WOVEN STRL DISP B (DISPOSABLE) ×2 IMPLANT
TRAY FOLEY MTR SLVR 16FR STAT (SET/KITS/TRAYS/PACK) ×2 IMPLANT
WATER STERILE IRR 1000ML POUR (IV SOLUTION) ×4 IMPLANT
WRAP KNEE MAXI GEL POST OP (GAUZE/BANDAGES/DRESSINGS) ×1 IMPLANT
YANKAUER SUCT BULB TIP 10FT TU (MISCELLANEOUS) ×2 IMPLANT

## 2018-04-08 NOTE — Anesthesia Procedure Notes (Signed)
Anesthesia Regional Block: Adductor canal block   Pre-Anesthetic Checklist: ,, timeout performed, Correct Patient, Correct Site, Correct Laterality, Correct Procedure, Correct Position, site marked, Risks and benefits discussed,  Surgical consent,  Pre-op evaluation,  At surgeon's request and post-op pain management  Laterality: Right  Prep: chloraprep       Needles:  Injection technique: Single-shot  Needle Type: Echogenic Stimulator Needle     Needle Length: 9cm  Needle Gauge: 21     Additional Needles:   Procedures:,,,, ultrasound used (permanent image in chart),,,,  Narrative:  Start time: 04/08/2018 8:45 AM End time: 04/08/2018 8:54 AM Injection made incrementally with aspirations every 5 mL.  Performed by: Personally  Anesthesiologist: Lucretia Kern, MD  Additional Notes: Monitors applied. Injection made in 5cc increments. No resistance to injection. Good needle visualization. Patient tolerated procedure well.

## 2018-04-08 NOTE — Anesthesia Preprocedure Evaluation (Addendum)
Anesthesia Evaluation  Patient identified by MRN, date of birth, ID band Patient awake    Reviewed: Allergy & Precautions, NPO status , Patient's Chart, lab work & pertinent test results  History of Anesthesia Complications Negative for: history of anesthetic complications  Airway Mallampati: III  TM Distance: >3 FB Neck ROM: Full    Dental no notable dental hx.    Pulmonary neg pulmonary ROS,    Pulmonary exam normal        Cardiovascular hypertension, Normal cardiovascular exam     Neuro/Psych negative neurological ROS  negative psych ROS   GI/Hepatic negative GI ROS, Neg liver ROS,   Endo/Other  diabetes ("pre-diabetes" A1c 5.9)  Renal/GU negative Renal ROS  negative genitourinary   Musculoskeletal  (+) Arthritis , Osteoarthritis,    Abdominal   Peds  Hematology negative hematology ROS (+)   Anesthesia Other Findings   Reproductive/Obstetrics                            Anesthesia Physical Anesthesia Plan  ASA: II  Anesthesia Plan: Spinal   Post-op Pain Management:  Regional for Post-op pain   Induction:   PONV Risk Score and Plan: 1 and Propofol infusion  Airway Management Planned: Natural Airway, Nasal Cannula and Simple Face Mask  Additional Equipment: None  Intra-op Plan:   Post-operative Plan:   Informed Consent: I have reviewed the patients History and Physical, chart, labs and discussed the procedure including the risks, benefits and alternatives for the proposed anesthesia with the patient or authorized representative who has indicated his/her understanding and acceptance.     Plan Discussed with:   Anesthesia Plan Comments:        Anesthesia Quick Evaluation

## 2018-04-08 NOTE — Progress Notes (Signed)
Assisted Dr Whitman with right, ultrasound guided, adductor canal block. Side rails up, monitors on throughout procedure. See vital signs in flow sheet. Tolerated Procedure well.  

## 2018-04-08 NOTE — Brief Op Note (Signed)
04/08/2018  1:41 PM  PATIENT:  Carl Vang  57 y.o. male  PRE-OPERATIVE DIAGNOSIS:  OSTEOARTHRITIS RIGHT KNEE  POST-OPERATIVE DIAGNOSIS:  OSTEOARTHRITIS RIGHT KNEE  PROCEDURE:  Procedure(s): RIGHT TOTAL KNEE ARTHROPLASTY (Right)  SURGEON:  Surgeon(s) and Role:    Jodi Geralds, MD - Primary  PHYSICIAN ASSISTANT:   ASSISTANTS: bethune   ANESTHESIA:   spinal  EBL:  25 mL   BLOOD ADMINISTERED:none  DRAINS: none   LOCAL MEDICATIONS USED:  MARCAINE    and OTHER experel  SPECIMEN:  No Specimen  DISPOSITION OF SPECIMEN:  N/A  COUNTS:  YES  TOURNIQUET:   Total Tourniquet Time Documented: Thigh (Right) - 50 minutes Total: Thigh (Right) - 50 minutes   DICTATION: .Other Dictation: Dictation Number [id number]  PLAN OF CARE: Admit to inpatient   PATIENT DISPOSITION:  PACU - hemodynamically stable.   Delay start of Pharmacological VTE agent (>24hrs) due to surgical blood loss or risk of bleeding: no

## 2018-04-08 NOTE — Anesthesia Postprocedure Evaluation (Signed)
Anesthesia Post Note  Patient: Carl Vang  Procedure(s) Performed: RIGHT TOTAL KNEE ARTHROPLASTY (Right Knee)     Patient location during evaluation: PACU Anesthesia Type: Spinal Level of consciousness: oriented, awake and alert and awake Pain management: pain level controlled Vital Signs Assessment: post-procedure vital signs reviewed and stable Respiratory status: spontaneous breathing, respiratory function stable and nonlabored ventilation Cardiovascular status: blood pressure returned to baseline and stable Postop Assessment: no headache, no backache, no apparent nausea or vomiting and spinal receding Anesthetic complications: no    Last Vitals:  Vitals:   04/08/18 1430 04/08/18 1458  BP: 110/75 117/79  Pulse: (!) 51 60  Resp: (!) 9   Temp: (!) 36.4 C   SpO2: 100% 100%    Last Pain:  Vitals:   04/08/18 1430  TempSrc:   PainSc: Asleep                 Cecile Hearing

## 2018-04-08 NOTE — Progress Notes (Signed)
PT Cancellation Note  Patient Details Name: Carl Vang MRN: [id number] DOB: [birth year]   Cancelled Treatment:    Reason Eval/Treat Not Completed: Patient not medically ready - Pt's spinal anesthesia not worn off yet, per nursing pt with difficulty feeling feet and is numb S1 and below. PT to defer mobility until tomorrow due to this.   Nicola Police, PT Acute Rehabilitation Services Pager [phone]  Office [phone]    Tyrone Apple D Despina Hidden 04/08/2018, 7:59 PM

## 2018-04-08 NOTE — H&P (Signed)
TOTAL KNEE ADMISSION H&P  Patient is being admitted for right total knee arthroplasty.  Subjective:  Chief Complaint:right knee pain.  HPI: Carl Vang, 57 y.o. male, has a history of pain and functional disability in the right knee due to arthritis and has failed non-surgical conservative treatments for greater than 12 weeks to includeNSAID's and/or analgesics, corticosteriod injections, viscosupplementation injections, use of assistive devices and activity modification.  Onset of symptoms was gradual, starting 5 years ago with gradually worsening course since that time. The patient noted no past surgery on the right knee(s).  Patient currently rates pain in the right knee(s) at 8 out of 10 with activity. Patient has night pain, worsening of pain with activity and weight bearing, pain that interferes with activities of daily living, pain with passive range of motion, crepitus and joint swelling.  Patient has evidence of subchondral cysts, subchondral sclerosis, joint subluxation and joint space narrowing by imaging studies. This patient has had Failure of all reasonable conservative care. There is no active infection.  Patient Active Problem List   Diagnosis Date Noted  . Elevated hemoglobin A1c 01/28/2017  . Hypertension 04/08/2016  . Primary osteoarthritis of right knee 01/23/2015  . IFG (impaired fasting glucose) 04/09/2014  . Traumatic amputation of left leg below knee, sequela ([place]) 06/16/2013   Past Medical History:  Diagnosis Date  . Hyperlipidemia   . Hypertension   . Osteoarthritis of knee   . Prediabetes     Past Surgical History:  Procedure Laterality Date  . COLONOSCOPY WITH PROPOFOL N/A 08/06/2014   Procedure: COLONOSCOPY WITH PROPOFOL;  Surgeon: Garlan Fair, MD;  Location: [facility];  Service: Endoscopy;  Laterality: N/A;  . left leg below knee amputation   2003   in accident at work   . MOUTH SURGERY      No current facility-administered medications for this  encounter.    Current Outpatient Medications  Medication Sig Dispense Refill Last Dose  . amLODipine (NORVASC) 5 MG tablet Take 1 tablet (5 mg total) by mouth daily. (Patient taking differently: Take 5 mg by mouth every evening. ) 90 tablet 3   . atorvastatin (LIPITOR) 20 MG tablet Take 1 tablet (20 mg total) by mouth daily. (Patient taking differently: Take 20 mg by mouth every evening. ) 90 tablet 3   . hydrochlorothiazide (HYDRODIURIL) 12.5 MG tablet Take 1 tablet (12.5 mg total) by mouth daily. (Patient taking differently: Take 12.5 mg by mouth every evening. ) 90 tablet 3   . meloxicam (MOBIC) 15 MG tablet Take 1 tablet (15 mg total) by mouth daily. (Patient taking differently: Take 15 mg by mouth every evening. ) 90 tablet 0    No Known Allergies  Social History   Tobacco Use  . Smoking status: Never Smoker  . Smokeless tobacco: Never Used  Substance Use Topics  . Alcohol use: Not Currently    Family History  Problem Relation Age of Onset  . Diabetes Mother   . Hypertension Mother   . Cancer Mother   . Stroke Mother   . Diabetes Father   . Hypertension Father   . Diabetes Daughter   . Hypertension Daughter   . Heart disease Daughter      ROS ROS: I have reviewed the patient's review of systems thoroughly and there are no positive responses as relates to the HPI. Objective:  Physical Exam  Vital signs in last 24 hours:   Well-developed well-nourished patient in no acute distress. Alert and oriented x3  HEENT:within normal limits Cardiac: Regular rate and rhythm Pulmonary: Lungs clear to auscultation Abdomen: Soft and nontender.  Normal active bowel sounds  Musculoskeletal: Right knee: Painful range of motion.  Limited range of motion.  No instability.  Trace effusion.) Labs: Recent Results (from the past 2160 hour(s))  ABO/Rh     Status: None   Collection Time: 04/04/18  2:46 PM  Result Value Ref Range   ABO/RH(D)      A POS Performed at [facility], [place] [address] [place], [place] [address]   Surgical pcr screen     Status: None   Collection Time: 04/04/18  2:50 PM  Result Value Ref Range   MRSA, PCR NEGATIVE NEGATIVE   Staphylococcus aureus NEGATIVE NEGATIVE    Comment: (NOTE) The Xpert SA Assay (FDA approved for NASAL specimens in patients 23 years of age and older), is one component of a comprehensive surveillance program. It is not intended to diagnose infection nor to guide or monitor treatment. Performed at [facility], [place] [address], [place], [place] [address]   APTT     Status: None   Collection Time: 04/04/18  2:50 PM  Result Value Ref Range   aPTT 31 24 - 36 seconds    Comment: Performed at [facility], [place] [address] [place], [place] [address]  CBC WITH DIFFERENTIAL     Status: Abnormal   Collection Time: 04/04/18  2:50 PM  Result Value Ref Range   WBC 8.3 4.0 - 10.5 K/uL   RBC 4.34 4.22 - 5.81 MIL/uL   Hemoglobin 12.5 (L) 13.0 - 17.0 g/dL   HCT 37.2 (L) 39.0 - 52.0 %   MCV 85.7 78.0 - 100.0 fL   MCH 28.8 26.0 - 34.0 pg   MCHC 33.6 30.0 - 36.0 g/dL   RDW 13.2 11.5 - 15.5 %   Platelets 268 150 - 400 K/uL   Neutrophils Relative % 44 %   Neutro Abs 3.6 1.7 - 7.7 K/uL   Lymphocytes Relative 45 %   Lymphs Abs 3.7 0.7 - 4.0 K/uL   Monocytes Relative 5 %   Monocytes Absolute 0.4 0.1 - 1.0 K/uL   Eosinophils Relative 6 %   Eosinophils Absolute 0.5 0.0 - 0.7 K/uL   Basophils Relative 0 %   Basophils Absolute 0.0 0.0 - 0.1 K/uL    Comment: Performed at [facility], [place] [address], [place], [place] [address]  Comprehensive metabolic panel     Status: None   Collection Time: 04/04/18  2:50 PM  Result Value Ref Range   Sodium 142 135 - 145 mmol/L   Potassium 3.7 3.5 - 5.1 mmol/L   Chloride 105 98 - 111 mmol/L   CO2 30 22 - 32 mmol/L   Glucose, Bld 98 70 - 99 mg/dL   BUN 12 6 - 20 mg/dL   Creatinine, Ser 0.95 0.61 - 1.24 mg/dL   Calcium  9.5 8.9 - 10.3 mg/dL   Total Protein 7.0 6.5 - 8.1 g/dL   Albumin 4.3 3.5 - 5.0 g/dL   AST 21 15 - 41 U/L   ALT 16 0 - 44 U/L   Alkaline Phosphatase 59 38 - 126 U/L   Total Bilirubin 0.9 0.3 - 1.2 mg/dL   GFR calc non Af Amer >60 >60 mL/min   GFR calc Af Amer >60 >60 mL/min    Comment: (NOTE) The eGFR has been calculated using the CKD EPI equation. This calculation  has not been validated in all clinical situations. eGFR's persistently <60 mL/min signify possible Chronic Kidney Disease.    Anion gap 7 5 - 15    Comment: Performed at [facility], [place] [address] [place], [place] [address]  Protime-INR     Status: None   Collection Time: 04/04/18  2:50 PM  Result Value Ref Range   Prothrombin Time 13.4 11.4 - 15.2 seconds   INR 1.03     Comment: Performed at [facility], [place] [address], [place], [place] [address]  Type and screen Order type and screen if day of surgery is less than 15 days from draw of preadmission visit or order morning of surgery if day of surgery is greater than 6 days from preadmission visit.     Status: None   Collection Time: 04/04/18  2:50 PM  Result Value Ref Range   ABO/RH(D) A POS    Antibody Screen NEG    Sample Expiration 04/11/2018    Extend sample reason      NO TRANSFUSIONS OR PREGNANCY IN THE PAST 3 MONTHS Performed at [facility], [place] [address], [place], [place] [address]   Urinalysis, Routine w reflex microscopic     Status: Abnormal   Collection Time: 04/04/18  2:50 PM  Result Value Ref Range   Color, Urine YELLOW YELLOW   APPearance CLEAR CLEAR   Specific Gravity, Urine 1.018 1.005 - 1.030   pH 5.0 5.0 - 8.0   Glucose, UA NEGATIVE NEGATIVE mg/dL   Hgb urine dipstick NEGATIVE NEGATIVE   Bilirubin Urine NEGATIVE NEGATIVE   Ketones, ur NEGATIVE NEGATIVE mg/dL   Protein, ur NEGATIVE NEGATIVE mg/dL   Nitrite NEGATIVE NEGATIVE   Leukocytes, UA LARGE (A) NEGATIVE   RBC / HPF 6-10 0 -  5 RBC/hpf   WBC, UA 21-50 0 - 5 WBC/hpf   Bacteria, UA NONE SEEN NONE SEEN   Squamous Epithelial / LPF 0-5 0 - 5   Mucus PRESENT     Comment: Performed at [facility], [place] [address] [place], [place] [address]  Hemoglobin A1c     Status: Abnormal   Collection Time: 04/04/18  2:50 PM  Result Value Ref Range   Hgb A1c MFr Bld 5.9 (H) 4.8 - 5.6 %    Comment: (NOTE)         Prediabetes: 5.7 - 6.4         Diabetes: >6.4         Glycemic control for adults with diabetes: <7.0    Mean Plasma Glucose 123 mg/dL    Comment: (NOTE) Performed At: [facility] [place], [place] [id number] Rush Farmer MD CM:[id number]     Estimated body mass index is 27.46 kg/m as calculated from the following:   Height as of 04/04/18: 5' 5"  (1.651 m).   Weight as of 04/04/18: 74.8 kg.   Imaging Review Plain radiographs demonstrate severe degenerative joint disease of the right knee(s). The overall alignment ismild varus. The bone quality appears to be fair for age and reported activity level.   Preoperative templating of the joint replacement has been completed, documented, and submitted to the Operating Room personnel in order to optimize intra-operative equipment management.   Anticipated LOS equal to or greater than 2 midnights due to - Age 60 and older with one or more of the following:  - Obesity  - Expected need for hospital services (PT, OT, Nursing) required for safe  discharge  - Anticipated need for postoperative skilled nursing care or inpatient rehab  - Active co-morbidities: Below-knee amputation on the left side and will need additional physical therapy for stability OR   - Unanticipated findings during/Post Surgery: Below-knee amputation on the left side which I think will pose significant postoperative mobilization challenges  - Patient is a high risk of re-admission due to: Below-knee amputation on the left side which will pose significant  mobilization challenges     Assessment/Plan:  End stage arthritis, right knee   The patient history, physical examination, clinical judgment of the provider and imaging studies are consistent with end stage degenerative joint disease of the right knee(s) and total knee arthroplasty is deemed medically necessary. The treatment options including medical management, injection therapy arthroscopy and arthroplasty were discussed at length. The risks and benefits of total knee arthroplasty were presented and reviewed. The risks due to aseptic loosening, infection, stiffness, patella tracking problems, thromboembolic complications and other imponderables were discussed. The patient acknowledged the explanation, agreed to proceed with the plan and consent was signed. Patient is being admitted for inpatient treatment for surgery, pain control, PT, OT, prophylactic antibiotics, VTE prophylaxis, progressive ambulation and ADL's and discharge planning. The patient is planning to be discharged home with home health services

## 2018-04-08 NOTE — Transfer of Care (Signed)
Immediate Anesthesia Transfer of Care Note  Patient: Carl Vang  Procedure(s) Performed: RIGHT TOTAL KNEE ARTHROPLASTY (Right Knee)  Patient Location: PACU  Anesthesia Type:MAC and Spinal  Level of Consciousness: alert , oriented, drowsy and patient cooperative  Airway & Oxygen Therapy: Patient Spontanous Breathing and Patient connected to nasal cannula oxygen  Post-op Assessment: Report given to RN and Post -op Vital signs reviewed and stable  Post vital signs: Reviewed and stable  Last Vitals:  Vitals Value Taken Time  BP 109/75 04/08/2018  1:20 PM  Temp    Pulse 64 04/08/2018  1:26 PM  Resp 16 04/08/2018  1:26 PM  SpO2 100 % 04/08/2018  1:26 PM  Vitals shown include unvalidated device data.  Last Pain:  Vitals:   04/08/18 0826  TempSrc: Oral      Patients Stated Pain Goal: 4 (42/70/62 3762)  Complications: No apparent anesthesia complications

## 2018-04-08 NOTE — Op Note (Signed)
NAME: Carl Vang, Carl W. MEDICAL RECORD [id number] ACCOUNT [id number] DATE OF BIRTH:1960-07-14 FACILITY[facility] LOCATION: [facility] PHYSICIAN:Ayeisha Lindenberger L. Janziel Hockett, MD  OPERATIVE REPORT  DATE OF PROCEDURE:  04/08/2018  PREOPERATIVE DIAGNOSIS:  End-stage degenerative joint disease, right knee, with severe bone-on-bone change in the medial and patellofemoral.  POSTOPERATIVE DIAGNOSIS:  End-stage degenerative joint disease, right knee with severe bone-on-bone change in the medial and patellofemoral.  PROCEDURE:  Right total knee replacement with an Attune system, size 4 femur, size 5 tibia, 10 mm bridging bearing, and a 38 mm all polyethylene patella.  SURGEON:  Jodi Geralds, MD  ASSISTANT:  Gus Puma PA-C.  ANESTHESIA:  Spinal.  BRIEF HISTORY:  The patient is a 57 year old male with a long history of significant complaints of right knee pain.  He had been treated conservatively for a prolonged period of time.  After failure of all conservative care, he is taken to the operating  room for right total knee replacement.  He has a below-knee amputation on the other side and we had a long discussion with him about postoperative management, but felt that he would be able to manage postoperatively and he came to the operating room for  this procedure after failure of conservative care including injection therapy, activity modification, viscosupplementation.  He is having night pain and light activity pain prior to the surgery.  DESCRIPTION OF PROCEDURE:  The patient was taken to the operating room after adequate anesthesia was obtained with a spinal anesthetic, the patient was taken to the operating table, right leg was prepped and draped in sterile fashion.  Following this,  the leg was exsanguinated, blood pressure tourniquet inflated to 300 mmHg.  Following this, a midline incision was made, subcutaneous tissues down to the extensor mechanism.  Medial parapatellar arthrotomy was undertaken.  Medial and  lateral meniscus  were removed, retropatellar fat pad, synovium on the anterior aspect of the femur, and anterior and posterior cruciates.  Following this, an intramedullary pilot hole was drilled in the femur and a 4 degree valgus inclination cut is made.  The femur was  measured to a 4.  Anterior and posterior cuts were made chamfers and box.  Attention turned to the tibia.  Tibia was cut perpendicular to its long axis sized to a 5.  It is drilled and keeled.  A trial 8 spacer was put in place.  Attention was turned to  the patella.  It was cut down to the level of 13 mm and a 38 paddle was chosen and lugs were drilled.  The lugs were drilled for the femur and the knee was put through a range of motion.  Excellent stability was achieved.  I thought it was maybe a little  bit loose in flexion and we just need to gain some extension to get to a 10 spacer.  He was brought out and the trial components were removed.  We did some posterior osteophyte removal and then a trial with a 10 spacer and that seemed to work good.  At  this point, the knee was copiously irrigated and suctioned dried.  The final components were then cemented into place, size 5 tibia, size 4 femur, a 10 mm bridging bearing trial was placed and a 38 all poly patella was placed and held with a clamp.  Once  all excess bone cement had been removed and the cement was completely hardened, the tourniquet was let down, all bleeders controlled with electrocautery.  The trial 10 was then evaluated with the  tourniquet down and felt to be good in flexion and  extension.  The final 10 was opened and placed.  Medial parapatellar arthrotomy was closed with #1 Vicryl running, skin with 2-0 Vicryl and 3-0 Monocryl subcuticular.  Benzoin and Steri-Strips were applied.  Sterile compressive dressing applied.  The  patient was taken to recovery was noted to be in satisfactory condition.  Estimated blood loss for procedure was minimal.  TN/NUANCE   D:04/08/2018 T:04/08/2018 JOB:[id number]

## 2018-04-08 NOTE — Discharge Instructions (Signed)

## 2018-04-09 LAB — BASIC METABOLIC PANEL
Anion gap: 7 (ref 5–15)
BUN: 14 mg/dL (ref 6–20)
CO2: 29 mmol/L (ref 22–32)
Calcium: 9.2 mg/dL (ref 8.9–10.3)
Chloride: 101 mmol/L (ref 98–111)
Creatinine, Ser: 1.05 mg/dL (ref 0.61–1.24)
GFR calc Af Amer: >60 mL/min (ref >60)
GFR calc non Af Amer: >60 mL/min (ref >60)
Glucose, Bld: 182 mg/dL — ABNORMAL HIGH (ref 70–99)
Potassium: 3.9 mmol/L (ref 3.5–5.1)
Sodium: 137 mmol/L (ref 135–145)

## 2018-04-09 LAB — CBC
HCT: 37.3 % — ABNORMAL LOW (ref 39.0–52.0)
Hemoglobin: 12.7 g/dL — ABNORMAL LOW (ref 13.0–17.0)
MCH: 28.5 pg (ref 26.0–34.0)
MCHC: 34 g/dL (ref 30.0–36.0)
MCV: 83.8 fL (ref 78.0–100.0)
Platelets: 293 10*3/uL (ref 150–400)
RBC: 4.45 MIL/uL (ref 4.22–5.81)
RDW: 12.9 % (ref 11.5–15.5)
WBC: 16.6 10*3/uL — ABNORMAL HIGH (ref 4.0–10.5)

## 2018-04-09 NOTE — Evaluation (Signed)
Physical Therapy Evaluation Patient Details Name: Carl Vang MRN: [id number] DOB: Apr 22, 1961 Today's Date: 04/09/2018   History of Present Illness  Pt is a 57 year old male s/p R TKA with hx of L BKA  Clinical Impression  Pt is s/p TKA resulting in the deficits listed below (see PT Problem List).  Pt will benefit from skilled PT to increase their independence and safety with mobility to allow discharge to the venue listed below.  Pt very eager and motivated to participate.  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  Pt plans to d/c home tomorrow however needs to be able to perform one flight of stairs.     Follow Up Recommendations Follow surgeon's recommendation for DC plan and follow-up therapies    Equipment Recommendations  Rolling walker with 5" wheels    Recommendations for Other Services       Precautions / Restrictions Precautions Precautions: Fall;Knee Required Braces or Orthoses: Knee Immobilizer - Right Knee Immobilizer - Right: On when out of bed or walking Restrictions Weight Bearing Restrictions: No Other Position/Activity Restrictions: WBAT      Mobility  Bed Mobility Overal bed mobility: Needs Assistance Bed Mobility: Supine to Sit     Supine to sit: HOB elevated;Min guard     General bed mobility comments: min/guard for safety  Transfers Overall transfer level: Needs assistance Equipment used: Rolling walker (2 wheeled) Transfers: Sit to/from Stand Sit to Stand: From elevated surface;Min guard         General transfer comment: pt donned prosthesis at EOB, verbal cues for UE and LE positioning  Ambulation/Gait Ambulation/Gait assistance: Min guard Gait Distance (Feet): 120 Feet Assistive device: Rolling walker (2 wheeled) Gait Pattern/deviations: Step-through pattern;Decreased stance time - right;Antalgic     General Gait Details: verbal cues for sequence, RW positioning, step length, distance to  tolerance  Stairs            Wheelchair Mobility    Modified Rankin (Stroke Patients Only)       Balance                                             Pertinent Vitals/Pain Pain Assessment: 0-10 Pain Score: 3  Pain Location: right knee Pain Descriptors / Indicators: Sore Pain Intervention(s): Limited activity within patient's tolerance;Repositioned;Monitored during session    Home Living Family/patient expects to be discharged to:: Private residence Living Arrangements: Children Available Help at Discharge: Family Type of Home: House Home Access: Stairs to enter Entrance Stairs-Rails: Right Entrance Stairs-Number of Steps: flight   Home Equipment: Gilmer Mor - single point;Bedside commode      Prior Function Level of Independence: Independent with assistive device(s)         Comments: uses L LE prosthesis for BKA     Hand Dominance        Extremity/Trunk Assessment        Lower Extremity Assessment Lower Extremity Assessment: LLE deficits/detail;RLE deficits/detail RLE Deficits / Details: R knee AAROM -5-85*; able to perform SLR, ankle pump LLE Deficits / Details: hx BKA, pt able to don prosthesis       Communication   Communication: No difficulties  Cognition Arousal/Alertness: Awake/alert Behavior During Therapy: WFL for tasks assessed/performed Overall Cognitive Status: Within Functional Limits for tasks assessed  General Comments      Exercises Total Joint Exercises Ankle Circles/Pumps: AROM;10 reps;Right Quad Sets: AROM;10 reps;Right Towel Squeeze: AROM;10 reps;Both Short Arc Quad: AAROM;10 reps;Right Heel Slides: AAROM;10 reps;Seated;Right Hip ABduction/ADduction: AROM;10 reps;Right Straight Leg Raises: Right;AROM;10 reps   Assessment/Plan    PT Assessment Patient needs continued PT services  PT Problem List Decreased mobility;Decreased strength;Decreased  range of motion;Decreased knowledge of use of DME;Pain;Decreased knowledge of precautions       PT Treatment Interventions Stair training;Gait training;Therapeutic exercise;DME instruction;Patient/family education;Therapeutic activities;Functional mobility training    PT Goals (Current goals can be found in the Care Plan section)  Acute Rehab PT Goals PT Goal Formulation: With patient Time For Goal Achievement: 04/13/18 Potential to Achieve Goals: Good    Frequency 7X/week   Barriers to discharge        Co-evaluation               AM-PAC PT "6 Clicks" Daily Activity  Outcome Measure Difficulty turning over in bed (including adjusting bedclothes, sheets and blankets)?: None Difficulty moving from lying on back to sitting on the side of the bed? : A Little Difficulty sitting down on and standing up from a chair with arms (e.g., wheelchair, bedside commode, etc,.)?: Unable Help needed moving to and from a bed to chair (including a wheelchair)?: A Little Help needed walking in hospital room?: A Little Help needed climbing 3-5 steps with a railing? : A Lot 6 Click Score: 16    End of Session Equipment Utilized During Treatment: Gait belt;Right knee immobilizer Activity Tolerance: Patient tolerated treatment well Patient left: in chair;with call bell/phone within reach Nurse Communication: Mobility status PT Visit Diagnosis: Other abnormalities of gait and mobility (R26.89)    Time: 0935-1000 PT Time Calculation (min) (ACUTE ONLY): 25 min   Charges:   PT Evaluation $PT Eval Low Complexity: 1 Low PT Treatments $Therapeutic Exercise: 8-22 mins       Zenovia Jarred, PT, DPT Acute Rehabilitation Services Office: [phone] Pager: [phone]  Sarajane Jews 04/09/2018, 12:15 PM

## 2018-04-09 NOTE — Plan of Care (Signed)
  Problem: Education: Goal: Knowledge of General Education information will improve Description Including pain rating scale, medication(s)/side effects and non-pharmacologic comfort measures Outcome: Progressing   Problem: Health Behavior/Discharge Planning: Goal: Ability to manage health-related needs will improve Outcome: Progressing   Problem: Clinical Measurements: Goal: Will remain free from infection Outcome: Progressing Goal: Respiratory complications will improve Outcome: Progressing Goal: Cardiovascular complication will be avoided Outcome: Progressing   Problem: Activity: Goal: Risk for activity intolerance will decrease Outcome: Progressing   Problem: Elimination: Goal: Will not experience complications related to urinary retention Outcome: Progressing   Problem: Pain Managment: Goal: General experience of comfort will improve Outcome: Progressing   Problem: Safety: Goal: Ability to remain free from injury will improve Outcome: Progressing   Problem: Education: Goal: Knowledge of the prescribed therapeutic regimen will improve Outcome: Progressing   Problem: Activity: Goal: Range of joint motion will improve Outcome: Progressing   Problem: Clinical Measurements: Goal: Postoperative complications will be avoided or minimized Outcome: Progressing   Problem: Pain Management: Goal: Pain level will decrease with appropriate interventions Outcome: Progressing   Reeves Forth, RN 04/09/18 1:30 AM

## 2018-04-09 NOTE — Progress Notes (Signed)
Patient assisted to dangle, tolerated well. Patient requested to stand. Rn assisted patient in putting his prosthetic Leg on prior to standing, walker used as well. Patient tolerated well, no dizziness.  Reeves Forth, RN 04/09/2018 12:00am

## 2018-04-09 NOTE — Care Management Note (Signed)
Case Management Note  Patient Details  Name: Carl Vang MRN: [id number] Date of Birth: [birth year]  Subjective/Objective:    Right TKA                Action/Plan: NCM spoke to pt and offered choice for [facility] Pt agreeable to [facility] for HHPT. Will need RW and 3n1 for home. Contacted [facility] for DME for home. Will deliver to room prior to dc.   Expected Discharge Date:                Expected Discharge Plan:  Home w Home Health Services  In-House Referral:  NA  Discharge planning Services  CM Consult  Post Acute Care Choice:  Home Health Choice offered to:  Patient  DME Arranged:  3-N-1, Walker rolling DME Agency:  [facility]  HH Arranged:  PT HH Agency:  [facility]  Status of Service:  Completed, signed off  If discussed at [employer], dates discussed:    Additional Comments:  Elliot Cousin, RN 04/09/2018, 10:11 AM

## 2018-04-09 NOTE — Progress Notes (Signed)
Physical Therapy Treatment Patient Details Name: Carl Vang MRN: [id number] DOB: 06-Sep-1960 Today's Date: 04/09/2018    History of Present Illness Pt is a 57 year old male s/p R TKA with hx of L BKA    PT Comments    Pt ambulated in hallway again and assisted back to bed in preparation for CPM.  Pt reports plan for d/c home on Monday.  Follow Up Recommendations  Follow surgeon's recommendation for DC plan and follow-up therapies     Equipment Recommendations  Rolling walker with 5" wheels    Recommendations for Other Services       Precautions / Restrictions Precautions Precautions: Fall;Knee Precaution Comments: pt performed SLR Required Braces or Orthoses: Knee Immobilizer - Right Restrictions Other Position/Activity Restrictions: WBAT    Mobility  Bed Mobility Overal bed mobility: Needs Assistance Bed Mobility: Sit to Supine       Sit to supine: Supervision   General bed mobility comments: min/guard for safety  Transfers Overall transfer level: Needs assistance Equipment used: Rolling walker (2 wheeled) Transfers: Sit to/from Stand Sit to Stand: Min guard         General transfer comment: verbal cues for UE and LE positioning  Ambulation/Gait Ambulation/Gait assistance: Min guard Gait Distance (Feet): 160 Feet Assistive device: Rolling walker (2 wheeled) Gait Pattern/deviations: Step-through pattern;Decreased stance time - right;Antalgic     General Gait Details: verbal cues for posture, RW positioning, step length, distance to tolerance   Stairs             Wheelchair Mobility    Modified Rankin (Stroke Patients Only)       Balance                                            Cognition Arousal/Alertness: Awake/alert Behavior During Therapy: WFL for tasks assessed/performed Overall Cognitive Status: Within Functional Limits for tasks assessed                                        Exercises       General Comments        Pertinent Vitals/Pain Pain Assessment: 0-10 Pain Score: 4  Pain Location: right knee Pain Descriptors / Indicators: Sore Pain Intervention(s): Repositioned;Monitored during session;Limited activity within patient's tolerance    Home Living                      Prior Function            PT Goals (current goals can now be found in the care plan section) Progress towards PT goals: Progressing toward goals    Frequency    7X/week      PT Plan Current plan remains appropriate    Co-evaluation              AM-PAC PT "6 Clicks" Daily Activity  Outcome Measure  Difficulty turning over in bed (including adjusting bedclothes, sheets and blankets)?: None Difficulty moving from lying on back to sitting on the side of the bed? : A Little Difficulty sitting down on and standing up from a chair with arms (e.g., wheelchair, bedside commode, etc,.)?: A Little Help needed moving to and from a bed to chair (including a wheelchair)?: A Little Help needed walking in hospital  room?: A Little Help needed climbing 3-5 steps with a railing? : A Lot 6 Click Score: 18    End of Session Equipment Utilized During Treatment: Gait belt Activity Tolerance: Patient tolerated treatment well Patient left: with call bell/phone within reach;in bed;with bed alarm set Nurse Communication: Mobility status PT Visit Diagnosis: Other abnormalities of gait and mobility (R26.89)     Time: 6213-0865 PT Time Calculation (min) (ACUTE ONLY): 12 min  Charges:  $Gait Training: 8-22 mins                     Zenovia Jarred, PT, DPT Acute Rehabilitation Services Office: [phone] Pager: [phone]   Carl Vang 04/09/2018, 4:17 PM

## 2018-04-09 NOTE — Progress Notes (Addendum)
    Patient doing well PO day 1 S/P R TKA per Dr Luiz Blare team. Pt has BKA on contralateral side. He reports pain is well controlled. He is eager to get up with PT OT, he is eating and drinking NL with NL B/B function. Denies N/V/D/SOB. Pending PT   Physical Exam: Vitals:   04/09/18 0925 04/09/18 1355  BP: 124/77 129/74  Pulse: 75 72  Resp: 16 18  Temp: 98.1 F (36.7 C) 98.4 F (36.9 C)  SpO2: 98% 97%   CBC Latest Ref Rng & Units 04/09/2018 04/04/2018 01/04/2018  WBC 4.0 - 10.5 K/uL 16.6(H) 8.3 6.8  Hemoglobin 13.0 - 17.0 g/dL 12.7(L) 12.5(L) 12.6(L)  Hematocrit 39.0 - 52.0 % 37.3(L) 37.2(L) 37.6  Platelets 150 - 400 K/uL 293 268 257    Dressing in place, CDI, distal compartments soft, 2+ DPP's = BIL, SCD at bedside. NVI  POD #1 s/p R TKA per Dr Luiz Blare team. Pt has BKA on contralateral side.  - up with PT/OT, encourage mobilization - Continue current pain regimen - ASA 325 BID for anticoagulation  - likely d/c home Monday with f/u in 2 weeks

## 2018-04-10 LAB — CBC
HCT: 34.5 % — ABNORMAL LOW (ref 39.0–52.0)
Hemoglobin: 11.8 g/dL — ABNORMAL LOW (ref 13.0–17.0)
MCH: 28.9 pg (ref 26.0–34.0)
MCHC: 34.2 g/dL (ref 30.0–36.0)
MCV: 84.4 fL (ref 78.0–100.0)
Platelets: 284 10*3/uL (ref 150–400)
RBC: 4.09 MIL/uL — ABNORMAL LOW (ref 4.22–5.81)
RDW: 13.1 % (ref 11.5–15.5)
WBC: 20.2 10*3/uL — ABNORMAL HIGH (ref 4.0–10.5)

## 2018-04-10 NOTE — Progress Notes (Signed)
Physical Therapy Treatment Patient Details Name: Carl Vang MRN: [id number] DOB: 24-Aug-1960 Today's Date: 04/10/2018    History of Present Illness Pt is a 57 year old male s/p R TKA with hx of L BKA    PT Comments    Pt is progressing well with mobility, he ambulated 300' with RW, performed TKA exercises, and initiated stair training with 1 handrail and a crutch. Min assist for balance with stairs. Will plan to review stair training with pt's son later today or tomorrow.   Follow Up Recommendations  Follow surgeon's recommendation for DC plan and follow-up therapies     Equipment Recommendations  Rolling walker with 5" wheels    Recommendations for Other Services       Precautions / Restrictions Precautions Precautions: Fall;Knee Precaution Comments: pt performed SLR Required Braces or Orthoses: Knee Immobilizer - Right(used for stair training) Restrictions Weight Bearing Restrictions: No Other Position/Activity Restrictions: WBAT    Mobility  Bed Mobility Overal bed mobility: Modified Independent Bed Mobility: Supine to Sit     Supine to sit: Modified independent (Device/Increase time)     General bed mobility comments: used bedrail; donned LLE prosthesis independently  Transfers Overall transfer level: Needs assistance Equipment used: Rolling walker (2 wheeled) Transfers: Sit to/from Stand Sit to Stand: Min guard         General transfer comment: verbal cues for UE and LE positioning  Ambulation/Gait Ambulation/Gait assistance: Min guard Gait Distance (Feet): 300 Feet Assistive device: Rolling walker (2 wheeled) Gait Pattern/deviations: Step-through pattern;Decreased stance time - right     General Gait Details: steady with RW, no loss of balance   Stairs Stairs: Yes Stairs assistance: Min assist Stair Management: One rail Right;Forwards;With crutches Number of Stairs: 9 General stair comments: min assist for balance, VCs for sequencing with  crutch, wore R KI for extra support   Wheelchair Mobility    Modified Rankin (Stroke Patients Only)       Balance                                            Cognition Arousal/Alertness: Awake/alert Behavior During Therapy: WFL for tasks assessed/performed Overall Cognitive Status: Within Functional Limits for tasks assessed                                        Exercises Total Joint Exercises Ankle Circles/Pumps: AROM;10 reps;Right Quad Sets: AROM;10 reps;Right Short Arc Quad: AAROM;10 reps;Right Heel Slides: AAROM;10 reps;Right;Supine Straight Leg Raises: Right;AROM;Supine;5 reps Long Arc Quad: AROM;Right;10 reps;Seated Knee Flexion: AROM;AAROM;Right;5 reps;Seated Goniometric ROM: 0-65* AAROM R knee    General Comments        Pertinent Vitals/Pain Pain Score: 2  Pain Location: right knee with walking Pain Descriptors / Indicators: Sore Pain Intervention(s): Limited activity within patient's tolerance;Monitored during session;Ice applied(pt declined pain medication)    Home Living                      Prior Function            PT Goals (current goals can now be found in the care plan section) Acute Rehab PT Goals PT Goal Formulation: With patient Time For Goal Achievement: 04/13/18 Potential to Achieve Goals: Good Progress towards PT goals: Progressing toward goals  Frequency    7X/week      PT Plan Current plan remains appropriate    Co-evaluation              AM-PAC PT "6 Clicks" Daily Activity  Outcome Measure  Difficulty turning over in bed (including adjusting bedclothes, sheets and blankets)?: None Difficulty moving from lying on back to sitting on the side of the bed? : None Difficulty sitting down on and standing up from a chair with arms (e.g., wheelchair, bedside commode, etc,.)?: A Little Help needed moving to and from a bed to chair (including a wheelchair)?: A Little Help needed  walking in hospital room?: A Little Help needed climbing 3-5 steps with a railing? : A Little 6 Click Score: 20    End of Session Equipment Utilized During Treatment: Gait belt Activity Tolerance: Patient tolerated treatment well Patient left: with call bell/phone within reach;in chair Nurse Communication: Mobility status PT Visit Diagnosis: Other abnormalities of gait and mobility (R26.89)     Time: 3086-5784 PT Time Calculation (min) (ACUTE ONLY): 30 min  Charges:  $Gait Training: 8-22 mins $Therapeutic Exercise: 8-22 mins                     Ralene Bathe Kistler PT 04/10/2018  Acute Rehabilitation Services Pager [phone] Office [phone]

## 2018-04-10 NOTE — Plan of Care (Signed)
  Problem: Education: Goal: Knowledge of General Education information will improve Description Including pain rating scale, medication(s)/side effects and non-pharmacologic comfort measures Outcome: Progressing   Problem: Health Behavior/Discharge Planning: Goal: Ability to manage health-related needs will improve Outcome: Progressing   Problem: Clinical Measurements: Goal: Will remain free from infection Outcome: Progressing Goal: Respiratory complications will improve Outcome: Progressing Goal: Cardiovascular complication will be avoided Outcome: Progressing   Problem: Activity: Goal: Risk for activity intolerance will decrease Outcome: Progressing   Problem: Elimination: Goal: Will not experience complications related to bowel motility Outcome: Progressing Goal: Will not experience complications related to urinary retention Outcome: Progressing   Problem: Pain Managment: Goal: General experience of comfort will improve Outcome: Progressing   Problem: Safety: Goal: Ability to remain free from injury will improve Outcome: Progressing   Problem: Skin Integrity: Goal: Risk for impaired skin integrity will decrease Outcome: Progressing   Problem: Education: Goal: Knowledge of the prescribed therapeutic regimen will improve Outcome: Progressing   Problem: Activity: Goal: Ability to avoid complications of mobility impairment will improve Outcome: Progressing Goal: Range of joint motion will improve Outcome: Progressing   Problem: Clinical Measurements: Goal: Postoperative complications will be avoided or minimized Outcome: Progressing   Problem: Pain Management: Goal: Pain level will decrease with appropriate interventions Outcome: Progressing   Reeves Forth, RN 04/10/18 1:57 AM

## 2018-04-10 NOTE — Progress Notes (Signed)
Physical Therapy Treatment Patient Details Name: Carl Vang MRN: [id number] DOB: April 25, 1961 Today's Date: 04/10/2018    History of Present Illness Pt is a 57 year old male s/p R TKA with hx of L BKA    PT Comments    Pt is progressing well with mobility, he ambulated 300' with RW, no loss of balance. Reviewed TKA HEP. Will review stair training with family tomorrow, then expect he will be ready to DC home from PT standpoint.    Follow Up Recommendations  Follow surgeon's recommendation for DC plan and follow-up therapies     Equipment Recommendations  Rolling walker with 5" wheels;Other (comment)(pt would like overhead trapeze for home)    Recommendations for Other Services       Precautions / Restrictions Precautions Precautions: Fall;Knee; reviewed no pillow under knee Precaution Comments: pt performed SLR Required Braces or Orthoses: Knee Immobilizer - Right(used for stair training) Restrictions Weight Bearing Restrictions: No Other Position/Activity Restrictions: WBAT    Mobility  Bed Mobility Overal bed mobility: Modified Independent Bed Mobility: Supine to Sit     Supine to sit: Modified independent (Device/Increase time)     General bed mobility comments: up in recliner; donned LLE prosthesis independently  Transfers Overall transfer level: Needs assistance Equipment used: Rolling walker (2 wheeled) Transfers: Sit to/from Stand Sit to Stand: Supervision         General transfer comment: good hand placement/safety awareness  Ambulation/Gait Ambulation/Gait assistance: Min guard Gait Distance (Feet): 300 Feet Assistive device: Rolling walker (2 wheeled) Gait Pattern/deviations: Step-through pattern;Decreased stance time - right     General Gait Details: steady with RW, no loss of balance, denied pain while ambulating   Stairs Stairs: Yes Stairs assistance: Min assist Stair Management: One rail Right;Forwards;With crutches Number of Stairs:  9 General stair comments: min assist for balance, VCs for sequencing with crutch, wore R KI for extra support   Wheelchair Mobility    Modified Rankin (Stroke Patients Only)       Balance                                            Cognition Arousal/Alertness: Awake/alert Behavior During Therapy: WFL for tasks assessed/performed Overall Cognitive Status: Within Functional Limits for tasks assessed                                        Exercises Total Joint Exercises Ankle Circles/Pumps: AROM;10 reps;Right Quad Sets: AROM;10 reps;Right Short Arc Quad: 10 reps;Right;AROM Heel Slides: AAROM;10 reps;Right;Supine Straight Leg Raises: Right;AROM;10 reps;Supine Long Arc Quad: AROM;Right;10 reps;Seated Knee Flexion: AROM;AAROM;Right;Seated;10 reps Goniometric ROM: 0-75* AAROM R knee    General Comments        Pertinent Vitals/Pain Pain Score: 2  Pain Location: right knee with walking Pain Descriptors / Indicators: Sore Pain Intervention(s): Limited activity within patient's tolerance;Monitored during session;Ice applied(pt declined pain medication)    Home Living                      Prior Function            PT Goals (current goals can now be found in the care plan section) Acute Rehab PT Goals PT Goal Formulation: With patient Time For Goal Achievement: 04/13/18 Potential to Achieve Goals:  Good Progress towards PT goals: Progressing toward goals    Frequency    7X/week      PT Plan Current plan remains appropriate    Co-evaluation              AM-PAC PT "6 Clicks" Daily Activity  Outcome Measure  Difficulty turning over in bed (including adjusting bedclothes, sheets and blankets)?: None Difficulty moving from lying on back to sitting on the side of the bed? : None Difficulty sitting down on and standing up from a chair with arms (e.g., wheelchair, bedside commode, etc,.)?: A Little Help needed moving  to and from a bed to chair (including a wheelchair)?: A Little Help needed walking in hospital room?: A Little Help needed climbing 3-5 steps with a railing? : A Little 6 Click Score: 20    End of Session Equipment Utilized During Treatment: Gait belt Activity Tolerance: Patient tolerated treatment well Patient left: with call bell/phone within reach;in chair Nurse Communication: Mobility status PT Visit Diagnosis: Other abnormalities of gait and mobility (R26.89)     Time: 1610-9604 PT Time Calculation (min) (ACUTE ONLY): 21 min  Charges:  $Gait Training: 8-22 mins $Therapeutic Exercise: 8-22 mins                    Ralene Bathe Kistler PT 04/10/2018  Acute Rehabilitation Services Pager [phone] Office [phone]

## 2018-04-10 NOTE — Progress Notes (Signed)
    Patient doing well PO Day 2, he has made excellent progress with PT. He states the overhead trapeze bar is very beneficial to him and he would like one for home use. He denies any substantial px about the knee. He is eating and drinking well, denies N/V/D/SOB, + passing gas.    Physical Exam: Vitals:   04/09/18 2125 04/10/18 0603  BP: 132/76 128/84  Pulse: 70 76  Resp: 16 16  Temp: (!) 97.3 F (36.3 C) (!) 97.4 F (36.3 C)  SpO2: 97% 96%    Dressing in place, CDI, sitting up in chair comfortably, distal comp soft, 2+ DPP = BIL, NVI  POD #2 s/p R TKA per Dr Luiz Blare team. Pt has BKA on contralateral side.  - up with PT/OT, encourage mobilization  -Pt requesting trapeze bar for home use, OK to order if PT recommends  - Continue current pain regimen - ASA 325 BID for anticoagulation  - likely d/c home Monday with f/u in 2 weeks

## 2018-04-11 ENCOUNTER — Encounter (HOSPITAL_COMMUNITY): Payer: Self-pay | Admitting: Orthopedic Surgery

## 2018-04-11 LAB — CBC
HCT: 33.7 % — ABNORMAL LOW (ref 39.0–52.0)
Hemoglobin: 11.6 g/dL — ABNORMAL LOW (ref 13.0–17.0)
MCH: 29.2 pg (ref 26.0–34.0)
MCHC: 34.4 g/dL (ref 30.0–36.0)
MCV: 84.9 fL (ref 78.0–100.0)
Platelets: 279 10*3/uL (ref 150–400)
RBC: 3.97 MIL/uL — ABNORMAL LOW (ref 4.22–5.81)
RDW: 13.4 % (ref 11.5–15.5)
WBC: 19.2 10*3/uL — ABNORMAL HIGH (ref 4.0–10.5)

## 2018-04-11 NOTE — Progress Notes (Signed)
Physical Therapy Treatment Patient Details Name: Carl Vang MRN: [id number] DOB: October 24, 1960 Today's Date: 04/11/2018    History of Present Illness Pt is a 57 year old male s/p R TKA with hx of L BKA    PT Comments    Stair training reviewed with pt and pt's son and daughter. Reviewed HEP. Pt ambulated 350' with RW without loss of balance. PT goals met, he is ready to DC home from PT standpoint.    Follow Up Recommendations  Follow surgeon's recommendation for DC plan and follow-up therapies     Equipment Recommendations  Rolling walker with 5" wheels;Other (comment)(pt would like overhead trapeze for home)    Recommendations for Other Services       Precautions / Restrictions Precautions Precautions: Fall;Knee Precaution Comments: pt performed SLR Required Braces or Orthoses: Knee Immobilizer - Right(used for stair training) Restrictions Weight Bearing Restrictions: No Other Position/Activity Restrictions: WBAT    Mobility  Bed Mobility Overal bed mobility: Modified Independent       Supine to sit: Modified independent (Device/Increase time)     General bed mobility comments: donned LLE prosthesis independently  Transfers Overall transfer level: Needs assistance Equipment used: Rolling walker (2 wheeled) Transfers: Sit to/from Stand Sit to Stand: Supervision         General transfer comment: good hand placement/safety awareness  Ambulation/Gait Ambulation/Gait assistance: Min guard Gait Distance (Feet): 350 Feet Assistive device: Rolling walker (2 wheeled) Gait Pattern/deviations: Step-through pattern;Decreased stance time - right Gait velocity: WFL   General Gait Details: steady with RW, no loss of balance   Stairs Stairs: Yes   Stair Management: One rail Right;Forwards;With crutches Number of Stairs: 9 General stair comments: min assist for balance, VCs for sequencing with crutch, wore R KI for extra support; son and daughter present and  provided min/guard assist   Wheelchair Mobility    Modified Rankin (Stroke Patients Only)       Balance                                            Cognition Arousal/Alertness: Awake/alert Behavior During Therapy: WFL for tasks assessed/performed Overall Cognitive Status: Within Functional Limits for tasks assessed                                        Exercises Total Joint Exercises Ankle Circles/Pumps: AROM;10 reps;Right Quad Sets: AROM;10 reps;Right Short Arc Quad: 10 reps;Right;AROM Heel Slides: AAROM;10 reps;Right;Supine Hip ABduction/ADduction: AROM;10 reps;Right Straight Leg Raises: Right;AROM;10 reps;Supine Long Arc Quad: AROM;Right;10 reps;Seated Knee Flexion: AROM;AAROM;Right;Seated;10 reps Goniometric ROM: 0-70* AAROM R knee    General Comments        Pertinent Vitals/Pain Pain Score: 3  Pain Location: right knee with walking Pain Descriptors / Indicators: Sore Pain Intervention(s): Limited activity within patient's tolerance;Monitored during session;Ice applied(pt declined pain meds)    Home Living                      Prior Function            PT Goals (current goals can now be found in the care plan section) Acute Rehab PT Goals Patient Stated Goal: to ride a bike PT Goal Formulation: With patient Time For Goal Achievement: 04/13/18 Potential to Achieve Goals: Good  Progress towards PT goals: Goals met/education completed, patient discharged from PT    Frequency    7X/week      PT Plan Current plan remains appropriate    Co-evaluation              AM-PAC PT "6 Clicks" Daily Activity  Outcome Measure  Difficulty turning over in bed (including adjusting bedclothes, sheets and blankets)?: None Difficulty moving from lying on back to sitting on the side of the bed? : None Difficulty sitting down on and standing up from a chair with arms (e.g., wheelchair, bedside commode, etc,.)?:  None Help needed moving to and from a bed to chair (including a wheelchair)?: None Help needed walking in hospital room?: None Help needed climbing 3-5 steps with a railing? : A Little 6 Click Score: 23    End of Session Equipment Utilized During Treatment: Gait belt Activity Tolerance: Patient tolerated treatment well Patient left: with call bell/phone within reach;in chair;with family/visitor present Nurse Communication: Mobility status PT Visit Diagnosis: Other abnormalities of gait and mobility (R26.89)     Time: 5974-1638 PT Time Calculation (min) (ACUTE ONLY): 33 min  Charges:  $Gait Training: 8-22 mins $Therapeutic Exercise: 8-22 mins                     Blondell Reveal Kistler PT 04/11/2018  Acute Rehabilitation Services Pager [phone] Office [phone]

## 2018-04-11 NOTE — Addendum Note (Signed)
Addendum  created 04/11/18 1610 by Elyn Peers, CRNA   Charge Capture section accepted

## 2018-04-11 NOTE — Discharge Summary (Signed)
Patient ID: Carl Vang MRN: [id number] DOB/AGE: [birth year] 57 y.o.  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 Hyperlipidemia   . Hypertension   . Osteoarthritis of knee   . Prediabetes     Surgeries: Procedure(s): RIGHT TOTAL KNEE ARTHROPLASTY on 04/08/2018   Consultants:   Discharged Condition: Improved  Hospital Course: Carl Vang is an 57 y.o. male who was admitted 04/08/2018 for operative treatment ofPrimary osteoarthritis of right knee. Patient has severe unremitting pain that affects sleep, daily activities, and work/hobbies. After pre-op clearance the patient was taken to the operating room on 04/08/2018 and underwent  Procedure(s): RIGHT TOTAL KNEE ARTHROPLASTY.    Patient was given perioperative antibiotics:  Anti-infectives (From admission, onward)   Start     Dose/Rate Route Frequency Ordered Stop   04/08/18 1800  ceFAZolin (ANCEF) IVPB 2g/100 mL premix     2 g 200 mL/hr over 30 Minutes Intravenous Every 6 hours 04/08/18 1503 04/09/18 0020   04/08/18 0830  ceFAZolin (ANCEF) IVPB 2g/100 mL premix     2 g 200 mL/hr over 30 Minutes Intravenous On call to O.R. 04/08/18 0820 04/08/18 1155       Patient was given sequential compression devices, early ambulation, and chemoprophylaxis to prevent DVT.  The patient made slow progress with physical therapy due to the fact that he has a previous history of a left BK amputation.  On the date of discharge the patient's vital signs are stable, he was afebrile, his right leg dressing was benign.  His calf was soft.  Patient benefited maximally from hospital stay and there were no complications.    Recent vital signs:  Patient Vitals for the past 24 hrs:  BP Temp Temp src Pulse Resp  SpO2  04/11/18 0634 114/70 97.7 F (36.5 C) Oral 86 16 99 %  04/10/18 2115 127/78 97.6 F (36.4 C) Axillary 74 16 97 %  04/10/18 1410 140/77 97.9 F (36.6 C) Oral 72 16 97 %     Recent laboratory studies:  Recent Labs    04/09/18 0429 04/10/18 0417 04/11/18 0427  WBC 16.6* 20.2* 19.2*  HGB 12.7* 11.8* 11.6*  HCT 37.3* 34.5* 33.7*  PLT 293 284 279  NA 137  --   --   K 3.9  --   --   CL 101  --   --   CO2 29  --   --   BUN 14  --   --   CREATININE 1.05  --   --   GLUCOSE 182*  --   --   CALCIUM 9.2  --   --      Discharge Medications:   Allergies as of 04/11/2018   No Known Allergies     Medication List    STOP taking these medications   meloxicam 15 MG tablet Commonly known as:  MOBIC     TAKE these medications   amLODipine 5 MG tablet Commonly known as:  NORVASC Take 1 tablet (5 mg total) by mouth daily. What changed:  when to take this   aspirin EC 325 MG tablet Take 1 tablet (325 mg total) by mouth 2 (two) times daily after a meal. Take x 1 month post op to decrease risk of blood clots.  atorvastatin 20 MG tablet Commonly known as:  LIPITOR Take 1 tablet (20 mg total) by mouth daily. What changed:  when to take this   docusate sodium 100 MG capsule Commonly known as:  COLACE Take 1 capsule (100 mg total) by mouth 2 (two) times daily.   hydrochlorothiazide 12.5 MG tablet Commonly known as:  HYDRODIURIL Take 1 tablet (12.5 mg total) by mouth daily. What changed:  when to take this   oxyCODONE-acetaminophen 5-325 MG tablet Commonly known as:  PERCOCET/ROXICET Take 1-2 tablets by mouth every 6 (six) hours as needed for severe pain.   tiZANidine 2 MG tablet Commonly known as:  ZANAFLEX Take 1 tablet (2 mg total) by mouth every 8 (eight) hours as needed for muscle spasms.            Discharge Care Instructions  (From admission, onward)         Start     Ordered   04/11/18 0000  Weight bearing as tolerated    Question Answer Comment   Laterality right   Extremity Lower      04/11/18 0819          Diagnostic Studies: Dg Chest 2 View  Result Date: 04/05/2018 CLINICAL DATA:  Preop exam for knee replacement. EXAM: CHEST - 2 VIEW COMPARISON:  10/24/2015. FINDINGS: Mediastinum and hilar structures normal. Lungs are clear. No pleural effusion or pneumothorax. Heart size normal. No acute bony abnormality. IMPRESSION: No acute cardiopulmonary disease. Electronically Signed   By: Maisie Fus  Register   On: 04/05/2018 07:24    Disposition: Discharge disposition: 01-Home or Self Care       Discharge Instructions    CPM   Complete by:  As directed    Continuous passive motion machine (CPM):      Use the CPM from 0 to 60 for 8 hours per day.      You may increase by 5-10 per day.  You may break it up into 2 or 3 sessions per day.      Use CPM for 1-2 weeks or until you are told to stop.   Call MD / Call 911   Complete by:  As directed    If you experience chest pain or shortness of breath, CALL 911 and be transported to the hospital emergency room.  If you develope a fever above 101 F, pus (white drainage) or increased drainage or redness at the wound, or calf pain, call your surgeon's office.   Constipation Prevention   Complete by:  As directed    Drink plenty of fluids.  Prune juice may be helpful.  You may use a stool softener, such as Colace (over the counter) 100 mg twice a day.  Use MiraLax (over the counter) for constipation as needed.   Diet general   Complete by:  As directed    Do not put a pillow under the knee. Place it under the heel.   Complete by:  As directed    Face-to-face encounter (required for Medicare/Medicaid patients)   Complete by:  As directed    I Harvie Junior certify that this patient is under my care and that I, or a nurse practitioner or physician's assistant working with me, had a face-to-face encounter that meets the physician face-to-face encounter requirements with this patient on  04/08/2018. The encounter with the patient was in whole, or in part for the following medical condition(s) which is the primary reason for home health care (List medical condition): Status  post right total knee replacement.  History of left below-knee amputation.   The encounter with the patient was in whole, or in part, for the following medical condition, which is the primary reason for home health care:  Status post right total knee replacement   I certify that, based on my findings, the following services are medically necessary home health services:  Physical therapy   Reason for Medically Necessary Home Health Services:   Therapy- Investment banker, operational, Patent examiner Therapy- Therapeutic Exercises to Increase Strength and Endurance     My clinical findings support the need for the above services:   Unable to leave home safely without assistance and/or assistive device Pain interferes with ambulation/mobility     Further, I certify that my clinical findings support that this patient is homebound due to:  Pain interferes with ambulation/mobility   Home Health   Complete by:  As directed    To provide the following care/treatments:  PT   Increase activity slowly as tolerated   Complete by:  As directed    Weight bearing as tolerated   Complete by:  As directed    Laterality:  right   Extremity:  Lower      Follow-up Information    Jodi Geralds, MD. Schedule an appointment as soon as possible for a visit in 2 weeks.   Specialty:  Orthopedic Surgery Contact information: [address] [place] [address] [phone]        [facility] Follow up.   Specialty:  Home Health Services Why:  Home Health Physical Therapy-agency will call to arrange initial visit Contact information: [address] [place] [address] [place] [address] [phone]            Signed: Matthew Folks 04/11/2018, 8:20 AM

## 2018-04-11 NOTE — Plan of Care (Signed)
  Problem: Education: Goal: Knowledge of General Education information will improve Description Including pain rating scale, medication(s)/side effects and non-pharmacologic comfort measures Outcome: Progressing   Problem: Health Behavior/Discharge Planning: Goal: Ability to manage health-related needs will improve Outcome: Progressing   Problem: Clinical Measurements: Goal: Ability to maintain clinical measurements within normal limits will improve Outcome: Progressing Goal: Will remain free from infection Outcome: Progressing Goal: Diagnostic test results will improve Outcome: Progressing Goal: Respiratory complications will improve Outcome: Progressing Goal: Cardiovascular complication will be avoided Outcome: Progressing   Problem: Activity: Goal: Risk for activity intolerance will decrease Outcome: Progressing   Problem: Coping: Goal: Level of anxiety will decrease Outcome: Progressing   Problem: Elimination: Goal: Will not experience complications related to bowel motility Outcome: Progressing Goal: Will not experience complications related to urinary retention Outcome: Progressing   Problem: Pain Managment: Goal: General experience of comfort will improve Outcome: Progressing   Problem: Safety: Goal: Ability to remain free from injury will improve Outcome: Progressing   Problem: Skin Integrity: Goal: Risk for impaired skin integrity will decrease Outcome: Progressing   Problem: Education: Goal: Knowledge of the prescribed therapeutic regimen will improve Outcome: Progressing Goal: Individualized Educational Video(s) Outcome: Progressing   Problem: Activity: Goal: Ability to avoid complications of mobility impairment will improve Outcome: Progressing Goal: Range of joint motion will improve Outcome: Progressing   Problem: Clinical Measurements: Goal: Postoperative complications will be avoided or minimized Outcome: Progressing   Problem: Pain  Management: Goal: Pain level will decrease with appropriate interventions Outcome: Progressing   Problem: Skin Integrity: Goal: Will show signs of wound healing Outcome: Progressing

## 2018-04-11 NOTE — Progress Notes (Signed)
Subjective: 3 Days Post-Op Procedure(s) (LRB): RIGHT TOTAL KNEE ARTHROPLASTY (Right) Patient reports pain as moderate.  Taking by mouth and voiding okay.  Making progress with physical therapy.  Reports that he would like an overhead trapeze for his bed.  Objective: Vital signs in last 24 hours: Temp:  [97.6 F (36.4 C)-97.9 F (36.6 C)] 97.7 F (36.5 C) (10/07 0634) Pulse Rate:  [72-86] 86 (10/07 0634) Resp:  [16] 16 (10/07 0634) BP: (114-140)/(70-78) 114/70 (10/07 0634) SpO2:  [97 %-99 %] 99 % (10/07 0634)  Intake/Output from previous day: 10/06 0701 - 10/07 0700 In: 1080 [P.O.:1080] Out: 2150 [Urine:2150] Intake/Output this shift: No intake/output data recorded.  Recent Labs    04/09/18 0429 04/10/18 0417 04/11/18 0427  HGB 12.7* 11.8* 11.6*   Recent Labs    04/10/18 0417 04/11/18 0427  WBC 20.2* 19.2*  RBC 4.09* 3.97*  HCT 34.5* 33.7*  PLT 284 279   Recent Labs    04/09/18 0429  NA 137  K 3.9  CL 101  CO2 29  BUN 14  CREATININE 1.05  GLUCOSE 182*  CALCIUM 9.2   No results for input(s): LABPT, INR in the last 72 hours. Right knee exam: Neurovascular intact Sensation intact distally Intact pulses distally Dorsiflexion/Plantar flexion intact Incision: dressing C/D/I Compartment soft  Anticipated LOS equal to or greater than 2 midnights due to - Age 11 and older with one or more of the following:  - Obesity  - Expected need for hospital services (PT, OT, Nursing) required for safe  discharge  - Anticipated need for postoperative skilled nursing care or inpatient rehab  - Active co-morbidities: History of left below-knee amputation. OR   - Unanticipated findings during/Post Surgery: Slow post-op progression: GI, pain control, mobility  -    Assessment/Plan: 3 Days Post-Op Procedure(s) (LRB): RIGHT TOTAL KNEE ARTHROPLASTY (Right) Status post left BK amputation. Plan: Up with therapy Discharge home with home health  We will see if there is  an option on getting an overhead trapeze for his bed. Aspirin enteric-coated 325 mg twice daily x1 month for DVT prophylaxis. Follow-up with Dr. Luiz Blare in 2 weeks.    Matthew Folks 04/11/2018, 8:16 AM

## 2018-04-12 ENCOUNTER — Ambulatory Visit: Payer: Medicare HMO | Admitting: Nurse Practitioner

## 2018-04-12 DIAGNOSIS — Z471 Aftercare following joint replacement surgery: Secondary | ICD-10-CM | POA: Diagnosis not present

## 2018-04-12 DIAGNOSIS — E785 Hyperlipidemia, unspecified: Secondary | ICD-10-CM | POA: Diagnosis not present

## 2018-04-12 DIAGNOSIS — I10 Essential (primary) hypertension: Secondary | ICD-10-CM | POA: Diagnosis not present

## 2018-04-12 DIAGNOSIS — Z89512 Acquired absence of left leg below knee: Secondary | ICD-10-CM | POA: Diagnosis not present

## 2018-04-12 DIAGNOSIS — S88112A Complete traumatic amputation at level between knee and ankle, left lower leg, initial encounter: Secondary | ICD-10-CM | POA: Diagnosis not present

## 2018-04-12 DIAGNOSIS — Z96651 Presence of right artificial knee joint: Secondary | ICD-10-CM | POA: Diagnosis not present

## 2018-04-15 DIAGNOSIS — Z89512 Acquired absence of left leg below knee: Secondary | ICD-10-CM | POA: Diagnosis not present

## 2018-04-15 DIAGNOSIS — E785 Hyperlipidemia, unspecified: Secondary | ICD-10-CM | POA: Diagnosis not present

## 2018-04-15 DIAGNOSIS — Z96651 Presence of right artificial knee joint: Secondary | ICD-10-CM | POA: Diagnosis not present

## 2018-04-15 DIAGNOSIS — I10 Essential (primary) hypertension: Secondary | ICD-10-CM | POA: Diagnosis not present

## 2018-04-15 DIAGNOSIS — Z471 Aftercare following joint replacement surgery: Secondary | ICD-10-CM | POA: Diagnosis not present

## 2018-04-18 DIAGNOSIS — Z96651 Presence of right artificial knee joint: Secondary | ICD-10-CM | POA: Diagnosis not present

## 2018-04-18 DIAGNOSIS — Z471 Aftercare following joint replacement surgery: Secondary | ICD-10-CM | POA: Diagnosis not present

## 2018-04-18 DIAGNOSIS — I10 Essential (primary) hypertension: Secondary | ICD-10-CM | POA: Diagnosis not present

## 2018-04-18 DIAGNOSIS — E785 Hyperlipidemia, unspecified: Secondary | ICD-10-CM | POA: Diagnosis not present

## 2018-04-18 DIAGNOSIS — Z89512 Acquired absence of left leg below knee: Secondary | ICD-10-CM | POA: Diagnosis not present

## 2018-04-19 DIAGNOSIS — I10 Essential (primary) hypertension: Secondary | ICD-10-CM | POA: Diagnosis not present

## 2018-04-19 DIAGNOSIS — Z471 Aftercare following joint replacement surgery: Secondary | ICD-10-CM | POA: Diagnosis not present

## 2018-04-19 DIAGNOSIS — E785 Hyperlipidemia, unspecified: Secondary | ICD-10-CM | POA: Diagnosis not present

## 2018-04-19 DIAGNOSIS — Z96651 Presence of right artificial knee joint: Secondary | ICD-10-CM | POA: Diagnosis not present

## 2018-04-19 DIAGNOSIS — Z89512 Acquired absence of left leg below knee: Secondary | ICD-10-CM | POA: Diagnosis not present

## 2018-04-20 DIAGNOSIS — Z471 Aftercare following joint replacement surgery: Secondary | ICD-10-CM | POA: Diagnosis not present

## 2018-04-20 DIAGNOSIS — Z89512 Acquired absence of left leg below knee: Secondary | ICD-10-CM | POA: Diagnosis not present

## 2018-04-20 DIAGNOSIS — I10 Essential (primary) hypertension: Secondary | ICD-10-CM | POA: Diagnosis not present

## 2018-04-20 DIAGNOSIS — E785 Hyperlipidemia, unspecified: Secondary | ICD-10-CM | POA: Diagnosis not present

## 2018-04-20 DIAGNOSIS — Z96651 Presence of right artificial knee joint: Secondary | ICD-10-CM | POA: Diagnosis not present

## 2018-04-25 DIAGNOSIS — Z471 Aftercare following joint replacement surgery: Secondary | ICD-10-CM | POA: Diagnosis not present

## 2018-04-28 DIAGNOSIS — M25661 Stiffness of right knee, not elsewhere classified: Secondary | ICD-10-CM | POA: Diagnosis not present

## 2018-04-28 DIAGNOSIS — Z96651 Presence of right artificial knee joint: Secondary | ICD-10-CM | POA: Diagnosis not present

## 2018-05-05 DIAGNOSIS — M25661 Stiffness of right knee, not elsewhere classified: Secondary | ICD-10-CM | POA: Diagnosis not present

## 2018-05-05 DIAGNOSIS — Z96651 Presence of right artificial knee joint: Secondary | ICD-10-CM | POA: Diagnosis not present

## 2018-05-06 ENCOUNTER — Inpatient Hospital Stay: Payer: Medicare HMO | Admitting: Nurse Practitioner

## 2018-05-12 DIAGNOSIS — Z96651 Presence of right artificial knee joint: Secondary | ICD-10-CM | POA: Diagnosis not present

## 2018-05-12 DIAGNOSIS — M25661 Stiffness of right knee, not elsewhere classified: Secondary | ICD-10-CM | POA: Diagnosis not present

## 2018-05-13 ENCOUNTER — Inpatient Hospital Stay: Payer: Medicare HMO

## 2018-05-13 DIAGNOSIS — S88112A Complete traumatic amputation at level between knee and ankle, left lower leg, initial encounter: Secondary | ICD-10-CM | POA: Diagnosis not present

## 2018-05-17 DIAGNOSIS — Z96651 Presence of right artificial knee joint: Secondary | ICD-10-CM | POA: Diagnosis not present

## 2018-05-17 DIAGNOSIS — M25661 Stiffness of right knee, not elsewhere classified: Secondary | ICD-10-CM | POA: Diagnosis not present

## 2018-05-19 DIAGNOSIS — Z96651 Presence of right artificial knee joint: Secondary | ICD-10-CM | POA: Diagnosis not present

## 2018-05-19 DIAGNOSIS — M25661 Stiffness of right knee, not elsewhere classified: Secondary | ICD-10-CM | POA: Diagnosis not present

## 2018-05-26 DIAGNOSIS — M25661 Stiffness of right knee, not elsewhere classified: Secondary | ICD-10-CM | POA: Diagnosis not present

## 2018-05-26 DIAGNOSIS — Z96651 Presence of right artificial knee joint: Secondary | ICD-10-CM | POA: Diagnosis not present

## 2018-05-28 ENCOUNTER — Emergency Department (HOSPITAL_COMMUNITY): Payer: Medicare HMO

## 2018-05-28 ENCOUNTER — Emergency Department (HOSPITAL_COMMUNITY)
Admission: EM | Admit: 2018-05-28 | Discharge: 2018-05-28 | Disposition: A | Discharge status: Home / Self Care | Payer: Medicare HMO | Attending: Emergency Medicine | Admitting: Emergency Medicine

## 2018-05-28 ENCOUNTER — Other Ambulatory Visit: Payer: Self-pay

## 2018-05-28 ENCOUNTER — Encounter (HOSPITAL_COMMUNITY): Payer: Self-pay

## 2018-05-28 DIAGNOSIS — I10 Essential (primary) hypertension: Secondary | ICD-10-CM | POA: Diagnosis not present

## 2018-05-28 DIAGNOSIS — Z96651 Presence of right artificial knee joint: Secondary | ICD-10-CM | POA: Insufficient documentation

## 2018-05-28 DIAGNOSIS — J9811 Atelectasis: Secondary | ICD-10-CM | POA: Diagnosis not present

## 2018-05-28 DIAGNOSIS — Z79899 Other long term (current) drug therapy: Secondary | ICD-10-CM | POA: Diagnosis not present

## 2018-05-28 DIAGNOSIS — R270 Ataxia, unspecified: Secondary | ICD-10-CM | POA: Diagnosis not present

## 2018-05-28 DIAGNOSIS — R42 Dizziness and giddiness: Secondary | ICD-10-CM

## 2018-05-28 DIAGNOSIS — R0902 Hypoxemia: Secondary | ICD-10-CM | POA: Diagnosis not present

## 2018-05-28 LAB — HEPATIC FUNCTION PANEL
ALT: 16 U/L (ref 0–44)
AST: 18 U/L (ref 15–41)
Albumin: 4 g/dL (ref 3.5–5.0)
Alkaline Phosphatase: 68 U/L (ref 38–126)
Bilirubin, Direct: 0.2 mg/dL (ref 0.0–0.2)
Indirect Bilirubin: 0.8 mg/dL (ref 0.3–0.9)
Total Bilirubin: 1 mg/dL (ref 0.3–1.2)
Total Protein: 7.4 g/dL (ref 6.5–8.1)

## 2018-05-28 LAB — BASIC METABOLIC PANEL
Anion gap: 9 (ref 5–15)
BUN: 12 mg/dL (ref 6–20)
CO2: 27 mmol/L (ref 22–32)
Calcium: 9.6 mg/dL (ref 8.9–10.3)
Chloride: 101 mmol/L (ref 98–111)
Creatinine, Ser: 0.86 mg/dL (ref 0.61–1.24)
GFR calc Af Amer: >60 mL/min (ref >60)
GFR calc non Af Amer: >60 mL/min (ref >60)
Glucose, Bld: 99 mg/dL (ref 70–99)
Potassium: 3.6 mmol/L (ref 3.5–5.1)
Sodium: 137 mmol/L (ref 135–145)

## 2018-05-28 LAB — CBC
HCT: 38.6 % — ABNORMAL LOW (ref 39.0–52.0)
Hemoglobin: 12.3 g/dL — ABNORMAL LOW (ref 13.0–17.0)
MCH: 27.4 pg (ref 26.0–34.0)
MCHC: 31.9 g/dL (ref 30.0–36.0)
MCV: 86 fL (ref 80.0–100.0)
Platelets: 306 10*3/uL (ref 150–400)
RBC: 4.49 MIL/uL (ref 4.22–5.81)
RDW: 12.6 % (ref 11.5–15.5)
WBC: 8.7 10*3/uL (ref 4.0–10.5)
nRBC: 0 % (ref 0.0–0.2)

## 2018-05-28 LAB — URINALYSIS, ROUTINE W REFLEX MICROSCOPIC
Bacteria, UA: NONE SEEN
Bilirubin Urine: NEGATIVE
Glucose, UA: NEGATIVE mg/dL
Hgb urine dipstick: NEGATIVE
Ketones, ur: NEGATIVE mg/dL
Nitrite: NEGATIVE
Protein, ur: NEGATIVE mg/dL
Specific Gravity, Urine: 1.019 (ref 1.005–1.030)
pH: 6 (ref 5.0–8.0)

## 2018-05-28 LAB — CBG MONITORING, ED: Glucose-Capillary: 85 mg/dL (ref 70–99)

## 2018-05-28 LAB — ETHANOL: Alcohol, Ethyl (B): <10 mg/dL (ref <10)

## 2018-05-28 MED ORDER — MECLIZINE HCL 25 MG PO TABS
25.0000 mg | ORAL_TABLET | Freq: Once | ORAL | Status: AC
  Administered 2018-05-28: 25 mg via ORAL
  Filled 2018-05-28: qty 1

## 2018-05-28 MED ORDER — SODIUM CHLORIDE 0.9 % IV BOLUS
1000.0000 mL | Freq: Once | INTRAVENOUS | Status: AC
  Administered 2018-05-28: 1000 mL via INTRAVENOUS

## 2018-05-28 MED ORDER — SODIUM CHLORIDE 0.9 % IV SOLN: INTRAVENOUS | Status: DC

## 2018-05-28 MED ORDER — MECLIZINE HCL 25 MG PO TABS: 25.0000 mg | ORAL_TABLET | Freq: Three times a day (TID) | ORAL | 0 refills | Status: DC | PRN

## 2018-05-28 NOTE — ED Provider Notes (Addendum)
[facility] EMERGENCY DEPARTMENT Provider Note   CSN: [id number] Arrival date & time: 05/28/18  1124     History   Chief Complaint Chief Complaint  Patient presents with  . Dizziness    HPI Carl Vang is a 57 y.o. male.  Patient presenting with a complaint of dizziness which is actually room spinning vertigo-like since Wednesday.  No headache associated with it.  Patient's past medical history also significant for left BKA in 2003 from a work-related accident.  Patient brought in by EMS.  They reported some orthostatics on the scene.  Patient denies chest pain or shortness of breath or any abdominal pain or any extremity weakness or any difficulty speaking.  Never had any room spinning like this before.     Past Medical History:  Diagnosis Date  . Hyperlipidemia   . Hypertension   . Osteoarthritis of knee   . Prediabetes     Patient Active Problem List   Diagnosis Date Noted  . Elevated hemoglobin A1c 01/28/2017  . Hypertension 04/08/2016  . Primary osteoarthritis of right knee 01/23/2015  . IFG (impaired fasting glucose) 04/09/2014  . Traumatic amputation of left leg below knee, sequela (HCC) 06/16/2013    Past Surgical History:  Procedure Laterality Date  . COLONOSCOPY WITH PROPOFOL N/A 08/06/2014   Procedure: COLONOSCOPY WITH PROPOFOL;  Surgeon: Charolett Bumpers, MD;  Location: [facility] ENDOSCOPY;  Service: Endoscopy;  Laterality: N/A;  . left leg below knee amputation   2003   in accident at work   . MOUTH SURGERY    . TOTAL KNEE ARTHROPLASTY Right 04/08/2018   Procedure: RIGHT TOTAL KNEE ARTHROPLASTY;  Surgeon: Jodi Geralds, MD;  Location: [facility] ORS;  Service: Orthopedics;  Laterality: Right;        Home Medications    Prior to Admission medications   Medication Sig Start Date End Date Taking? Authorizing Provider  amLODipine (NORVASC) 5 MG tablet Take 1 tablet (5 mg total) by mouth daily. Patient taking differently: Take 5 mg by mouth every  evening.  01/04/18   Claiborne Rigg, NP  aspirin EC 325 MG tablet Take 1 tablet (325 mg total) by mouth 2 (two) times daily after a meal. Take x 1 month post op to decrease risk of blood clots. 04/08/18   Marshia Ly, PA-C  atorvastatin (LIPITOR) 20 MG tablet Take 1 tablet (20 mg total) by mouth daily. Patient taking differently: Take 20 mg by mouth every evening.  01/04/18   Claiborne Rigg, NP  docusate sodium (COLACE) 100 MG capsule Take 1 capsule (100 mg total) by mouth 2 (two) times daily. 04/08/18   Marshia Ly, PA-C  hydrochlorothiazide (HYDRODIURIL) 12.5 MG tablet Take 1 tablet (12.5 mg total) by mouth daily. Patient taking differently: Take 12.5 mg by mouth every evening.  01/04/18   Claiborne Rigg, NP  oxyCODONE-acetaminophen (PERCOCET/ROXICET) 5-325 MG tablet Take 1-2 tablets by mouth every 6 (six) hours as needed for severe pain. 04/08/18   Marshia Ly, PA-C  tiZANidine (ZANAFLEX) 2 MG tablet Take 1 tablet (2 mg total) by mouth every 8 (eight) hours as needed for muscle spasms. 04/08/18   Marshia Ly, PA-C    Family History Family History  Problem Relation Age of Onset  . Diabetes Mother   . Hypertension Mother   . Cancer Mother   . Stroke Mother   . Diabetes Father   . Hypertension Father   . Diabetes Daughter   . Hypertension Daughter   .  Heart disease Daughter     Social History Social History   Tobacco Use  . Smoking status: Never Smoker  . Smokeless tobacco: Never Used  Substance Use Topics  . Alcohol use: Not Currently  . Drug use: No     Allergies   Patient has no known allergies.   Review of Systems Review of Systems  Constitutional: Negative for fever.  HENT: Negative for congestion.   Respiratory: Negative for shortness of breath.   Cardiovascular: Negative for chest pain.  Gastrointestinal: Negative for abdominal pain.  Genitourinary: Negative for dysuria.  Musculoskeletal: Negative for back pain.  Skin: Negative for rash.    Neurological: Positive for dizziness and light-headedness. Negative for syncope and headaches.  Psychiatric/Behavioral: Negative for confusion.     Physical Exam Updated Vital Signs BP 125/73   Pulse 66   Temp 97.7 F (36.5 C) (Oral)   Resp 11   Ht 1.651 m (5\' 5" )   Wt 74.8 kg   SpO2 100%   BMI 27.46 kg/m   Physical Exam  Constitutional: He is oriented to person, place, and time. He appears well-developed and well-nourished. No distress.  HENT:  Head: Normocephalic and atraumatic.  Mouth/Throat: Oropharynx is clear and moist.  Moving head left to right shows evidence of recurrent vertigo  Eyes: Pupils are equal, round, and reactive to light. Conjunctivae and EOM are normal.  No nystagmus  Neck: Neck supple.  Cardiovascular: Normal rate, regular rhythm and normal heart sounds.  Pulmonary/Chest: Effort normal and breath sounds normal. No respiratory distress.  Abdominal: Soft. Bowel sounds are normal. There is no tenderness.  Musculoskeletal: Normal range of motion.  Neurological: He is alert and oriented to person, place, and time. No cranial nerve deficit or sensory deficit. He exhibits normal muscle tone. Coordination normal.  Skin: Skin is warm.  Nursing note and vitals reviewed.    ED Treatments / Results  Labs (all labs ordered are listed, but only abnormal results are displayed) Labs Reviewed  CBC - Abnormal; Notable for the following components:      Result Value   Hemoglobin 12.3 (*)    HCT 38.6 (*)    All other components within normal limits  URINALYSIS, ROUTINE W REFLEX MICROSCOPIC - Abnormal; Notable for the following components:   Leukocytes, UA MODERATE (*)    All other components within normal limits  BASIC METABOLIC PANEL  HEPATIC FUNCTION PANEL  ETHANOL  CBG MONITORING, ED    EKG EKG Interpretation  Date/Time:  Saturday May 28 2018 11:28:22 EST Ventricular Rate:  68 PR Interval:    QRS Duration: 90 QT Interval:  386 QTC  Calculation: 411 R Axis:   17 Text Interpretation:  Sinus rhythm Anteroseptal infarct, old Artifact No significant change since last tracing Confirmed by Vanetta MuldersZackowski, Cedricka Sackrider [phone]([id number]) on 05/28/2018 11:54:37 AM Also confirmed by Vanetta MuldersZackowski, Jocabed Cheese [phone]([id number])  on 05/28/2018 12:58:37 PM   Radiology Dg Chest 2 View  Result Date: 05/28/2018 CLINICAL DATA:  Orthostatic hypotension, dizziness EXAM: CHEST - 2 VIEW COMPARISON:  04/04/2018 FINDINGS: Upper normal heart size. Elongation of thoracic aorta. Mediastinal contours and pulmonary vascularity normal. Bibasilar atelectasis. Lungs otherwise clear. No infiltrate, pleural effusion or pneumothorax. IMPRESSION: Bibasilar atelectasis. Electronically Signed   By: Ulyses SouthwardMark  Boles M.D.   On: 05/28/2018 13:39    Procedures Procedures (including critical care time)  Medications Ordered in ED Medications  0.9 %  sodium chloride infusion (has no administration in time range)  meclizine (ANTIVERT) tablet 25 mg (25 mg Oral Given  05/28/18 1328)  sodium chloride 0.9 % bolus 1,000 mL (1,000 mLs Intravenous New Bag/Given 05/28/18 1329)     Initial Impression / Assessment and Plan / ED Course  I have reviewed the triage vital signs and the nursing notes.  Pertinent labs & imaging results that were available during my care of the patient were reviewed by me and considered in my medical decision making (see chart for details).     Patient with new onset of vertigo.  Patient also EMS reported orthostatic hypotension.  Patient vital signs here with normal blood pressure normal heart rate patient did receive 1 L fluids and received some Antivert.  Patient states he feels better with this but still has some room spinning.  Chest x-ray negative labs without significant abnormalities.  Head CT results are pending.  If normal would recommend MRI brain to rule out stroke etiology for the vertigo.  If that is negative patient can be treated at home with Antivert.  Head CT is  negative.  MRIs been ordered.  Final Clinical Impressions(s) / ED Diagnoses   Final diagnoses:  Vertigo    ED Discharge Orders    None       Vanetta Mulders, MD 05/28/18 1550    Vanetta Mulders, MD 05/28/18 [phone]

## 2018-05-28 NOTE — ED Notes (Signed)
Patient verbalizes understanding of discharge instructions. Opportunity for questioning and answers were provided. Armband removed by staff, pt discharged from ED in wheelchair.  

## 2018-05-28 NOTE — Discharge Instructions (Signed)
Please take the medicine for dizziness. MRI is not showing stroke. See your doctor in 1 week.

## 2018-05-28 NOTE — ED Notes (Signed)
Patient transported to X-ray 

## 2018-05-28 NOTE — ED Notes (Signed)
Patient transported to MRI 

## 2018-05-28 NOTE — ED Notes (Signed)
Patient transported to CT 

## 2018-05-28 NOTE — ED Triage Notes (Signed)
Pt BIB [facility] for eval of dizziness upon standing onset Wednesday. EMS reports +orthostatics on scene. Pt denies chest pain/SOB.

## 2018-05-29 DIAGNOSIS — M25661 Stiffness of right knee, not elsewhere classified: Secondary | ICD-10-CM | POA: Diagnosis not present

## 2018-05-29 DIAGNOSIS — Z96651 Presence of right artificial knee joint: Secondary | ICD-10-CM | POA: Diagnosis not present

## 2018-05-30 DIAGNOSIS — Z96651 Presence of right artificial knee joint: Secondary | ICD-10-CM | POA: Diagnosis not present

## 2018-05-30 DIAGNOSIS — M25661 Stiffness of right knee, not elsewhere classified: Secondary | ICD-10-CM | POA: Diagnosis not present

## 2018-06-09 DIAGNOSIS — M25661 Stiffness of right knee, not elsewhere classified: Secondary | ICD-10-CM | POA: Diagnosis not present

## 2018-06-09 DIAGNOSIS — Z96651 Presence of right artificial knee joint: Secondary | ICD-10-CM | POA: Diagnosis not present

## 2018-06-12 DIAGNOSIS — S88112A Complete traumatic amputation at level between knee and ankle, left lower leg, initial encounter: Secondary | ICD-10-CM | POA: Diagnosis not present

## 2018-06-14 DIAGNOSIS — Z96651 Presence of right artificial knee joint: Secondary | ICD-10-CM | POA: Diagnosis not present

## 2018-06-14 DIAGNOSIS — M25661 Stiffness of right knee, not elsewhere classified: Secondary | ICD-10-CM | POA: Diagnosis not present

## 2018-06-16 DIAGNOSIS — M25661 Stiffness of right knee, not elsewhere classified: Secondary | ICD-10-CM | POA: Diagnosis not present

## 2018-06-16 DIAGNOSIS — Z96651 Presence of right artificial knee joint: Secondary | ICD-10-CM | POA: Diagnosis not present

## 2018-06-30 DIAGNOSIS — Z96651 Presence of right artificial knee joint: Secondary | ICD-10-CM | POA: Diagnosis not present

## 2018-06-30 DIAGNOSIS — M25661 Stiffness of right knee, not elsewhere classified: Secondary | ICD-10-CM | POA: Diagnosis not present

## 2018-07-13 DIAGNOSIS — S88112A Complete traumatic amputation at level between knee and ankle, left lower leg, initial encounter: Secondary | ICD-10-CM | POA: Diagnosis not present

## 2018-07-14 DIAGNOSIS — S88112A Complete traumatic amputation at level between knee and ankle, left lower leg, initial encounter: Secondary | ICD-10-CM | POA: Diagnosis not present

## 2018-07-28 DIAGNOSIS — M25661 Stiffness of right knee, not elsewhere classified: Secondary | ICD-10-CM | POA: Diagnosis not present

## 2018-08-13 DIAGNOSIS — S88112A Complete traumatic amputation at level between knee and ankle, left lower leg, initial encounter: Secondary | ICD-10-CM | POA: Diagnosis not present

## 2018-08-29 ENCOUNTER — Encounter: Payer: Medicare HMO | Admitting: Nurse Practitioner

## 2018-09-11 DIAGNOSIS — S88112A Complete traumatic amputation at level between knee and ankle, left lower leg, initial encounter: Secondary | ICD-10-CM | POA: Diagnosis not present

## 2018-10-04 ENCOUNTER — Encounter: Payer: Medicare HMO | Admitting: Nurse Practitioner

## 2018-10-06 ENCOUNTER — Ambulatory Visit (HOSPITAL_COMMUNITY)
Admission: EM | Admit: 2018-10-06 | Discharge: 2018-10-06 | Disposition: A | Discharge status: Home / Self Care | Payer: Medicare HMO | Attending: Family Medicine | Admitting: Family Medicine

## 2018-10-06 ENCOUNTER — Encounter (HOSPITAL_COMMUNITY): Payer: Self-pay

## 2018-10-06 ENCOUNTER — Other Ambulatory Visit: Payer: Self-pay

## 2018-10-06 DIAGNOSIS — J302 Other seasonal allergic rhinitis: Secondary | ICD-10-CM | POA: Diagnosis not present

## 2018-10-06 MED ORDER — CETIRIZINE HCL 10 MG PO TABS: 10.0000 mg | ORAL_TABLET | Freq: Every day | ORAL | 0 refills | Status: DC

## 2018-10-06 MED ORDER — FLUTICASONE PROPIONATE 50 MCG/ACT NA SUSP: 1.0000 | Freq: Every day | NASAL | 2 refills | Status: DC

## 2018-10-06 NOTE — ED Triage Notes (Addendum)
Pt states he has a slight cough and it comes and goes x 1 week. He states that he has tried cough drops and that all.

## 2018-10-06 NOTE — ED Provider Notes (Signed)
[facility]    CSN: [id number] Arrival date & time: 10/06/18  1751     History   Chief Complaint Chief Complaint  Patient presents with  . Cough    HPI AWS MALLARE is a 58 y.o. male.   Pt is a 58 year old male that presents with cough, PND that has been intermittent, waxing and waning x 1 week. He has been working outside. Reports symptoms are worse when he is outside. He has been using OTC cough meds without relief.  He denies any associated fevers, chills, body aches, night sweats, hemoptysis, chest pain, shortness of breath, nasal congestion or rhinorrhea.  No recent traveling or recent sick contacts.  ROS per HPI      Past Medical History:  Diagnosis Date  . Hyperlipidemia   . Hypertension   . Osteoarthritis of knee   . Prediabetes     Patient Active Problem List   Diagnosis Date Noted  . Elevated hemoglobin A1c 01/28/2017  . Hypertension 04/08/2016  . Primary osteoarthritis of right knee 01/23/2015  . IFG (impaired fasting glucose) 04/09/2014  . Traumatic amputation of left leg below knee, sequela (HCC) 06/16/2013    Past Surgical History:  Procedure Laterality Date  . COLONOSCOPY WITH PROPOFOL N/A 08/06/2014   Procedure: COLONOSCOPY WITH PROPOFOL;  Surgeon: Charolett Bumpers, MD;  Location: [facility];  Service: Endoscopy;  Laterality: N/A;  . left leg below knee amputation   2003   in accident at work   . MOUTH SURGERY    . TOTAL KNEE ARTHROPLASTY Right 04/08/2018   Procedure: RIGHT TOTAL KNEE ARTHROPLASTY;  Surgeon: Jodi Geralds, MD;  Location: [facility];  Service: Orthopedics;  Laterality: Right;       Home Medications    Prior to Admission medications   Medication Sig Start Date End Date Taking? Authorizing Provider  amLODipine (NORVASC) 5 MG tablet Take 1 tablet (5 mg total) by mouth daily. Patient taking differently: Take 5 mg by mouth every evening.  01/04/18   Claiborne Rigg, NP  aspirin EC 325 MG tablet Take 1 tablet (325 mg total)  by mouth 2 (two) times daily after a meal. Take x 1 month post op to decrease risk of blood clots. 04/08/18   Marshia Ly, PA-C  atorvastatin (LIPITOR) 20 MG tablet Take 1 tablet (20 mg total) by mouth daily. Patient taking differently: Take 20 mg by mouth every evening.  01/04/18   Claiborne Rigg, NP  cetirizine (ZYRTEC) 10 MG tablet Take 1 tablet (10 mg total) by mouth daily. 10/06/18   Dahlia Byes A, NP  docusate sodium (COLACE) 100 MG capsule Take 1 capsule (100 mg total) by mouth 2 (two) times daily. 04/08/18   Marshia Ly, PA-C  fluticasone (FLONASE) 50 MCG/ACT nasal spray Place 1 spray into both nostrils daily. 10/06/18   Dahlia Byes A, NP  hydrochlorothiazide (HYDRODIURIL) 12.5 MG tablet Take 1 tablet (12.5 mg total) by mouth daily. Patient taking differently: Take 12.5 mg by mouth every evening.  01/04/18   Claiborne Rigg, NP  meclizine (ANTIVERT) 25 MG tablet Take 1 tablet (25 mg total) by mouth 3 (three) times daily as needed for dizziness. 05/28/18   Derwood Kaplan, MD  oxyCODONE-acetaminophen (PERCOCET/ROXICET) 5-325 MG tablet Take 1-2 tablets by mouth every 6 (six) hours as needed for severe pain. 04/08/18   Marshia Ly, PA-C  tiZANidine (ZANAFLEX) 2 MG tablet Take 1 tablet (2 mg total) by mouth every 8 (eight) hours as needed  for muscle spasms. 04/08/18   Marshia Ly, PA-C    Family History Family History  Problem Relation Age of Onset  . Diabetes Mother   . Hypertension Mother   . Cancer Mother   . Stroke Mother   . Diabetes Father   . Hypertension Father   . Diabetes Daughter   . Hypertension Daughter   . Heart disease Daughter     Social History Social History   Tobacco Use  . Smoking status: Never Smoker  . Smokeless tobacco: Never Used  Substance Use Topics  . Alcohol use: Not Currently  . Drug use: No     Allergies   Patient has no known allergies.   Review of Systems Review of Systems   Physical Exam Triage Vital Signs ED Triage Vitals  Enc  Vitals Group     BP 10/06/18 0959 126/79     Pulse Rate 10/06/18 0959 83     Resp 10/06/18 0959 18     Temp 10/06/18 0959 98.1 F (36.7 C)     Temp src --      SpO2 10/06/18 0959 98 %     Weight 10/06/18 0958 170 lb (77.1 kg)     Height --      Head Circumference --      Peak Flow --      Pain Score 10/06/18 0958 0     Pain Loc --      Pain Edu? --      Excl. in GC? --    No data found.  Updated Vital Signs BP 126/79 (BP Location: Right Arm)   Pulse 83   Temp 98.1 F (36.7 C)   Resp 18   Wt 170 lb (77.1 kg)   SpO2 98%   BMI 28.29 kg/m   Visual Acuity Right Eye Distance:   Left Eye Distance:   Bilateral Distance:    Right Eye Near:   Left Eye Near:    Bilateral Near:     Physical Exam Vitals signs and nursing note reviewed.  Constitutional:      General: He is not in acute distress.    Appearance: Normal appearance. He is not ill-appearing, toxic-appearing or diaphoretic.  HENT:     Head: Normocephalic and atraumatic.     Right Ear: There is impacted cerumen.     Left Ear: Tympanic membrane and ear canal normal.     Nose: Nose normal.     Mouth/Throat:     Comments: PND Eyes:     Conjunctiva/sclera: Conjunctivae normal.  Neck:     Musculoskeletal: Normal range of motion.  Cardiovascular:     Rate and Rhythm: Normal rate and regular rhythm.  Pulmonary:     Effort: Pulmonary effort is normal.     Breath sounds: Normal breath sounds.  Musculoskeletal: Normal range of motion.  Skin:    General: Skin is warm and dry.  Neurological:     Mental Status: He is alert.  Psychiatric:        Mood and Affect: Mood normal.      UC Treatments / Results  Labs (all labs ordered are listed, but only abnormal results are displayed) Labs Reviewed - No data to display  EKG None  Radiology No results found.  Procedures Procedures (including critical care time)  Medications Ordered in UC Medications - No data to display  Initial Impression / Assessment  and Plan / UC Course  I have reviewed the triage vital signs and the  nursing notes.  Pertinent labs & imaging results that were available during my care of the patient were reviewed by me and considered in my medical decision making (see chart for details).     Symptoms consistent with allergies We will have him do Zyrtec daily for symptoms.  Recommend he could also try Flonase for increased relief Follow up as needed for continued or worsening symptoms  Final Clinical Impressions(s) / UC Diagnoses   Final diagnoses:  Seasonal allergies     Discharge Instructions     I believe that your symptoms are related to allergies Try taking Zyrtec daily for your symptoms You could even do Flonase nasal spray Follow up as needed for continued or worsening symptoms     ED Prescriptions    Medication Sig Dispense Auth. Provider   fluticasone (FLONASE) 50 MCG/ACT nasal spray Place 1 spray into both nostrils daily. 16 g Stephanee Barcomb A, NP   cetirizine (ZYRTEC) 10 MG tablet Take 1 tablet (10 mg total) by mouth daily. 30 tablet Dahlia Byes A, NP     Controlled Substance Prescriptions [place] Controlled Substance Registry consulted? Not Applicable   Janace Aris, NP 10/06/18 1025

## 2018-10-06 NOTE — Discharge Instructions (Signed)
I believe that your symptoms are related to allergies Try taking Zyrtec daily for your symptoms You could even do Flonase nasal spray Follow up as needed for continued or worsening symptoms

## 2018-10-12 DIAGNOSIS — S88112A Complete traumatic amputation at level between knee and ankle, left lower leg, initial encounter: Secondary | ICD-10-CM | POA: Diagnosis not present

## 2018-10-25 DIAGNOSIS — M25561 Pain in right knee: Secondary | ICD-10-CM | POA: Diagnosis not present

## 2018-11-11 DIAGNOSIS — S88112A Complete traumatic amputation at level between knee and ankle, left lower leg, initial encounter: Secondary | ICD-10-CM | POA: Diagnosis not present

## 2018-11-15 ENCOUNTER — Encounter: Payer: Medicare HMO | Admitting: Nurse Practitioner

## 2018-11-25 IMAGING — DX DG CHEST 2V
2 series · 2 of 2 positions shown · non-contrast
Comparison: 04/04/2018

CLINICAL DATA: Orthostatic hypotension, dizziness

EXAM:
CHEST - 2 VIEW

[chest lat]
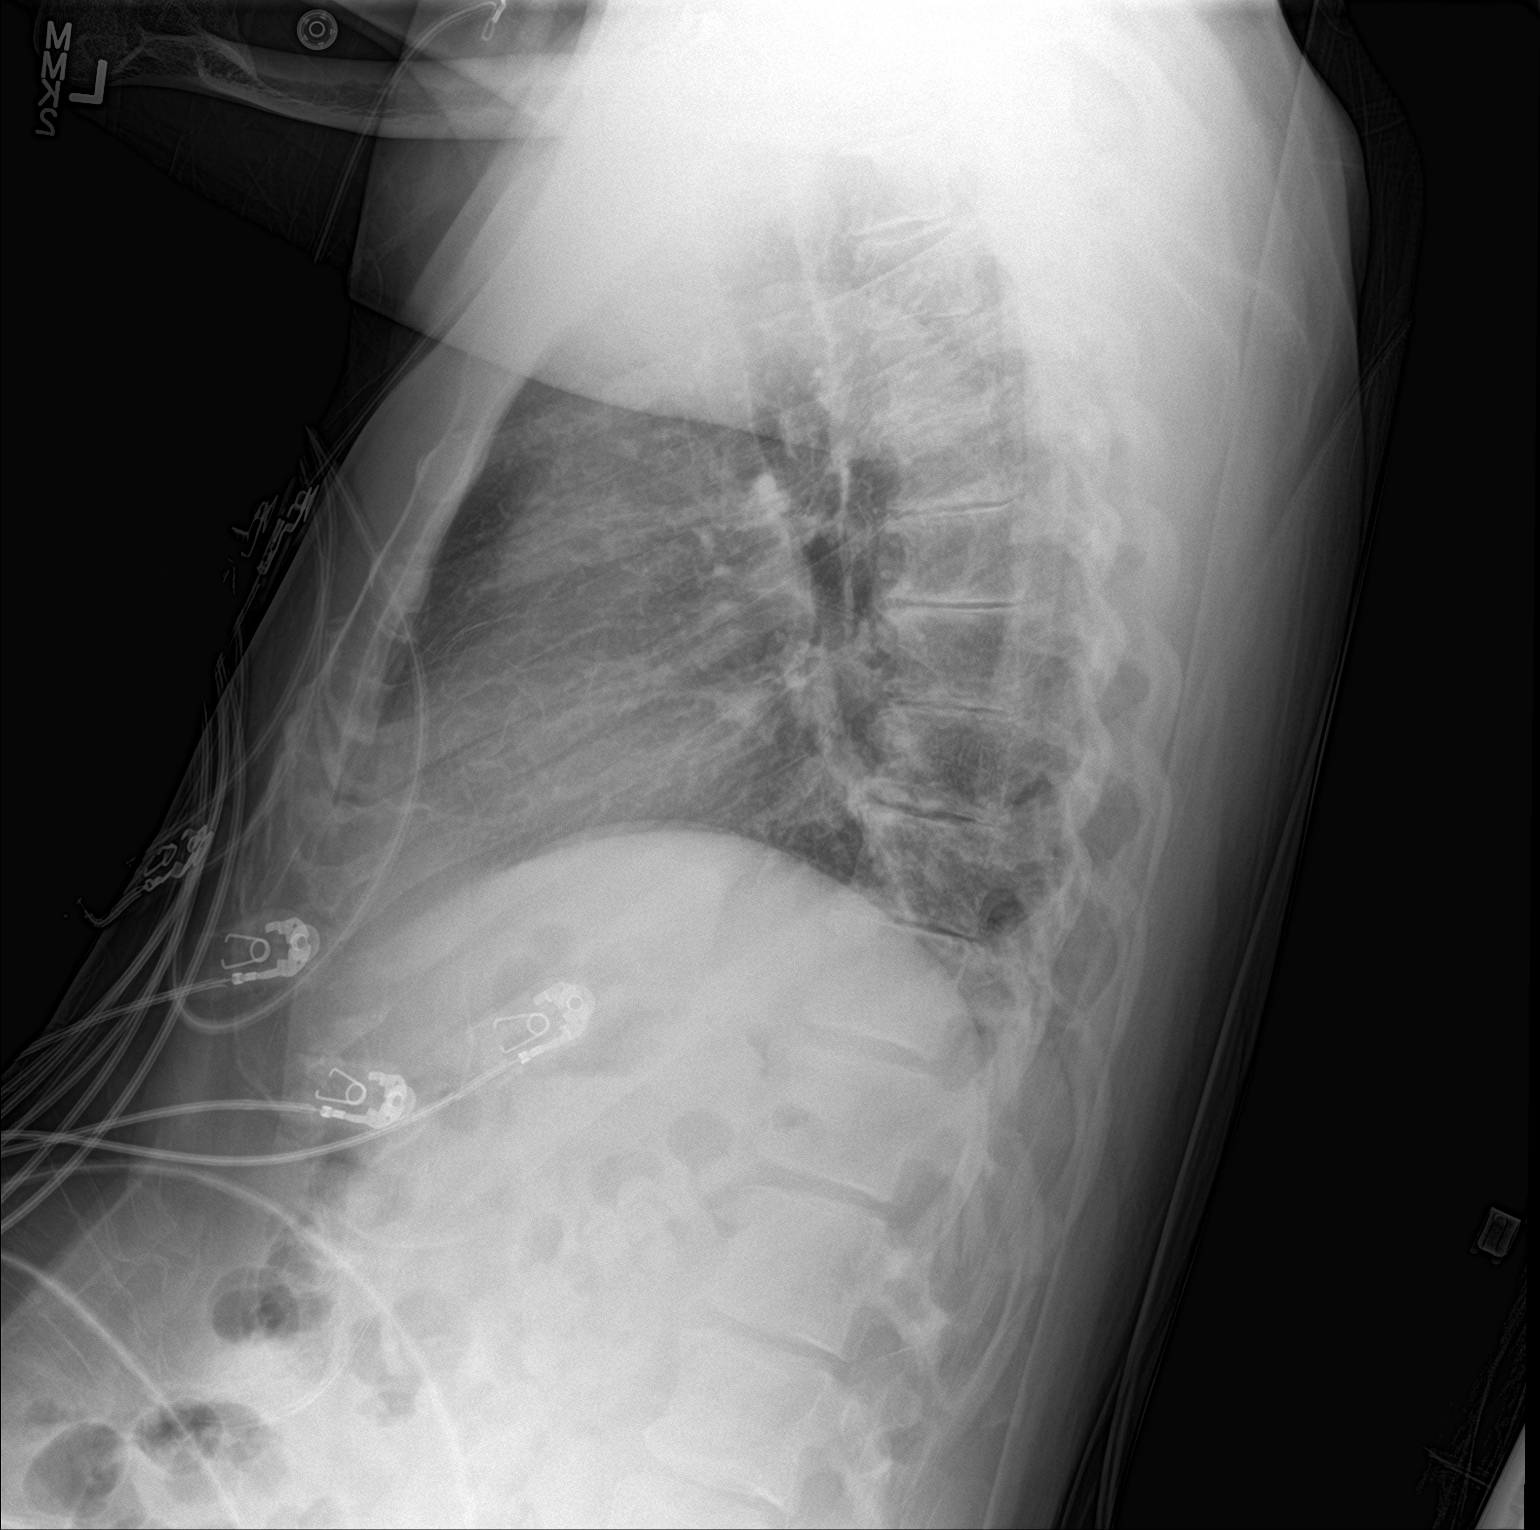

[chest ap]
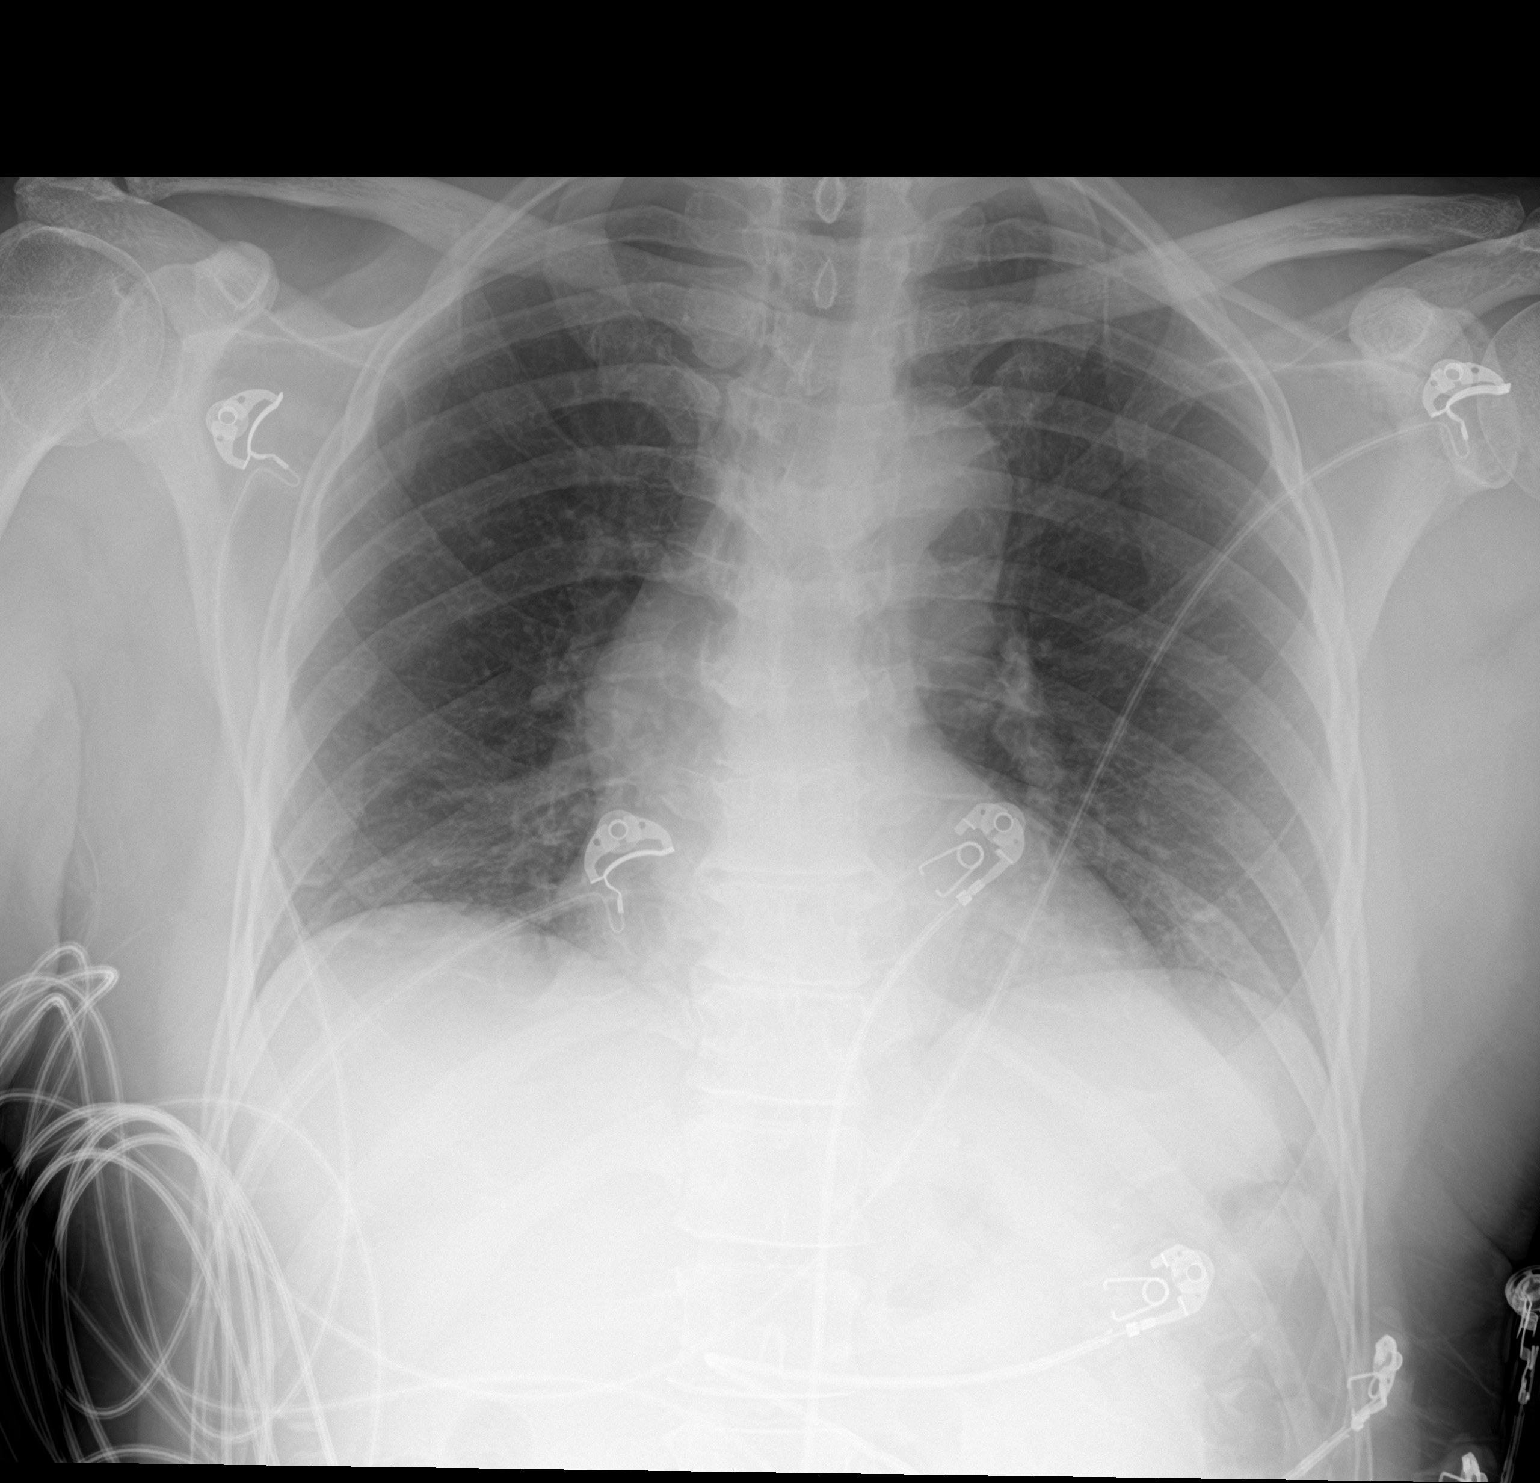

[2 of 2 positions shown; findings below may reference images not displayed]

FINDINGS: Upper normal heart size.

Elongation of thoracic aorta.

Mediastinal contours and pulmonary vascularity normal.

Bibasilar atelectasis.

Lungs otherwise clear.

No infiltrate, pleural effusion or pneumothorax.
IMPRESSION: Bibasilar atelectasis.

## 2018-12-12 DIAGNOSIS — S88112A Complete traumatic amputation at level between knee and ankle, left lower leg, initial encounter: Secondary | ICD-10-CM | POA: Diagnosis not present

## 2018-12-27 ENCOUNTER — Encounter: Payer: Medicare HMO | Admitting: Nurse Practitioner

## 2019-01-11 DIAGNOSIS — S88112A Complete traumatic amputation at level between knee and ankle, left lower leg, initial encounter: Secondary | ICD-10-CM | POA: Diagnosis not present

## 2019-02-11 DIAGNOSIS — S88112A Complete traumatic amputation at level between knee and ankle, left lower leg, initial encounter: Secondary | ICD-10-CM | POA: Diagnosis not present

## 2019-02-13 ENCOUNTER — Telehealth: Payer: Self-pay | Admitting: Nurse Practitioner

## 2019-02-13 NOTE — Telephone Encounter (Signed)
[facility] is calling because the pt is requesting some assistance with bathing and they are needing a prescription for home health. Please f/u

## 2019-02-15 NOTE — Telephone Encounter (Signed)
Bien please verify what type of HH is needed. Can you call the number listed and make sure they pay for an aide without a skilled need such as a nurse or PT in the home? I do not believe he is considered home bound if he is just requesting someone to help him bathe. Thank you

## 2019-02-21 NOTE — Telephone Encounter (Signed)
CMA called and LVM for a call back.

## 2019-02-26 ENCOUNTER — Emergency Department (HOSPITAL_COMMUNITY)
Admission: EM | Admit: 2019-02-26 | Discharge: 2019-02-27 | Disposition: A | Discharge status: Home / Self Care | Payer: Medicare HMO | Attending: Emergency Medicine | Admitting: Emergency Medicine

## 2019-02-26 ENCOUNTER — Other Ambulatory Visit: Payer: Self-pay

## 2019-02-26 ENCOUNTER — Encounter (HOSPITAL_COMMUNITY): Payer: Self-pay | Admitting: *Deleted

## 2019-02-26 DIAGNOSIS — Z5321 Procedure and treatment not carried out due to patient leaving prior to being seen by health care provider: Secondary | ICD-10-CM | POA: Diagnosis not present

## 2019-02-26 DIAGNOSIS — R109 Unspecified abdominal pain: Secondary | ICD-10-CM | POA: Insufficient documentation

## 2019-02-26 MED ORDER — SODIUM CHLORIDE 0.9% FLUSH: 3.0000 mL | Freq: Once | INTRAVENOUS | Status: DC

## 2019-02-26 NOTE — ED Triage Notes (Signed)
Left sided abdominal pain for 1.5 weeks. No n/v/d, constipation or fevers.

## 2019-02-27 ENCOUNTER — Encounter: Payer: Self-pay | Admitting: Nurse Practitioner

## 2019-02-27 ENCOUNTER — Ambulatory Visit (HOSPITAL_BASED_OUTPATIENT_CLINIC_OR_DEPARTMENT_OTHER): Payer: Medicare HMO | Admitting: Pharmacist

## 2019-02-27 ENCOUNTER — Ambulatory Visit (HOSPITAL_BASED_OUTPATIENT_CLINIC_OR_DEPARTMENT_OTHER): Payer: Medicare HMO | Admitting: Nurse Practitioner

## 2019-02-27 VITALS — BP 110/67 | HR 85 | Temp 98.5°F | Ht 65.0 in | Wt 152.0 lb

## 2019-02-27 DIAGNOSIS — E782 Mixed hyperlipidemia: Secondary | ICD-10-CM

## 2019-02-27 DIAGNOSIS — R39198 Other difficulties with micturition: Secondary | ICD-10-CM

## 2019-02-27 DIAGNOSIS — Z23 Encounter for immunization: Secondary | ICD-10-CM | POA: Diagnosis not present

## 2019-02-27 DIAGNOSIS — S88112S Complete traumatic amputation at level between knee and ankle, left lower leg, sequela: Secondary | ICD-10-CM

## 2019-02-27 DIAGNOSIS — R7303 Prediabetes: Secondary | ICD-10-CM | POA: Diagnosis not present

## 2019-02-27 DIAGNOSIS — Z89512 Acquired absence of left leg below knee: Secondary | ICD-10-CM | POA: Diagnosis not present

## 2019-02-27 DIAGNOSIS — I10 Essential (primary) hypertension: Secondary | ICD-10-CM | POA: Diagnosis not present

## 2019-02-27 LAB — POCT GLYCOSYLATED HEMOGLOBIN (HGB A1C): Hemoglobin A1C: 5.8 % — AB (ref 4.0–5.6)

## 2019-02-27 LAB — COMPREHENSIVE METABOLIC PANEL
ALT: 17 U/L (ref 0–44)
AST: 22 U/L (ref 15–41)
Albumin: 4.2 g/dL (ref 3.5–5.0)
Alkaline Phosphatase: 60 U/L (ref 38–126)
Anion gap: 11 (ref 5–15)
BUN: 12 mg/dL (ref 6–20)
CO2: 25 mmol/L (ref 22–32)
Calcium: 9.6 mg/dL (ref 8.9–10.3)
Chloride: 101 mmol/L (ref 98–111)
Creatinine, Ser: 0.96 mg/dL (ref 0.61–1.24)
GFR calc Af Amer: >60 mL/min (ref >60)
GFR calc non Af Amer: >60 mL/min (ref >60)
Glucose, Bld: 107 mg/dL — ABNORMAL HIGH (ref 70–99)
Potassium: 3.3 mmol/L — ABNORMAL LOW (ref 3.5–5.1)
Sodium: 137 mmol/L (ref 135–145)
Total Bilirubin: 0.7 mg/dL (ref 0.3–1.2)
Total Protein: 6.9 g/dL (ref 6.5–8.1)

## 2019-02-27 LAB — CBC
HCT: 39.4 % (ref 39.0–52.0)
Hemoglobin: 13.2 g/dL (ref 13.0–17.0)
MCH: 28.4 pg (ref 26.0–34.0)
MCHC: 33.5 g/dL (ref 30.0–36.0)
MCV: 84.9 fL (ref 80.0–100.0)
Platelets: 273 10*3/uL (ref 150–400)
RBC: 4.64 MIL/uL (ref 4.22–5.81)
RDW: 12.7 % (ref 11.5–15.5)
WBC: 9.1 10*3/uL (ref 4.0–10.5)
nRBC: 0 % (ref 0.0–0.2)

## 2019-02-27 LAB — GLUCOSE, POCT (MANUAL RESULT ENTRY): POC Glucose: 142 mg/dl — AB (ref 70–99)

## 2019-02-27 LAB — LIPASE, BLOOD: Lipase: 30 U/L (ref 11–51)

## 2019-02-27 MED ORDER — ATORVASTATIN CALCIUM 20 MG PO TABS: 20.0000 mg | ORAL_TABLET | Freq: Every evening | ORAL | 1 refills | Status: DC

## 2019-02-27 MED ORDER — DOCUSATE SODIUM 100 MG PO CAPS: 100.0000 mg | ORAL_CAPSULE | Freq: Two times a day (BID) | ORAL | 3 refills | Status: AC

## 2019-02-27 MED ORDER — SPIRONOLACTONE 25 MG PO TABS: 25.0000 mg | ORAL_TABLET | Freq: Every day | ORAL | 3 refills | Status: DC

## 2019-02-27 MED ORDER — AMLODIPINE BESYLATE 5 MG PO TABS: 5.0000 mg | ORAL_TABLET | Freq: Every day | ORAL | 3 refills | Status: DC

## 2019-02-27 NOTE — Progress Notes (Signed)
Assessment & Plan:  Carl Vang was seen today for follow-up.  Diagnoses and all orders for this visit:   Essential hypertension -     spironolactone (ALDACTONE) 25 MG tablet; Take 1 tablet (25 mg total) by mouth daily. -     amLODipine (NORVASC) 5 MG tablet; Take 1 tablet (5 mg total) by mouth daily. Denies chest pain, shortness of breath, palpitations, lightheadedness, dizziness, headaches or BLE edema.  BP Readings from Last 3 Encounters:  02/27/19 110/67  02/26/19 132/74  10/06/18 126/79  Continue all antihypertensives as prescribed.  Remember to bring in your blood pressure log with you for your follow up appointment.  DASH/Mediterranean Diets are healthier choices for HTN.  WELL CONTROLLED  Prediabetes WELL CONTROLLED -     Glucose (CBG) -     HgB A1c Lab Results  Component Value Date   HGBA1C 5.8 (A) 02/27/2019    Difficulty urinating -     PSA -     Urinalysis, Routine w reflex microscopic -     Microscopic Examination  Mixed hyperlipidemia -     atorvastatin (LIPITOR) 20 MG tablet; Take 1 tablet (20 mg total) by mouth every evening. -     Lipid panel INSTRUCTIONS: Work on a low fat, heart healthy diet and participate in regular aerobic exercise program by working out at least 150 minutes per week; 5 days a week-30 minutes per day. Avoid red meat, fried foods. junk foods, sodas, sugary drinks, unhealthy snacking, alcohol and smoking.  Drink at least 48oz of water per day and monitor your carbohydrate intake daily.   Traumatic amputation of left leg below knee, sequela (HCC) Chronic  Other orders -     docusate sodium (COLACE) 100 MG capsule; Take 1 capsule (100 mg total) by mouth 2 (two) times daily.    Patient has been counseled on age-appropriate routine health concerns for screening and prevention. These are reviewed and up-to-date. Referrals have been placed accordingly. Immunizations are up-to-date or declined.    Subjective:   Chief Complaint  Patient  presents with  . Follow-up    Pt. is here for a follow up for Hypertension and prediabetes.    HPI Carl Vang 58 y.o. male presents to office today for HTN and prediabetes  HIGH FALL RISK Will need PCS services ordered He has a difficult time getting in and out of the shower. L BKA and R TKA last October.  His son had been helping him with his ADLs at home including bathing and grooming however his son does not live with him anymore. He reports near falls getting into and out of the tub.   Essential Hypertension Well controlled. Taking amlodipine 5mg  daily as prescribed and spirinolactone 25 mg daily as prescribed. Denies chest pain, shortness of breath, palpitations, lightheadedness, dizziness, headaches or BLE edema. He does not monitor his blood pressure at home.  BP Readings from Last 3 Encounters:  02/27/19 110/67  02/26/19 132/74  10/06/18 126/79   Review of Systems  Constitutional: Negative for fever, malaise/fatigue and weight loss.  HENT: Negative.  Negative for nosebleeds.   Eyes: Negative.  Negative for blurred vision, double vision and photophobia.  Respiratory: Negative.  Negative for cough and shortness of breath.   Cardiovascular: Negative.  Negative for chest pain, palpitations and leg swelling.  Gastrointestinal: Positive for constipation. Negative for heartburn, nausea and vomiting.  Genitourinary: Positive for frequency. Negative for dysuria, hematuria and urgency.       Hesistancy  Musculoskeletal:  Negative.  Negative for myalgias.  Skin: Negative.   Neurological: Negative.  Negative for dizziness, focal weakness, seizures and headaches.  Endo/Heme/Allergies: Negative.   Psychiatric/Behavioral: Negative.  Negative for suicidal ideas.    Past Medical History:  Diagnosis Date  . Hyperlipidemia   . Hypertension   . Osteoarthritis of knee   . Prediabetes     Past Surgical History:  Procedure Laterality Date  . COLONOSCOPY WITH PROPOFOL N/A 08/06/2014    Procedure: COLONOSCOPY WITH PROPOFOL;  Surgeon: Charolett BumpersMartin K Johnson, MD;  Location: [facility];  Service: Endoscopy;  Laterality: N/A;  . left leg below knee amputation   2003   in accident at work   . MOUTH SURGERY    . TOTAL KNEE ARTHROPLASTY Right 04/08/2018   Procedure: RIGHT TOTAL KNEE ARTHROPLASTY;  Surgeon: Jodi GeraldsGraves, John, MD;  Location: [facility];  Service: Orthopedics;  Laterality: Right;    Family History  Problem Relation Age of Onset  . Diabetes Mother   . Hypertension Mother   . Cancer Mother   . Stroke Mother   . Diabetes Father   . Hypertension Father   . Diabetes Daughter   . Hypertension Daughter   . Heart disease Daughter     Social History Reviewed with no changes to be made today.   Outpatient Medications Prior to Visit  Medication Sig Dispense Refill  . fluticasone (FLONASE) 50 MCG/ACT nasal spray Place 1 spray into both nostrils daily. 16 g 2  . amLODipine (NORVASC) 5 MG tablet Take 1 tablet (5 mg total) by mouth daily. (Patient taking differently: Take 5 mg by mouth every evening. ) 90 tablet 3  . atorvastatin (LIPITOR) 20 MG tablet Take 1 tablet (20 mg total) by mouth daily. (Patient taking differently: Take 20 mg by mouth every evening. ) 90 tablet 3  . hydrochlorothiazide (HYDRODIURIL) 12.5 MG tablet Take 1 tablet (12.5 mg total) by mouth daily. (Patient taking differently: Take 12.5 mg by mouth every evening. ) 90 tablet 3  . aspirin EC 325 MG tablet Take 1 tablet (325 mg total) by mouth 2 (two) times daily after a meal. Take x 1 month post op to decrease risk of blood clots. (Patient not taking: Reported on 02/27/2019) 60 tablet 0  . cetirizine (ZYRTEC) 10 MG tablet Take 1 tablet (10 mg total) by mouth daily. (Patient not taking: Reported on 02/27/2019) 30 tablet 0  . docusate sodium (COLACE) 100 MG capsule Take 1 capsule (100 mg total) by mouth 2 (two) times daily. (Patient not taking: Reported on 02/27/2019) 30 capsule 0  . meclizine (ANTIVERT) 25 MG tablet Take 1  tablet (25 mg total) by mouth 3 (three) times daily as needed for dizziness. (Patient not taking: Reported on 02/27/2019) 30 tablet 0  . oxyCODONE-acetaminophen (PERCOCET/ROXICET) 5-325 MG tablet Take 1-2 tablets by mouth every 6 (six) hours as needed for severe pain. (Patient not taking: Reported on 02/27/2019) 40 tablet 0  . tiZANidine (ZANAFLEX) 2 MG tablet Take 1 tablet (2 mg total) by mouth every 8 (eight) hours as needed for muscle spasms. (Patient not taking: Reported on 02/27/2019) 50 tablet 0  . sodium chloride flush (NS) 0.9 % injection 3 mL      No facility-administered medications prior to visit.     No Known Allergies     Objective:    BP 110/67 (BP Location: Left Arm, Patient Position: Sitting, Cuff Size: Normal)   Pulse 85   Temp 98.5 F (36.9 C) (Oral)   Ht 5'  5" (1.651 m)   Wt 152 lb (68.9 kg)   SpO2 95%   BMI 25.29 kg/m  Wt Readings from Last 3 Encounters:  02/27/19 152 lb (68.9 kg)  10/06/18 170 lb (77.1 kg)  05/28/18 165 lb (74.8 kg)    Physical Exam Vitals signs and nursing note reviewed.  Constitutional:      Appearance: He is well-developed.  HENT:     Head: Normocephalic and atraumatic.  Neck:     Musculoskeletal: Normal range of motion.  Cardiovascular:     Rate and Rhythm: Normal rate and regular rhythm.     Heart sounds: Normal heart sounds. No murmur. No friction rub. No gallop.   Pulmonary:     Effort: Pulmonary effort is normal. No tachypnea or respiratory distress.     Breath sounds: Normal breath sounds. No decreased breath sounds, wheezing, rhonchi or rales.  Chest:     Chest wall: No tenderness.  Abdominal:     General: Bowel sounds are normal.     Palpations: Abdomen is soft.  Musculoskeletal: Normal range of motion.  Skin:    General: Skin is warm and dry.  Neurological:     Mental Status: He is alert and oriented to person, place, and time.     Coordination: Coordination normal.  Psychiatric:        Behavior: Behavior normal.  Behavior is cooperative.        Thought Content: Thought content normal.        Judgment: Judgment normal.          Patient has been counseled extensively about nutrition and exercise as well as the importance of adherence with medications and regular follow-up. The patient was given clear instructions to go to ER or return to medical center if symptoms don't improve, worsen or new problems develop. The patient verbalized understanding.   Follow-up: Return in about 2 weeks (around 03/13/2019) for BP recheck and bmp and 3 months physical.   Claiborne RiggZelda W Jaydan Chretien, FNP-BC [facility] [place], [place] [phone]   03/05/2019, 7:56 PM

## 2019-02-27 NOTE — ED Notes (Signed)
No answer in WR x1

## 2019-02-27 NOTE — Progress Notes (Signed)
Pt presents for influenza vaccine. Consent given; counseling provided. No contraindications exist. Vaccine administered.

## 2019-02-27 NOTE — ED Notes (Signed)
Pt. Not answering for room assignment. Appears to have left, pulling OTF.

## 2019-02-28 LAB — URINALYSIS, ROUTINE W REFLEX MICROSCOPIC
Bilirubin, UA: NEGATIVE
Glucose, UA: NEGATIVE
Ketones, UA: NEGATIVE
Nitrite, UA: NEGATIVE
Protein,UA: NEGATIVE
RBC, UA: NEGATIVE
Specific Gravity, UA: 1.022 (ref 1.005–1.030)
Urobilinogen, Ur: 1 mg/dL (ref 0.2–1.0)
pH, UA: 5.5 (ref 5.0–7.5)

## 2019-02-28 LAB — LIPID PANEL
Chol/HDL Ratio: 3.3 ratio (ref 0.0–5.0)
Cholesterol, Total: 199 mg/dL (ref 100–199)
HDL: 61 mg/dL (ref >39)
LDL Calculated: 124 mg/dL — ABNORMAL HIGH (ref 0–99)
Triglycerides: 69 mg/dL (ref 0–149)
VLDL Cholesterol Cal: 14 mg/dL (ref 5–40)

## 2019-02-28 LAB — PSA: Prostate Specific Ag, Serum: 3.3 ng/mL (ref 0.0–4.0)

## 2019-02-28 LAB — MICROSCOPIC EXAMINATION: Casts: NONE SEEN /lpf

## 2019-03-02 ENCOUNTER — Other Ambulatory Visit: Payer: Self-pay | Admitting: Nurse Practitioner

## 2019-03-02 ENCOUNTER — Other Ambulatory Visit: Payer: Self-pay | Admitting: *Deleted

## 2019-03-02 ENCOUNTER — Other Ambulatory Visit (HOSPITAL_COMMUNITY)
Admission: RE | Admit: 2019-03-02 | Discharge: 2019-03-02 | Disposition: A | Discharge status: Home / Self Care | Payer: Medicare HMO | Source: Ambulatory Visit | Attending: Nurse Practitioner | Admitting: Nurse Practitioner

## 2019-03-02 DIAGNOSIS — N390 Urinary tract infection, site not specified: Secondary | ICD-10-CM | POA: Diagnosis not present

## 2019-03-02 DIAGNOSIS — R8281 Pyuria: Secondary | ICD-10-CM | POA: Insufficient documentation

## 2019-03-02 MED ORDER — NITROFURANTOIN MONOHYD MACRO 100 MG PO CAPS: 100.0000 mg | ORAL_CAPSULE | Freq: Two times a day (BID) | ORAL | 0 refills | Status: AC

## 2019-03-04 LAB — URINE CULTURE

## 2019-03-05 ENCOUNTER — Encounter: Payer: Self-pay | Admitting: Nurse Practitioner

## 2019-03-06 ENCOUNTER — Other Ambulatory Visit: Payer: Self-pay | Admitting: Nurse Practitioner

## 2019-03-06 LAB — URINE CYTOLOGY ANCILLARY ONLY
Bacterial vaginitis: POSITIVE — AB
Candida vaginitis: NEGATIVE

## 2019-03-06 MED ORDER — METRONIDAZOLE 500 MG PO TABS: 500.0000 mg | ORAL_TABLET | Freq: Two times a day (BID) | ORAL | 0 refills | Status: AC

## 2019-03-14 ENCOUNTER — Ambulatory Visit: Payer: Medicare HMO | Admitting: Pharmacist

## 2019-03-14 DIAGNOSIS — S88112A Complete traumatic amputation at level between knee and ankle, left lower leg, initial encounter: Secondary | ICD-10-CM | POA: Diagnosis not present

## 2019-03-14 NOTE — Progress Notes (Deleted)
   S:    PCP: Zelda   Patient arrives ***.    Presents to the clinic for hypertension evaluation, counseling, and management.  Patient was referred and last seen by Primary Care Provider on 02/27/19. BP at that visit - 110/67.   Patient {Actions; denies-reports:120008} adherence with medications.  Current BP Medications include:  Amlodipine 5 mg daily, spironolactone 25 mg daily   Antihypertensives tried in the past include: HCTZ (hypokalemia)   Dietary habits include: *** Exercise habits include:*** Family / Social history: ***  ASCVD risk factors include:***  O:  Physical Exam   ROS  Home BP readings: ***  Last 3 Office BP readings: BP Readings from Last 3 Encounters:  02/27/19 110/67  02/26/19 132/74  10/06/18 126/79    BMET    Component Value Date/Time   NA 137 02/26/2019 2355   NA 139 01/04/2018 1120   K 3.3 (L) 02/26/2019 2355   CL 101 02/26/2019 2355   CO2 25 02/26/2019 2355   GLUCOSE 107 (H) 02/26/2019 2355   BUN 12 02/26/2019 2355   BUN 11 01/04/2018 1120   CREATININE 0.96 02/26/2019 2355   CREATININE 0.74 04/07/2016 1643   CALCIUM 9.6 02/26/2019 2355   GFRNONAA >60 02/26/2019 2355   GFRNONAA >89 04/07/2016 1643   GFRAA >60 02/26/2019 2355   GFRAA >89 04/07/2016 1643    Renal function: CrCl cannot be calculated (Unknown ideal weight.).  Clinical ASCVD: {YES/NO:21197} The 10-year ASCVD risk score Mikey Bussing DC Jr., et al., 2013) is: 8.5%   Values used to calculate the score:     Age: 58 years     Sex: Male     Is Non-Hispanic African American: Yes     Diabetic: No     Tobacco smoker: No     Systolic Blood Pressure: 811 mmHg     Is BP treated: Yes     HDL Cholesterol: 61 mg/dL     Total Cholesterol: 199 mg/dL   A/P: Hypertension longstanding/newly diagnosed currently *** on current medications. BP Goal = *** mmHg. Patient {Is/is not:9024} adherent with current medications.  -{Meds adjust:18428} ***.  -F/u labs ordered - *** -Counseled on  lifestyle modifications for blood pressure control including reduced dietary sodium, increased exercise, adequate sleep  Results reviewed and written information provided.   Total time in face-to-face counseling *** minutes.   F/U Clinic Visit in ***.  Patient seen with ***

## 2019-03-18 LAB — URINE CYTOLOGY ANCILLARY ONLY
Chlamydia: NEGATIVE
Neisseria Gonorrhea: NEGATIVE
Trichomonas: NEGATIVE

## 2019-03-26 DIAGNOSIS — R0902 Hypoxemia: Secondary | ICD-10-CM | POA: Diagnosis not present

## 2019-03-26 DIAGNOSIS — R1111 Vomiting without nausea: Secondary | ICD-10-CM | POA: Diagnosis not present

## 2019-03-26 DIAGNOSIS — I959 Hypotension, unspecified: Secondary | ICD-10-CM | POA: Diagnosis not present

## 2019-03-26 DIAGNOSIS — R000 Tachycardia, unspecified: Secondary | ICD-10-CM | POA: Diagnosis not present

## 2019-03-26 DIAGNOSIS — R404 Transient alteration of awareness: Secondary | ICD-10-CM | POA: Diagnosis not present

## 2019-04-07 ENCOUNTER — Other Ambulatory Visit: Payer: Self-pay

## 2019-04-07 ENCOUNTER — Ambulatory Visit: Payer: Medicare HMO | Attending: Family Medicine | Admitting: Family Medicine

## 2019-04-07 ENCOUNTER — Encounter: Payer: Self-pay | Admitting: Family Medicine

## 2019-04-07 VITALS — BP 100/66 | HR 75 | Temp 98.0°F | Ht 65.0 in | Wt 155.0 lb

## 2019-04-07 DIAGNOSIS — R1032 Left lower quadrant pain: Secondary | ICD-10-CM

## 2019-04-07 NOTE — Progress Notes (Signed)
Established Patient Office Visit  Subjective:  Patient ID: Carl Vang, male    DOB: [birth year]  Age: 58 y.o. MRN: [id number]  CC:  Chief Complaint  Patient presents with  . Groin Pain    HPI [name], 58 year old African-American male, who presents secondary to complaint of feeling a nodule in his left groin area for the past month/4 weeks and he states that the area gradually increased in size.  Area has not been tender to touch.  Patient does have a dull, aching discomfort in this area when he lies down at night.  He denies fever, chills or night sweats, no change in appetite.  No difficulty with urination, no pain in the scrotum/testes.  No penile discharge or genital area lesions.  Past Medical History:  Diagnosis Date  . Hyperlipidemia   . Hypertension   . Osteoarthritis of knee   . Prediabetes     Past Surgical History:  Procedure Laterality Date  . COLONOSCOPY WITH PROPOFOL N/A 08/06/2014   Procedure: COLONOSCOPY WITH PROPOFOL;  Surgeon: Charolett Bumpers, MD;  Location: [facility];  Service: Endoscopy;  Laterality: N/A;  . left leg below knee amputation   2003   in accident at work   . MOUTH SURGERY    . TOTAL KNEE ARTHROPLASTY Right 04/08/2018   Procedure: RIGHT TOTAL KNEE ARTHROPLASTY;  Surgeon: Jodi Geralds, MD;  Location: [facility];  Service: Orthopedics;  Laterality: Right;    Family History  Problem Relation Age of Onset  . Diabetes Mother   . Hypertension Mother   . Cancer Mother   . Stroke Mother   . Diabetes Father   . Hypertension Father   . Diabetes Daughter   . Hypertension Daughter   . Heart disease Daughter     Social History   Socioeconomic History  . Marital status: Single    Spouse name: Not on file  . Number of children: Not on file  . Years of education: Not on file  . Highest education level: Not on file  Occupational History  . Not on file  Social Needs  . Financial resource strain: Not on file  . Food insecurity    Worry:  Not on file    Inability: Not on file  . Transportation needs    Medical: Not on file    Non-medical: Not on file  Tobacco Use  . Smoking status: Never Smoker  . Smokeless tobacco: Never Used  Substance and Sexual Activity  . Alcohol use: Not Currently  . Drug use: No  . Sexual activity: Yes  Lifestyle  . Physical activity    Days per week: Not on file    Minutes per session: Not on file  . Stress: Not on file  Relationships  . Social Musician on phone: Not on file    Gets together: Not on file    Attends religious service: Not on file    Active member of club or organization: Not on file    Attends meetings of clubs or organizations: Not on file    Relationship status: Not on file  . Intimate partner violence    Fear of current or ex partner: Not on file    Emotionally abused: Not on file    Physically abused: Not on file    Forced sexual activity: Not on file  Other Topics Concern  . Not on file  Social History Narrative  . Not on file  Outpatient Medications Prior to Visit  Medication Sig Dispense Refill  . amLODipine (NORVASC) 5 MG tablet Take 1 tablet (5 mg total) by mouth daily. 90 tablet 3  . atorvastatin (LIPITOR) 20 MG tablet Take 1 tablet (20 mg total) by mouth every evening. 90 tablet 1  . fluticasone (FLONASE) 50 MCG/ACT nasal spray Place 1 spray into both nostrils daily. 16 g 2  . spironolactone (ALDACTONE) 25 MG tablet Take 1 tablet (25 mg total) by mouth daily. 90 tablet 3   No facility-administered medications prior to visit.     No Known Allergies  ROS Review of Systems  Constitutional: Negative for chills and fever.  Respiratory: Negative for cough and shortness of breath.   Cardiovascular: Negative for chest pain and palpitations.  Gastrointestinal: Negative for abdominal pain, constipation, diarrhea and nausea.  Endocrine: Negative for polydipsia, polyphagia and polyuria.  Genitourinary: Negative for difficulty urinating,  discharge, dysuria, genital sores, penile pain, penile swelling, scrotal swelling and testicular pain.  Neurological: Negative for dizziness and headaches.  Hematological: Negative for adenopathy. Does not bruise/bleed easily.      Objective:    Physical Exam  Constitutional: He is oriented to person, place, and time. He appears well-developed and well-nourished.  Well-nourished well-developed male in no acute distress sitting in exam chair.  Patient is wearing face mask as per office COVID-19 precautions.  Patient tended to mumble or have slightly dysarthric speech which made him somewhat difficult to understand at times  Cardiovascular: Normal rate and regular rhythm.  Pulmonary/Chest: Effort normal and breath sounds normal.  Abdominal: Soft. There is no abdominal tenderness. There is no rebound and no guarding.  Genitourinary:    Genitourinary Comments: Patient palpated the area in the left groin where he had previously felt a nodule and patient could not find the nodule that he had previously felt.  I also palpated this area and could not find an appreciable nodule in the groin and  pubis in the area where patient indicated prior nodule.  Patient did have tenderness within the left inguinal canal but failed to give good effort at cough or Valsalva and no actual hernia was appreciated on palpation.   Neurological: He is alert and oriented to person, place, and time.  Nursing note and vitals reviewed.   BP 100/66   Pulse 75   Temp 98 F (36.7 C) (Oral)   Ht 5\' 5"  (1.651 m)   Wt 155 lb (70.3 kg)   SpO2 96%   BMI 25.79 kg/m  Wt Readings from Last 3 Encounters:  04/07/19 155 lb (70.3 kg)  02/27/19 152 lb (68.9 kg)  10/06/18 170 lb (77.1 kg)     No results found for: TSH Lab Results  Component Value Date   WBC 9.1 02/26/2019   HGB 13.2 02/26/2019   HCT 39.4 02/26/2019   MCV 84.9 02/26/2019   PLT 273 02/26/2019   Lab Results  Component Value Date   NA 137 02/26/2019   K  3.3 (L) 02/26/2019   CO2 25 02/26/2019   GLUCOSE 107 (H) 02/26/2019   BUN 12 02/26/2019   CREATININE 0.96 02/26/2019   BILITOT 0.7 02/26/2019   ALKPHOS 60 02/26/2019   AST 22 02/26/2019   ALT 17 02/26/2019   PROT 6.9 02/26/2019   ALBUMIN 4.2 02/26/2019   CALCIUM 9.6 02/26/2019   ANIONGAP 11 02/26/2019   Lab Results  Component Value Date   CHOL 199 02/27/2019   Lab Results  Component Value Date   HDL  61 02/27/2019   Lab Results  Component Value Date   LDLCALC 124 (H) 02/27/2019   Lab Results  Component Value Date   TRIG 69 02/27/2019   Lab Results  Component Value Date   CHOLHDL 3.3 02/27/2019   Lab Results  Component Value Date   HGBA1C 5.8 (A) 02/27/2019      Assessment & Plan:  1. Pain in the groin, left On today's exam, patient states that he cannot find the previously enlarged nodule in the left groin area.  He does have some tenderness with palpation within the inguinal canal.  Unfortunately he failed to give a good effort with attempts at cough or Valsalva.  Patient reports that he has had pain in this area for the past month.  Patient will be referred to general surgery for evaluation of possible inguinal hernia and information on inguinal hernias given as part of after visit summary-AVS. - Ambulatory referral to General Surgery  -Influenza immunization offered but patient states that he has already received his influenza immunization.  An After Visit Summary was printed and given to the patient.  Follow-up: Return for referral placed to surgery; keep f/u appt with PCP.   Antony Blackbird, MD

## 2019-04-07 NOTE — Patient Instructions (Signed)
Inguinal Hernia, Adult An inguinal hernia is when fat or your intestines push through a weak spot in a muscle where your leg meets your lower belly (groin). This causes a rounded lump (bulge). This kind of hernia could also be:  In your scrotum, if you are male.  In folds of skin around your vagina, if you are male. There are three types of inguinal hernias. These include:  Hernias that can be pushed back into the belly (are reducible). This type rarely causes pain.  Hernias that cannot be pushed back into the belly (are incarcerated).  Hernias that cannot be pushed back into the belly and lose their blood supply (are strangulated). This type needs emergency surgery. If you do not have symptoms, you may not need treatment. If you have symptoms or a large hernia, you may need surgery. Follow these instructions at home: Lifestyle  Do these things if told by your doctor so you do not have trouble pooping (constipation): ? Drink enough fluid to keep your pee (urine) pale yellow. ? Eat foods that have a lot of fiber. These include fresh fruits and vegetables, whole grains, and beans. ? Limit foods that are high in fat and processed sugars. These include foods that are fried or sweet. ? Take medicine for trouble pooping.  Avoid lifting heavy objects.  Avoid standing for long amounts of time.  Do not use any products that contain nicotine or tobacco. These include cigarettes and e-cigarettes. If you need help quitting, ask your doctor.  Stay at a healthy weight. General instructions  You may try to push your hernia in by very gently pressing on it when you are lying down. Do not try to force the bulge back in if it will not push in easily.  Watch your hernia for any changes in shape, size, or color. Tell your doctor if you see any changes.  Take over-the-counter and prescription medicines only as told by your doctor.  Keep all follow-up visits as told by your doctor. This is  important. Contact a doctor if:  You have a fever.  You have new symptoms.  Your symptoms get worse. Get help right away if:  The area where your leg meets your lower belly has: ? Pain that gets worse suddenly. ? A bulge that gets bigger suddenly, and it does not get smaller after that. ? A bulge that turns red or purple. ? A bulge that is painful when you touch it.  You are a man, and your scrotum: ? Suddenly feels painful. ? Suddenly changes in size.  You cannot push the hernia in by very gently pressing on it when you are lying down. Do not try to force the bulge back in if it will not push in easily.  You feel sick to your stomach (nauseous), and that feeling does not go away.  You throw up (vomit), and that keeps happening.  You have a fast heartbeat.  You cannot poop (have a bowel movement) or pass gas. These symptoms may be an emergency. Do not wait to see if the symptoms will go away. Get medical help right away. Call your local emergency services (911 in the [place] Summary  An inguinal hernia is when fat or your intestines push through a weak spot in a muscle where your leg meets your lower belly (groin). This causes a rounded lump (bulge).  If you do not have symptoms, you may not need treatment. If you have symptoms or a large hernia, you   may need surgery.  Avoid lifting heavy objects. Also avoid standing for long amounts of time.  Do not try to force the bulge back in if it will not push in easily. This information is not intended to replace advice given to you by your health care provider. Make sure you discuss any questions you have with your health care provider. Document Released: 07/23/2006 Document Revised: 07/24/2017 Document Reviewed: 03/24/2017 Elsevier Patient Education  2020 Elsevier Inc.  

## 2019-04-07 NOTE — Progress Notes (Signed)
Patient states that he has a knot in his groin area.

## 2019-04-13 DIAGNOSIS — S88112A Complete traumatic amputation at level between knee and ankle, left lower leg, initial encounter: Secondary | ICD-10-CM | POA: Diagnosis not present

## 2019-04-18 ENCOUNTER — Telehealth: Payer: Self-pay | Admitting: Nurse Practitioner

## 2019-04-18 NOTE — Telephone Encounter (Signed)
New Message   Pt states he is having pain with possible hernia and would like to only speak to a nurse

## 2019-04-20 NOTE — Telephone Encounter (Signed)
Attempt to reach patient. No answer and LVM for a call back.  Pt. Was referred on 10/02 to [facility] for hernia.

## 2019-05-03 DIAGNOSIS — K4090 Unilateral inguinal hernia, without obstruction or gangrene, not specified as recurrent: Secondary | ICD-10-CM | POA: Diagnosis not present

## 2019-05-30 ENCOUNTER — Ambulatory Visit: Payer: Medicare HMO | Admitting: Nurse Practitioner

## 2019-11-23 ENCOUNTER — Telehealth: Payer: Self-pay | Admitting: Nurse Practitioner

## 2019-11-23 NOTE — Telephone Encounter (Signed)
Patient called in and requested for a new script for his Bed assist pull up bar/chains. Patient stated that his broke and was informed to contact pcp for a new script. Please fax script over to [facility] [phone].

## 2019-11-24 NOTE — Telephone Encounter (Signed)
Will route to PCP for the script request.

## 2019-11-25 NOTE — Telephone Encounter (Signed)
Please have Carl Vang assist with this. Not clear what he needs.

## 2019-11-30 NOTE — Telephone Encounter (Signed)
Attempt to reach patient regarding the script he is asking for, is it bed trapeze, and more description of it. No answer and LVM for call back on both home and mobile number.

## 2020-04-09 ENCOUNTER — Other Ambulatory Visit: Payer: Self-pay

## 2020-04-09 ENCOUNTER — Emergency Department (HOSPITAL_COMMUNITY)
Admission: EM | Admit: 2020-04-09 | Discharge: 2020-04-09 | Disposition: A | Discharge status: Home / Self Care | Payer: Medicare HMO | Attending: Emergency Medicine | Admitting: Emergency Medicine

## 2020-04-09 ENCOUNTER — Encounter (HOSPITAL_COMMUNITY): Payer: Self-pay | Admitting: Emergency Medicine

## 2020-04-09 DIAGNOSIS — Z20822 Contact with and (suspected) exposure to covid-19: Secondary | ICD-10-CM | POA: Insufficient documentation

## 2020-04-09 DIAGNOSIS — Z5321 Procedure and treatment not carried out due to patient leaving prior to being seen by health care provider: Secondary | ICD-10-CM | POA: Diagnosis not present

## 2020-04-09 DIAGNOSIS — R0981 Nasal congestion: Secondary | ICD-10-CM | POA: Diagnosis not present

## 2020-04-09 NOTE — ED Triage Notes (Signed)
Family member has covid, wants to be tested

## 2020-04-09 NOTE — ED Notes (Signed)
Pt reports he is leaving and coming back in the morning.

## 2020-04-10 ENCOUNTER — Emergency Department (HOSPITAL_COMMUNITY): Payer: Medicare HMO

## 2020-04-10 ENCOUNTER — Emergency Department (HOSPITAL_COMMUNITY)
Admission: EM | Admit: 2020-04-10 | Discharge: 2020-04-10 | Disposition: A | Discharge status: Home / Self Care | Payer: Medicare HMO | Attending: Emergency Medicine | Admitting: Emergency Medicine

## 2020-04-10 ENCOUNTER — Other Ambulatory Visit: Payer: Self-pay

## 2020-04-10 ENCOUNTER — Encounter (HOSPITAL_COMMUNITY): Payer: Self-pay

## 2020-04-10 DIAGNOSIS — Z5321 Procedure and treatment not carried out due to patient leaving prior to being seen by health care provider: Secondary | ICD-10-CM | POA: Diagnosis not present

## 2020-04-10 DIAGNOSIS — R059 Cough, unspecified: Secondary | ICD-10-CM | POA: Insufficient documentation

## 2020-04-10 NOTE — ED Notes (Signed)
Patient was called to reassess vital signs but no response. x1 

## 2020-04-10 NOTE — ED Triage Notes (Signed)
Pt arrived via walk in,c /o cough, family member tested positive for COVID 9/29. NAD

## 2020-04-12 ENCOUNTER — Other Ambulatory Visit: Payer: Medicare HMO

## 2020-04-15 ENCOUNTER — Other Ambulatory Visit: Payer: Medicare HMO

## 2020-11-25 ENCOUNTER — Ambulatory Visit: Payer: Self-pay | Admitting: *Deleted

## 2020-11-25 NOTE — Telephone Encounter (Signed)
Patient is calling to report he has had vomiting, constipation and abdominal pain for 1 month. Patient states symptoms come and go. Patient is requesting to be seen- advised no in office appointment within disposition- but options are UC/mobile unit. Patient wants to go to [facility] unit- information given.  Reason for Disposition . Vomiting is a chronic symptom (recurrent or ongoing AND present > 4 weeks)  Answer Assessment - Initial Assessment Questions 1. VOMITING SEVERITY: "How many times have you vomited in the past 24 hours?"     - MILD:  1 - 2 times/day    - MODERATE: 3 - 5 times/day, decreased oral intake without significant weight loss or symptoms of dehydration    - SEVERE: 6 or more times/day, vomits everything or nearly everything, with significant weight loss, symptoms of dehydration      Off/on- patient will have vomiting spells mild/moderate- comes and goes 2. ONSET: "When did the vomiting begin?"     1 1/2 months 3. FLUIDS: "What fluids or food have you vomited up today?" "Have you been able to keep any fluids down?"     No vomiting today 4. ABDOMINAL PAIN: "Are your having any abdominal pain?" If yes : "How bad is it and what does it feel like?" (e.g., crampy, dull, intermittent, constant)      Discomfort- throbbing pain, midsection pain- at navel  5. DIARRHEA: "Is there any diarrhea?" If Yes, ask: "How many times today?"      More constipation- taking Pepto, BM yesterday 6. CONTACTS: "Is there anyone else in the family with the same symptoms?"     no 7. CAUSE: "What do you think is causing your vomiting?"     unsure 8. HYDRATION STATUS: "Any signs of dehydration?" (e.g., dry mouth [not only dry lips], too weak to stand) "When did you last urinate?"     No signs of dehydration 9. OTHER SYMPTOMS: "Do you have any other symptoms?" (e.g., fever, headache, vertigo, vomiting blood or coffee grounds, recent head injury)     no 10. PREGNANCY: "Is there any chance you are pregnant?"  "When was your last menstrual period?"       n/a  Protocols used: [facility]

## 2020-12-05 ENCOUNTER — Other Ambulatory Visit: Payer: Self-pay

## 2020-12-05 ENCOUNTER — Ambulatory Visit (HOSPITAL_COMMUNITY)
Admission: EM | Admit: 2020-12-05 | Discharge: 2020-12-05 | Disposition: A | Discharge status: Home / Self Care | Payer: Medicare HMO | Attending: Emergency Medicine | Admitting: Emergency Medicine

## 2020-12-05 ENCOUNTER — Encounter (HOSPITAL_COMMUNITY): Payer: Self-pay | Admitting: Emergency Medicine

## 2020-12-05 DIAGNOSIS — M1711 Unilateral primary osteoarthritis, right knee: Secondary | ICD-10-CM

## 2020-12-05 DIAGNOSIS — L03116 Cellulitis of left lower limb: Secondary | ICD-10-CM | POA: Diagnosis not present

## 2020-12-05 DIAGNOSIS — L03115 Cellulitis of right lower limb: Secondary | ICD-10-CM

## 2020-12-05 MED ORDER — CEPHALEXIN 500 MG PO CAPS: 1000.0000 mg | ORAL_CAPSULE | Freq: Two times a day (BID) | ORAL | 0 refills | Status: DC

## 2020-12-05 MED ORDER — IBUPROFEN 400 MG PO TABS: 400.0000 mg | ORAL_TABLET | Freq: Four times a day (QID) | ORAL | 0 refills | Status: DC | PRN

## 2020-12-05 MED ORDER — DICLOFENAC SODIUM 1 % EX GEL: 2.0000 g | Freq: Four times a day (QID) | CUTANEOUS | 1 refills | Status: DC

## 2020-12-05 NOTE — ED Triage Notes (Signed)
Right knee has arthritis and the left knee is amputated and patient reports stump has an odor.  (surgery 2003)

## 2020-12-05 NOTE — ED Provider Notes (Signed)
[facility]    CSN: [id number] Arrival date & time: 12/05/20  1743      History   Chief Complaint Chief Complaint  Patient presents with  . Knee Pain    HPI Carl Vang is a 60 y.o. male.   Patient presents with odor coming from left knee. BKA in 2003 with no prior complications. Denies pain, tenderness, swelling, redness, drainage, fever or chills. Wears prosthetic with no complications.   Concerned with intermittent swelling, stiffness and aching of right knee that has been occurring for long period of time. Worse in the morning and after long periods of walking or being on feet. Typically resolves with rest. ROM intact. Denies numbness and tingling.   Past Medical History:  Diagnosis Date  . Hyperlipidemia   . Hypertension   . Osteoarthritis of knee   . Prediabetes     Patient Active Problem List   Diagnosis Date Noted  . Elevated hemoglobin A1c 01/28/2017  . Hypertension 04/08/2016  . Primary osteoarthritis of right knee 01/23/2015  . IFG (impaired fasting glucose) 04/09/2014  . Traumatic amputation of left leg below knee, sequela (HCC) 06/16/2013    Past Surgical History:  Procedure Laterality Date  . COLONOSCOPY WITH PROPOFOL N/A 08/06/2014   Procedure: COLONOSCOPY WITH PROPOFOL;  Surgeon: Charolett Bumpers, MD;  Location: [facility];  Service: Endoscopy;  Laterality: N/A;  . left leg below knee amputation   2003   in accident at work   . MOUTH SURGERY    . TOTAL KNEE ARTHROPLASTY Right 04/08/2018   Procedure: RIGHT TOTAL KNEE ARTHROPLASTY;  Surgeon: Jodi Geralds, MD;  Location: [facility];  Service: Orthopedics;  Laterality: Right;       Home Medications    Prior to Admission medications   Medication Sig Start Date End Date Taking? Authorizing Provider  amLODipine (NORVASC) 5 MG tablet Take 1 tablet (5 mg total) by mouth daily. 02/27/19  Yes Claiborne Rigg, NP  cephALEXin (KEFLEX) 500 MG capsule Take 2 capsules (1,000 mg total) by mouth 2 (two)  times daily. 12/05/20  Yes Azaleah Usman R, NP  diclofenac Sodium (VOLTAREN) 1 % GEL Apply 2 g topically 4 (four) times daily. 12/05/20  Yes Cline Draheim R, NP  ibuprofen (ADVIL) 400 MG tablet Take 1 tablet (400 mg total) by mouth every 6 (six) hours as needed. 12/05/20  Yes Renji Berwick, Elita Boone, NP  spironolactone (ALDACTONE) 25 MG tablet Take 1 tablet (25 mg total) by mouth daily. 02/27/19  Yes Claiborne Rigg, NP  atorvastatin (LIPITOR) 20 MG tablet Take 1 tablet (20 mg total) by mouth every evening. Patient not taking: Reported on 12/05/2020 02/27/19 05/28/19  Claiborne Rigg, NP  fluticasone [facility]) 50 MCG/ACT nasal spray Place 1 spray into both nostrils daily. Patient not taking: Reported on 12/05/2020 10/06/18   Janace Aris, NP    Family History Family History  Problem Relation Age of Onset  . Diabetes Mother   . Hypertension Mother   . Cancer Mother   . Stroke Mother   . Diabetes Father   . Hypertension Father   . Diabetes Daughter   . Hypertension Daughter   . Heart disease Daughter     Social History Social History   Tobacco Use  . Smoking status: Never Smoker  . Smokeless tobacco: Never Used  Vaping Use  . Vaping Use: Never used  Substance Use Topics  . Alcohol use: Yes  . Drug use: No     Allergies  Patient has no known allergies.   Review of Systems Review of Systems  Defer to HPI   Physical Exam Triage Vital Signs ED Triage Vitals  Enc Vitals Group     BP 12/05/20 1809 138/82     Pulse Rate 12/05/20 1809 (!) 103     Resp 12/05/20 1809 18     Temp 12/05/20 1809 98.8 F (37.1 C)     Temp Source 12/05/20 1809 Oral     SpO2 12/05/20 1809 98 %     Weight --      Height --      Head Circumference --      Peak Flow --      Pain Score 12/05/20 1806 8     Pain Loc --      Pain Edu? --      Excl. in GC? --    No data found.  Updated Vital Signs BP 138/82 (BP Location: Right Arm)   Pulse (!) 103   Temp 98.8 F (37.1 C) (Oral)   Resp 18   SpO2  98%   Visual Acuity Right Eye Distance:   Left Eye Distance:   Bilateral Distance:    Right Eye Near:   Left Eye Near:    Bilateral Near:     Physical Exam Constitutional:      Appearance: Normal appearance. He is normal weight.  HENT:     Head: Normocephalic.  Pulmonary:     Effort: Pulmonary effort is normal.  Musculoskeletal:     Right knee: Normal.     Comments: BKA left knee, erythema at site with mild streaking and papules towards medal left thigh, hard [place] area in center of knee, defer to picture for reference   Skin:    General: Skin is warm and dry.  Neurological:     General: No focal deficit present.     Mental Status: He is alert and oriented to person, place, and time. Mental status is at baseline.  Psychiatric:        Mood and Affect: Mood normal.        Behavior: Behavior normal.        Thought Content: Thought content normal.        Judgment: Judgment normal.      UC Treatments / Results  Labs (all labs ordered are listed, but only abnormal results are displayed) Labs Reviewed - No data to display  EKG   Radiology No results found.  Procedures Procedures (including critical care time)  Medications Ordered in UC Medications - No data to display  Initial Impression / Assessment and Plan / UC Course  I have reviewed the triage vital signs and the nursing notes.  Pertinent labs & imaging results that were available during my care of the patient were reviewed by me and considered in my medical decision making (see chart for details).  Cellulitis of left knee Osteoarthritis of right knee  1. Keflex 1000 mg bid for 5 days, strict return precautions given for worsening infection 2. Ibuprofen 400 mg every 6 hours prn 3. Diclofenac 1%  gel 4 times a day prn Final Clinical Impressions(s) / UC Diagnoses   Final diagnoses:  Osteoarthritis of right knee, unspecified osteoarthritis type  Cellulitis of right knee     Discharge Instructions      Take Keflex( antibiotic) two pills once in the morning and two pills in the evening for the next 5 days   If fever or chills begin, pain  or swelling begins, redness spreads up legs, drainage begins or odor worsen please return for reevaluation   Can use cream of right knee four times a day as needed to help with pain  Can take one ibuprofen pill every 6 hours as needed to help with pain    ED Prescriptions    Medication Sig Dispense Auth. Provider   cephALEXin (KEFLEX) 500 MG capsule Take 2 capsules (1,000 mg total) by mouth 2 (two) times daily. 20 capsule Larnell Granlund R, NP   ibuprofen (ADVIL) 400 MG tablet Take 1 tablet (400 mg total) by mouth every 6 (six) hours as needed. 30 tablet Deb Loudin R, NP   diclofenac Sodium (VOLTAREN) 1 % GEL Apply 2 g topically 4 (four) times daily. 100 g Valinda Hoar, NP     PDMP not reviewed this encounter.   Valinda Hoar, [place] 12/06/20 [phone]

## 2020-12-05 NOTE — Discharge Instructions (Signed)
Take Keflex( antibiotic) two pills once in the morning and two pills in the evening for the next 5 days   If fever or chills begin, pain or swelling begins, redness spreads up legs, drainage begins or odor worsen please return for reevaluation   Can use cream of right knee four times a day as needed to help with pain  Can take one ibuprofen pill every 6 hours as needed to help with pain

## 2021-01-10 ENCOUNTER — Ambulatory Visit (HOSPITAL_COMMUNITY)
Admission: EM | Admit: 2021-01-10 | Discharge: 2021-01-10 | Disposition: A | Discharge status: Home / Self Care | Payer: Medicare Other | Attending: Emergency Medicine | Admitting: Emergency Medicine

## 2021-01-10 ENCOUNTER — Encounter (HOSPITAL_COMMUNITY): Payer: Self-pay | Admitting: Emergency Medicine

## 2021-01-10 ENCOUNTER — Other Ambulatory Visit: Payer: Self-pay

## 2021-01-10 DIAGNOSIS — L03116 Cellulitis of left lower limb: Secondary | ICD-10-CM | POA: Diagnosis not present

## 2021-01-10 MED ORDER — DOXYCYCLINE HYCLATE 100 MG PO CAPS: 100.0000 mg | ORAL_CAPSULE | Freq: Two times a day (BID) | ORAL | 0 refills | Status: DC

## 2021-01-10 NOTE — ED Provider Notes (Signed)
[facility]    CSN: [id number] Arrival date & time: 01/10/21  1510      History   Chief Complaint Chief Complaint  Patient presents with   Leg Pain    L BKA  ; infection at amputation site    HPI Carl Vang is a 60 y.o. male.   Patient here for evaluation of left BKA.  Patient was seen and evaluated in June for the same and given Keflex.  Patient reports taking antibiotics as prescribed and to completion.  Reports symptoms resolved but then returned approximately a week ago.  Reports noticing the odor and drainage on his sleeve.  Denies any trauma, injury, or other precipitating event.  Denies any specific alleviating or aggravating factors.  Denies any fevers, chest pain, shortness of breath, N/V/D, numbness, tingling, weakness, abdominal pain, or headaches.     The history is provided by the patient.  Leg Pain  Past Medical History:  Diagnosis Date   Hyperlipidemia    Hypertension    Osteoarthritis of knee    Prediabetes     Patient Active Problem List   Diagnosis Date Noted   Elevated hemoglobin A1c 01/28/2017   Hypertension 04/08/2016   Primary osteoarthritis of right knee 01/23/2015   IFG (impaired fasting glucose) 04/09/2014   Traumatic amputation of left leg below knee, sequela (HCC) 06/16/2013    Past Surgical History:  Procedure Laterality Date   COLONOSCOPY WITH PROPOFOL N/A 08/06/2014   Procedure: COLONOSCOPY WITH PROPOFOL;  Surgeon: Charolett Bumpers, MD;  Location: [facility];  Service: Endoscopy;  Laterality: N/A;   left leg below knee amputation   2003   in accident at work    MOUTH SURGERY     TOTAL KNEE ARTHROPLASTY Right 04/08/2018   Procedure: RIGHT TOTAL KNEE ARTHROPLASTY;  Surgeon: Jodi Geralds, MD;  Location: [facility];  Service: Orthopedics;  Laterality: Right;       Home Medications    Prior to Admission medications   Medication Sig Start Date End Date Taking? Authorizing Provider  cephALEXin (KEFLEX) 500 MG capsule Take 2  capsules (1,000 mg total) by mouth 2 (two) times daily. 12/05/20  Yes White, Elita Boone, NP  doxycycline (VIBRAMYCIN) 100 MG capsule Take 1 capsule (100 mg total) by mouth 2 (two) times daily. 01/10/21  Yes Ivette Loyal, NP  amLODipine (NORVASC) 5 MG tablet Take 1 tablet (5 mg total) by mouth daily. 02/27/19   Claiborne Rigg, NP  atorvastatin (LIPITOR) 20 MG tablet Take 1 tablet (20 mg total) by mouth every evening. Patient not taking: Reported on 12/05/2020 02/27/19 05/28/19  Claiborne Rigg, NP  diclofenac Sodium (VOLTAREN) 1 % GEL Apply 2 g topically 4 (four) times daily. 12/05/20   White, Elita Boone, NP  fluticasone (FLONASE) 50 MCG/ACT nasal spray Place 1 spray into both nostrils daily. Patient not taking: Reported on 12/05/2020 10/06/18   Dahlia Byes A, NP  ibuprofen (ADVIL) 400 MG tablet Take 1 tablet (400 mg total) by mouth every 6 (six) hours as needed. 12/05/20   Valinda Hoar, NP  spironolactone (ALDACTONE) 25 MG tablet Take 1 tablet (25 mg total) by mouth daily. 02/27/19   Claiborne Rigg, NP    Family History Family History  Problem Relation Age of Onset   Diabetes Mother    Hypertension Mother    Cancer Mother    Stroke Mother    Diabetes Father    Hypertension Father    Diabetes Daughter  Hypertension Daughter    Heart disease Daughter     Social History Social History   Tobacco Use   Smoking status: Never   Smokeless tobacco: Never  Vaping Use   Vaping Use: Never used  Substance Use Topics   Alcohol use: Yes   Drug use: No     Allergies   Patient has no known allergies.   Review of Systems Review of Systems  Skin:  Positive for color change and wound.  All other systems reviewed and are negative.   Physical Exam Triage Vital Signs ED Triage Vitals  Enc Vitals Group     BP 01/10/21 1534 (!) 155/90     Pulse Rate 01/10/21 1534 98     Resp 01/10/21 1534 16     Temp 01/10/21 1534 99.1 F (37.3 C)     Temp Source 01/10/21 1534 Oral     SpO2 01/10/21  1534 97 %     Weight --      Height --      Head Circumference --      Peak Flow --      Pain Score 01/10/21 1532 8     Pain Loc --      Pain Edu? --      Excl. in GC? --    No data found.  Updated Vital Signs BP (!) 155/90   Pulse 98   Temp 99.1 F (37.3 C) (Oral)   Resp 16   SpO2 97%   Visual Acuity Right Eye Distance:   Left Eye Distance:   Bilateral Distance:    Right Eye Near:   Left Eye Near:    Bilateral Near:     Physical Exam Vitals and nursing note reviewed.  Constitutional:      General: He is not in acute distress.    Appearance: Normal appearance. He is not ill-appearing, toxic-appearing or diaphoretic.  HENT:     Head: Normocephalic and atraumatic.  Eyes:     Conjunctiva/sclera: Conjunctivae normal.  Cardiovascular:     Rate and Rhythm: Normal rate.     Pulses: Normal pulses.  Pulmonary:     Effort: Pulmonary effort is normal.  Abdominal:     General: Abdomen is flat.  Musculoskeletal:        General: Normal range of motion.     Cervical back: Normal range of motion.     Comments: LLE with some erythema to amputation site, see photo below     Left Lower Extremity: Left leg is amputated below ankle.  Skin:    General: Skin is warm and dry.  Neurological:     General: No focal deficit present.     Mental Status: He is alert and oriented to person, place, and time.  Psychiatric:        Mood and Affect: Mood normal.      UC Treatments / Results  Labs (all labs ordered are listed, but only abnormal results are displayed) Labs Reviewed - No data to display  EKG   Radiology No results found.  Procedures Procedures (including critical care time)  Medications Ordered in UC Medications - No data to display  Initial Impression / Assessment and Plan / UC Course  I have reviewed the triage vital signs and the nursing notes.  Pertinent labs & imaging results that were available during my care of the patient were reviewed by me and  considered in my medical decision making (see chart for details).    Assessment  negative for red flags or concerns.  Possible cellulitis that we will treat with doxycycline twice a day for the next 10 days.  Keep wound clean and dry.  Follow-up with primary care as soon as possible for reevaluation. Final Clinical Impressions(s) / UC Diagnoses   Final diagnoses:  Cellulitis of left lower extremity     Discharge Instructions      Take the doxycycline twice a day for the next 10 days.    Follow up with your primary care doctor for re-evaluation.       ED Prescriptions     Medication Sig Dispense Auth. Provider   doxycycline (VIBRAMYCIN) 100 MG capsule Take 1 capsule (100 mg total) by mouth 2 (two) times daily. 20 capsule Ivette Loyal, NP      PDMP not reviewed this encounter.   Ivette Loyal, NP 01/10/21 1610

## 2021-01-10 NOTE — ED Triage Notes (Signed)
L BKa done in 2003. Believes he has infection at amputation site. Reports drainage and foul smell for 1 week.

## 2021-01-10 NOTE — Discharge Instructions (Addendum)
Take the doxycycline twice a day for the next 10 days.    Follow up with your primary care doctor for re-evaluation.

## 2021-03-05 ENCOUNTER — Ambulatory Visit (HOSPITAL_COMMUNITY)
Admission: EM | Admit: 2021-03-05 | Discharge: 2021-03-05 | Disposition: A | Discharge status: Home / Self Care | Payer: Medicare Other | Attending: Family Medicine | Admitting: Family Medicine

## 2021-03-05 ENCOUNTER — Encounter (HOSPITAL_COMMUNITY): Payer: Self-pay

## 2021-03-05 DIAGNOSIS — H6123 Impacted cerumen, bilateral: Secondary | ICD-10-CM

## 2021-03-05 NOTE — ED Triage Notes (Signed)
Pt presents with bilateral ear fullness for past few days.

## 2021-03-05 NOTE — ED Provider Notes (Addendum)
[facility]    CSN: [id number] Arrival date & time: 03/05/21  0907      History   Chief Complaint Chief Complaint  Patient presents with   Ear Fullness    HPI Carl Vang is a 60 y.o. male.   Patient presenting today with several day history of bilateral ear pressure, fullness.  He states he has minimal hearing at this point.  His nurse try to clean his ears out with a Q-tip but this made it worse.  He denies pain, drainage, fevers, chills, headaches, dizziness.  Thinks he has earwax buildup.   Past Medical History:  Diagnosis Date   Hyperlipidemia    Hypertension    Osteoarthritis of knee    Prediabetes     Patient Active Problem List   Diagnosis Date Noted   Elevated hemoglobin A1c 01/28/2017   Hypertension 04/08/2016   Primary osteoarthritis of right knee 01/23/2015   IFG (impaired fasting glucose) 04/09/2014   Traumatic amputation of left leg below knee, sequela (HCC) 06/16/2013    Past Surgical History:  Procedure Laterality Date   COLONOSCOPY WITH PROPOFOL N/A 08/06/2014   Procedure: COLONOSCOPY WITH PROPOFOL;  Surgeon: Charolett Bumpers, MD;  Location: [facility];  Service: Endoscopy;  Laterality: N/A;   left leg below knee amputation   2003   in accident at work    MOUTH SURGERY     TOTAL KNEE ARTHROPLASTY Right 04/08/2018   Procedure: RIGHT TOTAL KNEE ARTHROPLASTY;  Surgeon: Jodi Geralds, MD;  Location: [facility];  Service: Orthopedics;  Laterality: Right;       Home Medications    Prior to Admission medications   Medication Sig Start Date End Date Taking? Authorizing Provider  amLODipine (NORVASC) 5 MG tablet Take 1 tablet (5 mg total) by mouth daily. 02/27/19   Claiborne Rigg, NP  atorvastatin (LIPITOR) 20 MG tablet Take 1 tablet (20 mg total) by mouth every evening. Patient not taking: Reported on 12/05/2020 02/27/19 05/28/19  Claiborne Rigg, NP  cephALEXin (KEFLEX) 500 MG capsule Take 2 capsules (1,000 mg total) by mouth 2 (two) times  daily. 12/05/20   Valinda Hoar, NP  diclofenac Sodium (VOLTAREN) 1 % GEL Apply 2 g topically 4 (four) times daily. 12/05/20   Valinda Hoar, NP  doxycycline (VIBRAMYCIN) 100 MG capsule Take 1 capsule (100 mg total) by mouth 2 (two) times daily. 01/10/21   Ivette Loyal, NP  fluticasone (FLONASE) 50 MCG/ACT nasal spray Place 1 spray into both nostrils daily. Patient not taking: Reported on 12/05/2020 10/06/18   Dahlia Byes A, NP  ibuprofen (ADVIL) 400 MG tablet Take 1 tablet (400 mg total) by mouth every 6 (six) hours as needed. 12/05/20   Valinda Hoar, NP  spironolactone (ALDACTONE) 25 MG tablet Take 1 tablet (25 mg total) by mouth daily. 02/27/19   Claiborne Rigg, NP    Family History Family History  Problem Relation Age of Onset   Diabetes Mother    Hypertension Mother    Cancer Mother    Stroke Mother    Diabetes Father    Hypertension Father    Diabetes Daughter    Hypertension Daughter    Heart disease Daughter     Social History Social History   Tobacco Use   Smoking status: Never   Smokeless tobacco: Never  Vaping Use   Vaping Use: Never used  Substance Use Topics   Alcohol use: Yes   Drug use: No  Allergies   Patient has no known allergies.   Review of Systems Review of Systems Per HPI  Physical Exam Triage Vital Signs ED Triage Vitals [03/05/21 1120]  Enc Vitals Group     BP (!) 163/94     Pulse Rate 70     Resp 16     Temp 98.6 F (37 C)     Temp Source Oral     SpO2 93 %     Weight      Height      Head Circumference      Peak Flow      Pain Score 0     Pain Loc      Pain Edu?      Excl. in GC?    No data found.  Updated Vital Signs BP (!) 163/94 (BP Location: Left Arm)   Pulse 70   Temp 98.6 F (37 C) (Oral)   Resp 16   SpO2 93%   Visual Acuity Right Eye Distance:   Left Eye Distance:   Bilateral Distance:    Right Eye Near:   Left Eye Near:    Bilateral Near:     Physical Exam Vitals and nursing note reviewed.   Constitutional:      Appearance: Normal appearance.  HENT:     Head: Atraumatic.     Right Ear: There is impacted cerumen.     Left Ear: There is impacted cerumen.     Nose: Nose normal.  Eyes:     Extraocular Movements: Extraocular movements intact.     Conjunctiva/sclera: Conjunctivae normal.  Cardiovascular:     Rate and Rhythm: Normal rate and regular rhythm.  Pulmonary:     Effort: Pulmonary effort is normal.     Breath sounds: Normal breath sounds.  Musculoskeletal:        General: Normal range of motion.     Cervical back: Normal range of motion and neck supple.  Skin:    General: Skin is warm and dry.  Neurological:     General: No focal deficit present.     Mental Status: He is oriented to person, place, and time.  Psychiatric:        Mood and Affect: Mood normal.        Thought Content: Thought content normal.        Judgment: Judgment normal.     UC Treatments / Results  Labs (all labs ordered are listed, but only abnormal results are displayed) Labs Reviewed - No data to display  EKG   Radiology No results found.  Procedures Procedures (including critical care time)  Medications Ordered in UC Medications - No data to display  Initial Impression / Assessment and Plan / UC Course  I have reviewed the triage vital signs and the nursing notes.  Pertinent labs & imaging results that were available during my care of the patient were reviewed by me and considered in my medical decision making (see chart for details).     Impaction present b/l, which was successfully cleared with lavage with warm water and peroxide. TMs visualized and benign b/l post procedure and patient tolerated well without issue. Discussed debrox prn, avoiding use of qtips. Return for worsening sxs.   Final Clinical Impressions(s) / UC Diagnoses   Final diagnoses:  Bilateral impacted cerumen   Discharge Instructions   None    ED Prescriptions   None    PDMP not reviewed  this encounter.   Carl Nearing,  PA-C 03/05/21 1257    Roosvelt Maser [place], [place] 03/12/21 [phone]

## 2021-03-25 ENCOUNTER — Telehealth: Payer: Self-pay | Admitting: Nurse Practitioner

## 2021-03-25 NOTE — Telephone Encounter (Signed)
Copied from CRM [id number]. Topic: General - Other >> Mar 20, 2021 10:25 AM Traci Sermon wrote: Reason for CRM: Pt called in stating he needs PCP to send over a prescription for his leg, pt states he does not know the medication but that it needs to be [facility] on [address], please advise

## 2021-03-26 NOTE — Telephone Encounter (Signed)
NEEDS OFFICE VISIT. He has an appt with angela on the 6th. Needs to discuss the reason he needs this equipment as I have not seen him in 2 years

## 2021-03-26 NOTE — Telephone Encounter (Signed)
Pt called and he needs orders/RX for a prosthetic, liners and supplies sent to [facility] on [address]/ they are waiting on these and have advised pt to call since they havent received the order/RX  Pt also mention needing an Rx for Tylenol 3 for his leg pain sent to  [id number] - [place], [place] - [address] Phone:  [phone]  Fax:  [phone]    Please advise asap

## 2021-03-27 NOTE — Telephone Encounter (Signed)
Pt called and is requesting to have nurse give a call back to explain why he has not been able to receive this prescription. Please advise.

## 2021-03-27 NOTE — Telephone Encounter (Signed)
Called pt and Vm not set up.  

## 2021-04-01 DIAGNOSIS — Z89512 Acquired absence of left leg below knee: Secondary | ICD-10-CM | POA: Diagnosis not present

## 2021-04-10 ENCOUNTER — Ambulatory Visit: Payer: Medicare Other | Admitting: Physician Assistant

## 2021-04-24 ENCOUNTER — Ambulatory Visit: Payer: Medicare Other | Admitting: Physician Assistant

## 2021-05-19 DIAGNOSIS — M25561 Pain in right knee: Secondary | ICD-10-CM | POA: Diagnosis not present

## 2021-05-19 DIAGNOSIS — Z96651 Presence of right artificial knee joint: Secondary | ICD-10-CM | POA: Diagnosis not present

## 2021-06-17 ENCOUNTER — Ambulatory Visit: Payer: Medicare Other | Admitting: Internal Medicine

## 2021-06-18 ENCOUNTER — Ambulatory Visit: Payer: Medicare Other | Admitting: Nurse Practitioner

## 2021-11-11 DIAGNOSIS — H905 Unspecified sensorineural hearing loss: Secondary | ICD-10-CM | POA: Diagnosis not present

## 2021-12-26 ENCOUNTER — Ambulatory Visit: Payer: Medicare Other | Admitting: Nurse Practitioner

## 2022-02-10 ENCOUNTER — Ambulatory Visit: Payer: Medicare Other | Admitting: Podiatry

## 2022-02-11 ENCOUNTER — Ambulatory Visit (INDEPENDENT_AMBULATORY_CARE_PROVIDER_SITE_OTHER): Payer: Medicare Other

## 2022-02-11 ENCOUNTER — Ambulatory Visit (INDEPENDENT_AMBULATORY_CARE_PROVIDER_SITE_OTHER): Payer: Medicare Other | Admitting: Podiatry

## 2022-02-11 DIAGNOSIS — M779 Enthesopathy, unspecified: Secondary | ICD-10-CM

## 2022-02-11 DIAGNOSIS — M79674 Pain in right toe(s): Secondary | ICD-10-CM

## 2022-02-11 DIAGNOSIS — M79671 Pain in right foot: Secondary | ICD-10-CM | POA: Diagnosis not present

## 2022-02-11 DIAGNOSIS — B351 Tinea unguium: Secondary | ICD-10-CM | POA: Diagnosis not present

## 2022-02-17 ENCOUNTER — Telehealth: Payer: Self-pay

## 2022-02-17 ENCOUNTER — Encounter: Payer: Self-pay | Admitting: Critical Care Medicine

## 2022-02-17 ENCOUNTER — Ambulatory Visit: Payer: Medicare Other | Attending: Nurse Practitioner | Admitting: Critical Care Medicine

## 2022-02-17 VITALS — BP 150/80 | HR 91 | Temp 98.6°F | Resp 15 | Ht 66.0 in | Wt 167.0 lb

## 2022-02-17 DIAGNOSIS — R269 Unspecified abnormalities of gait and mobility: Secondary | ICD-10-CM | POA: Diagnosis not present

## 2022-02-17 DIAGNOSIS — R7309 Other abnormal glucose: Secondary | ICD-10-CM | POA: Diagnosis not present

## 2022-02-17 DIAGNOSIS — E782 Mixed hyperlipidemia: Secondary | ICD-10-CM | POA: Diagnosis not present

## 2022-02-17 DIAGNOSIS — M1711 Unilateral primary osteoarthritis, right knee: Secondary | ICD-10-CM

## 2022-02-17 DIAGNOSIS — S88112S Complete traumatic amputation at level between knee and ankle, left lower leg, sequela: Secondary | ICD-10-CM | POA: Diagnosis not present

## 2022-02-17 DIAGNOSIS — R7301 Impaired fasting glucose: Secondary | ICD-10-CM

## 2022-02-17 DIAGNOSIS — Z96651 Presence of right artificial knee joint: Secondary | ICD-10-CM

## 2022-02-17 DIAGNOSIS — E785 Hyperlipidemia, unspecified: Secondary | ICD-10-CM | POA: Insufficient documentation

## 2022-02-17 DIAGNOSIS — Z96659 Presence of unspecified artificial knee joint: Secondary | ICD-10-CM | POA: Insufficient documentation

## 2022-02-17 DIAGNOSIS — R739 Hyperglycemia, unspecified: Secondary | ICD-10-CM

## 2022-02-17 DIAGNOSIS — I10 Essential (primary) hypertension: Secondary | ICD-10-CM | POA: Diagnosis not present

## 2022-02-17 MED ORDER — AMLODIPINE BESYLATE 10 MG PO TABS: 10.0000 mg | ORAL_TABLET | Freq: Every day | ORAL | 2 refills | Status: DC

## 2022-02-17 MED ORDER — ATORVASTATIN CALCIUM 20 MG PO TABS: 20.0000 mg | ORAL_TABLET | Freq: Every evening | ORAL | 1 refills | Status: DC

## 2022-02-17 MED ORDER — CHLORTHALIDONE 25 MG PO TABS: 25.0000 mg | ORAL_TABLET | Freq: Every day | ORAL | 2 refills | Status: DC

## 2022-02-17 NOTE — Telephone Encounter (Signed)
I cannot offer pain medications

## 2022-02-17 NOTE — Assessment & Plan Note (Signed)
Assess glucose and A1c

## 2022-02-17 NOTE — Assessment & Plan Note (Signed)
Amputation still present patient using prosthesis

## 2022-02-17 NOTE — Assessment & Plan Note (Signed)
History of total knee replacement on the right may have to have a revision per orthopedics

## 2022-02-17 NOTE — Assessment & Plan Note (Signed)
May need revision

## 2022-02-17 NOTE — Assessment & Plan Note (Signed)
Will give scat form to case manager and also get patient an appointment for [facility] assessment

## 2022-02-17 NOTE — Assessment & Plan Note (Signed)
Reassess A1c ?

## 2022-02-17 NOTE — Assessment & Plan Note (Signed)
Hypertension not controlled due to lack of medications will begin amlodipine 10 mg daily and chlorthalidone 25 mg daily  Assess metabolic panel  Return short-term follow-up for blood pressure assessments

## 2022-02-17 NOTE — Progress Notes (Signed)
Established Patient Office Visit  Subjective   Patient ID: Carl Vang, male    DOB: 06-03-1961  Age: 61 y.o. MRN: [id number]  Chief Complaint  Patient presents with   Hypertension   Medication Refill    Hasn't been taking meds due to not being able to make appointments    61 y.o.M HTN f/u not seen since 2021  Patient's had a traumatic above-knee amputation on the left and total knee replacement on the right and has to use 2 canes to ambulate.  Ambulation has been quite restricted in this regard.  He has increased pain in the right leg when he ambulates and he is being seen and followed by [facility] orthopedics.  He would like to get a Hoveround.  He has paperwork for this but did not have time to complete this assessment at this visit.  He is out of all his medications.  On arrival blood pressure is elevated 150/80.  He formally was on low-dose amlodipine and a fluid medication.  Patient denies any symptom complex other than his orthopedic complaints.      Review of Systems  Constitutional:  Negative for chills, diaphoresis, fever, malaise/fatigue and weight loss.  HENT:  Negative for congestion, hearing loss, nosebleeds, sore throat and tinnitus.   Eyes:  Negative for blurred vision, photophobia and redness.  Respiratory:  Positive for cough. Negative for hemoptysis, sputum production, shortness of breath, wheezing and stridor.   Cardiovascular:  Positive for chest pain. Negative for palpitations, orthopnea, claudication, leg swelling and PND.  Gastrointestinal:  Negative for abdominal pain, blood in stool, constipation, diarrhea, heartburn, nausea and vomiting.  Genitourinary:  Negative for dysuria, flank pain, frequency, hematuria and urgency.  Musculoskeletal:  Negative for back pain, falls, joint pain, myalgias and neck pain.  Skin:  Negative for itching and rash.  Neurological:  Negative for dizziness, tingling, tremors, sensory change, speech change, focal weakness,  seizures, loss of consciousness, weakness and headaches.  Endo/Heme/Allergies:  Negative for environmental allergies and polydipsia. Does not bruise/bleed easily.  Psychiatric/Behavioral:  Negative for depression, memory loss, substance abuse and suicidal ideas. The patient is not nervous/anxious and does not have insomnia.       Objective:     BP (!) 150/80 (BP Location: Right Arm, Patient Position: Sitting, Cuff Size: Normal)   Pulse 91   Temp 98.6 F (37 C)   Resp 15   Ht 5\' 6"  (1.676 m)   Wt 167 lb (75.8 kg)   SpO2 95%   BMI 26.95 kg/m    Physical Exam Vitals reviewed.  Constitutional:      Appearance: Normal appearance. He is well-developed. He is not diaphoretic.  HENT:     Head: Normocephalic and atraumatic.     Nose: No nasal deformity, septal deviation, mucosal edema or rhinorrhea.     Right Sinus: No maxillary sinus tenderness or frontal sinus tenderness.     Left Sinus: No maxillary sinus tenderness or frontal sinus tenderness.     Mouth/Throat:     Pharynx: No oropharyngeal exudate.  Eyes:     General: No scleral icterus.    Conjunctiva/sclera: Conjunctivae normal.     Pupils: Pupils are equal, round, and reactive to light.  Neck:     Thyroid: No thyromegaly.     Vascular: No carotid bruit or JVD.     Trachea: Trachea normal. No tracheal tenderness or tracheal deviation.  Cardiovascular:     Rate and Rhythm: Normal rate and regular rhythm.  Chest Wall: PMI is not displaced.     Pulses: Normal pulses. No decreased pulses.     Heart sounds: Normal heart sounds, S1 normal and S2 normal. Heart sounds not distant. No murmur heard.    No systolic murmur is present.     No diastolic murmur is present.     No friction rub. No gallop. No S3 or S4 sounds.  Pulmonary:     Effort: No tachypnea, accessory muscle usage or respiratory distress.     Breath sounds: No stridor. No decreased breath sounds, wheezing, rhonchi or rales.  Chest:     Chest wall: No  tenderness.  Abdominal:     General: Bowel sounds are normal. There is no distension.     Palpations: Abdomen is soft. Abdomen is not rigid.     Tenderness: There is no abdominal tenderness. There is no guarding or rebound.  Musculoskeletal:        General: Tenderness present. Normal range of motion.     Cervical back: Normal range of motion and neck supple. No edema, erythema or rigidity. No muscular tenderness. Normal range of motion.     Comments: There is a left prosthesis for the above-knee amputation on the left  Tenderness in the femoral aspect of the right total knee replacement  Lymphadenopathy:     Head:     Right side of head: No submental or submandibular adenopathy.     Left side of head: No submental or submandibular adenopathy.     Cervical: No cervical adenopathy.  Skin:    General: Skin is warm and dry.     Coloration: Skin is not pale.     Findings: No rash.     Nails: There is no clubbing.  Neurological:     General: No focal deficit present.     Mental Status: He is alert and oriented to person, place, and time. Mental status is at baseline.     Sensory: No sensory deficit.     Gait: Gait abnormal.     Comments: Has to use 2 crutches to ambulate  Psychiatric:        Speech: Speech normal.        Behavior: Behavior normal.      No results found for any visits on 02/17/22.    The 10-year ASCVD risk score (Arnett DK, et al., 2019) is: 16.7%    Assessment & Plan:   Problem List Items Addressed This Visit       Cardiovascular and Mediastinum   Hypertension - Primary (Chronic)    Hypertension not controlled due to lack of medications will begin amlodipine 10 mg daily and chlorthalidone 25 mg daily  Assess metabolic panel  Return short-term follow-up for blood pressure assessments      Relevant Medications   amLODipine (NORVASC) 10 MG tablet   atorvastatin (LIPITOR) 20 MG tablet   chlorthalidone (HYGROTON) 25 MG tablet     Endocrine   IFG  (impaired fasting glucose)    Assess glucose and A1c        Musculoskeletal and Integument   RESOLVED: Primary osteoarthritis of right knee (Chronic)    History of total knee replacement on the right may have to have a revision per orthopedics        Other   Traumatic amputation of left leg below knee, sequela (HCC) (Chronic)    Amputation still present patient using prosthesis      Elevated hemoglobin A1c    Reassess A1c  Status post total knee replacement    May need revision      Mixed hyperlipidemia    Renew atorvastatin check lipid panel      Relevant Medications   amLODipine (NORVASC) 10 MG tablet   atorvastatin (LIPITOR) 20 MG tablet   chlorthalidone (HYGROTON) 25 MG tablet   Other Relevant Orders   Lipid panel   Abnormal gait    Will give scat form to case manager and also get patient an appointment for Hoveround assessment      Other Visit Diagnoses     Essential hypertension       Relevant Medications   amLODipine (NORVASC) 10 MG tablet   atorvastatin (LIPITOR) 20 MG tablet   chlorthalidone (HYGROTON) 25 MG tablet   Other Relevant Orders   CBC with Differential/Platelet   Comprehensive metabolic panel   Hyperglycemia       Relevant Orders   Hemoglobin A1c       Return in about 4 weeks (around 03/17/2022).    Shan Levans, MD

## 2022-02-17 NOTE — Patient Instructions (Addendum)
Begin amlodipine 10 mg daily and chlorthalidone 25 mg daily for blood pressure  Begin atorvastatin 20 mg daily for cholesterol  Labs today include metabolic panel cholesterol panel blood counts  Our nurse case manager met with you today we will set you up for a visit for a Hoveround mobility chair assessment  Your scat bus forms to be filled out  See Mrs. Geryl Rankins your primary care provider in follow-up for the [facility] assessment in the next 4 to 6 weeks

## 2022-02-17 NOTE — Telephone Encounter (Signed)
I met with the patient when he was in the clinic today. We needed to  reschedule his mobility exam for the [facility] because that was not the primary reason for his appointment today.  He has been rescheduled for 03/23/2022 with Ms Raul Del, NP.  I also assisted the patient with completing an application for [employer] [place] and then faxed the application to [employer] [place] [employer].   The patient is aware that he is eligible for transportation to medical appointments through [facility] and [place] [employer].

## 2022-02-17 NOTE — Telephone Encounter (Signed)
Patient stated that he wanted some pain medication but forgot to ask when he was in office.

## 2022-02-17 NOTE — Assessment & Plan Note (Signed)
Renew atorvastatin check lipid panel

## 2022-02-18 ENCOUNTER — Telehealth: Payer: Self-pay

## 2022-02-18 LAB — COMPREHENSIVE METABOLIC PANEL
ALT: 13 IU/L (ref 0–44)
AST: 18 IU/L (ref 0–40)
Albumin/Globulin Ratio: 1.8 (ref 1.2–2.2)
Albumin: 3.8 g/dL (ref 3.8–4.9)
Alkaline Phosphatase: 63 IU/L (ref 44–121)
BUN/Creatinine Ratio: 10 (ref 10–24)
BUN: 8 mg/dL (ref 8–27)
Bilirubin Total: 0.2 mg/dL (ref 0.0–1.2)
CO2: 22 mmol/L (ref 20–29)
Calcium: 9.3 mg/dL (ref 8.6–10.2)
Chloride: 104 mmol/L (ref 96–106)
Creatinine, Ser: 0.84 mg/dL (ref 0.76–1.27)
Globulin, Total: 2.1 g/dL (ref 1.5–4.5)
Glucose: 96 mg/dL (ref 70–99)
Potassium: 4.7 mmol/L (ref 3.5–5.2)
Sodium: 142 mmol/L (ref 134–144)
Total Protein: 5.9 g/dL — ABNORMAL LOW (ref 6.0–8.5)
eGFR: 100 mL/min/{1.73_m2} (ref >59)

## 2022-02-18 LAB — CBC WITH DIFFERENTIAL/PLATELET
Basophils Absolute: 0.1 10*3/uL (ref 0.0–0.2)
Basos: 1 %
EOS (ABSOLUTE): 0.5 10*3/uL — ABNORMAL HIGH (ref 0.0–0.4)
Eos: 5 %
Hematocrit: 40.6 % (ref 37.5–51.0)
Hemoglobin: 13.4 g/dL (ref 13.0–17.7)
Immature Grans (Abs): 0 10*3/uL (ref 0.0–0.1)
Immature Granulocytes: 0 %
Lymphocytes Absolute: 2.9 10*3/uL (ref 0.7–3.1)
Lymphs: 29 %
MCH: 29 pg (ref 26.6–33.0)
MCHC: 33 g/dL (ref 31.5–35.7)
MCV: 88 fL (ref 79–97)
Monocytes Absolute: 0.5 10*3/uL (ref 0.1–0.9)
Monocytes: 6 %
Neutrophils Absolute: 5.8 10*3/uL (ref 1.4–7.0)
Neutrophils: 59 %
Platelets: 348 10*3/uL (ref 150–450)
RBC: 4.62 x10E6/uL (ref 4.14–5.80)
RDW: 12.6 % (ref 11.6–15.4)
WBC: 9.8 10*3/uL (ref 3.4–10.8)

## 2022-02-18 LAB — HEMOGLOBIN A1C
Est. average glucose Bld gHb Est-mCnc: 123 mg/dL
Hgb A1c MFr Bld: 5.9 % — ABNORMAL HIGH (ref 4.8–5.6)

## 2022-02-18 LAB — LIPID PANEL
Chol/HDL Ratio: 4.4 ratio (ref 0.0–5.0)
Cholesterol, Total: 206 mg/dL — ABNORMAL HIGH (ref 100–199)
HDL: 47 mg/dL (ref >39)
LDL Chol Calc (NIH): 133 mg/dL — ABNORMAL HIGH (ref 0–99)
Triglycerides: 145 mg/dL (ref 0–149)
VLDL Cholesterol Cal: 26 mg/dL (ref 5–40)

## 2022-02-18 MED ORDER — MELOXICAM 15 MG PO TABS: 15.0000 mg | ORAL_TABLET | Freq: Every day | ORAL | 0 refills | Status: DC

## 2022-02-18 NOTE — Progress Notes (Signed)
Let pt know cholesterol is high get back on atorvastatin as ordered.   Liver kidney blood count normal, HgA1C is only slightly elevated:   follow healthy diet no medication needed

## 2022-02-18 NOTE — Progress Notes (Signed)
Patient presents stating that he has had some low-grade pain in his right foot with patient not being a good historian also that he has some nails that bother him on that foot and a callus that becomes sore.  Patient did not give me a good history of issues upon questioning does not smoke currently Subjective:   Patient ID: Carl Vang, male   DOB: 61 y.o.   MRN: [id number]   HPI HPI listed above   Review of Systems  All other systems reviewed and are negative.       Objective:  Physical Exam Vitals and nursing note reviewed.  Constitutional:      Appearance: He is well-developed.  Pulmonary:     Effort: Pulmonary effort is normal.  Musculoskeletal:        General: Normal range of motion.  Skin:    General: Skin is warm.  Neurological:     Mental Status: He is alert.     Neurovascular status was found to be intact and muscle strength currently within normal limits with some diminishment in range of motion subtalar midtarsal joint.  He is got thickened nails right is got keratotic lesion formation that can become painful when he tries to walk and he does not again give me a good history of what type of pain he is experiencing.  Good digital perfusion      Assessment:  Appears to be more inflammatory than anything else cannot make complete determination     Plan:  H&P educated him on condition debrided nails debrided lesions advised on different types of shoe gear modifications reappoint as needed

## 2022-02-18 NOTE — Telephone Encounter (Signed)
Called patient and is aware of doctors note.  Questioned why and their something he can do for the pain.

## 2022-02-18 NOTE — Telephone Encounter (Signed)
Called patient twice but was unable to contact, voicemail box not set up   Also called about recent medication he request as well

## 2022-02-18 NOTE — Addendum Note (Signed)
Addended by: Storm Frisk on: 02/18/2022 07:49 PM   Modules accepted: Orders

## 2022-02-18 NOTE — Telephone Encounter (Signed)
-----   Message from Storm Frisk, MD sent at 02/18/2022  7:20 AM EDT ----- Let pt know cholesterol is high get back on atorvastatin as ordered.   Liver kidney blood count normal, HgA1C is only slightly elevated:   follow healthy diet no medication needed

## 2022-02-18 NOTE — Telephone Encounter (Signed)
Patient called back in requesting pain medication. Please advise patient.

## 2022-02-18 NOTE — Telephone Encounter (Signed)
Called patient twice for results and to talk with him about this Doctors note about recent med request. Unable to make contact or leave voicemail due to patient not setting up one

## 2022-02-18 NOTE — Telephone Encounter (Signed)
I will send an order for meloxicam to take daily for pain this will go to the [facility] pharmacy otherwise he should follow-up with the referral we made

## 2022-02-20 NOTE — Telephone Encounter (Signed)
Patient called back and is aware of medication at pharmacy.Patient verbalized understanding

## 2022-02-20 NOTE — Telephone Encounter (Signed)
Called patient but was unable to make contact or leave voicemail.

## 2022-02-25 ENCOUNTER — Telehealth: Payer: Self-pay | Admitting: Nurse Practitioner

## 2022-02-25 NOTE — Telephone Encounter (Signed)
Copied from CRM [id number]. 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 Pt requests call back to advise

## 2022-02-25 NOTE — Telephone Encounter (Signed)
I called patient back # [phone], but the voicemail was not set up yet.   I want to inform him that the SCAT application was sent in last week but the last page of the fax did not go through.  I re-faxed the page to [employer] yesterday.

## 2022-02-26 NOTE — Telephone Encounter (Signed)
I spoke to patient and informed him that the SCAT application was sent in last week but the last page of the fax did not go through.  I re-faxed the page to [employer] 2 days ago.  I explained that [employer] should be contacting him to schedule an assessment.  He was very Adult nurse.

## 2022-03-02 ENCOUNTER — Other Ambulatory Visit: Payer: Self-pay | Admitting: Podiatry

## 2022-03-02 DIAGNOSIS — M779 Enthesopathy, unspecified: Secondary | ICD-10-CM

## 2022-03-10 NOTE — Telephone Encounter (Signed)
Pt states he was informed by [employer] that 2 pages were missing from application faxed back  Pt requesting application re-faxed  Please assist further

## 2022-03-11 NOTE — Telephone Encounter (Signed)
FYI

## 2022-03-11 NOTE — Telephone Encounter (Signed)
I called patient [phone]  to tell him that I would fax the document for the 3rd time but his voicemail was not set up.  Application re-faxed to [employer].  I also left a message for [employer] requesting a call back to inquire what is happening with the faxes and if she has received the complete application yet.

## 2022-03-16 ENCOUNTER — Telehealth: Payer: Self-pay | Admitting: Emergency Medicine

## 2022-03-16 NOTE — Telephone Encounter (Signed)
Copied from CRM [id number]. Topic: Referral - Request for Referral >> Mar 16, 2022  3:38 PM Everette C wrote: Has patient seen PCP for this complaint? Yes.   *If NO, is insurance requiring patient see PCP for this issue before PCP can refer them? Referral for which specialty: General Surgery  Preferred provider/office: [facility] Reason for referral: Hernia concerns

## 2022-03-16 NOTE — Telephone Encounter (Signed)
Please advise 

## 2022-03-17 ENCOUNTER — Other Ambulatory Visit: Payer: Self-pay | Admitting: Nurse Practitioner

## 2022-03-17 DIAGNOSIS — K4091 Unilateral inguinal hernia, without obstruction or gangrene, recurrent: Secondary | ICD-10-CM

## 2022-03-18 ENCOUNTER — Telehealth: Payer: Self-pay

## 2022-03-18 NOTE — Telephone Encounter (Signed)
I spoke to Courtney/[employer] and she confirmed that she has received all of the pages for his transportation application.

## 2022-03-19 ENCOUNTER — Other Ambulatory Visit: Payer: Self-pay | Admitting: Critical Care Medicine

## 2022-03-23 ENCOUNTER — Encounter: Payer: Self-pay | Admitting: Nurse Practitioner

## 2022-03-23 ENCOUNTER — Ambulatory Visit: Payer: Medicare Other | Attending: Nurse Practitioner | Admitting: Nurse Practitioner

## 2022-03-23 VITALS — BP 124/76 | HR 73 | Temp 97.9°F | Ht 65.0 in | Wt 169.2 lb

## 2022-03-23 DIAGNOSIS — I10 Essential (primary) hypertension: Secondary | ICD-10-CM | POA: Diagnosis not present

## 2022-03-23 DIAGNOSIS — Z0189 Encounter for other specified special examinations: Secondary | ICD-10-CM | POA: Diagnosis not present

## 2022-03-23 DIAGNOSIS — M1711 Unilateral primary osteoarthritis, right knee: Secondary | ICD-10-CM | POA: Diagnosis not present

## 2022-03-23 DIAGNOSIS — Z23 Encounter for immunization: Secondary | ICD-10-CM

## 2022-03-23 MED ORDER — ACETAMINOPHEN-CODEINE 300-30 MG PO TABS: 1.0000 | ORAL_TABLET | Freq: Two times a day (BID) | ORAL | 0 refills | Status: DC | PRN

## 2022-03-23 NOTE — Patient Instructions (Signed)
Sent referral to [facility]    Ph.# [phone] Fax # [phone] address [address]

## 2022-03-23 NOTE — Progress Notes (Signed)
Assessment & Plan:  Carl Vang was seen today for hypertension and mobility exam.  Diagnoses and all orders for this visit:  Essential hypertension Continue amlodipine and chlorthalidone as prescribed.  Reminded to bring in blood pressure log for follow  up appointment.  RECOMMENDATIONS: DASH/Mediterranean Diets are healthier choices for HTN.    Primary osteoarthritis of right knee -     acetaminophen-codeine (TYLENOL #3) 300-30 MG tablet; Take 1 tablet by mouth every 12 (twelve) hours as needed for moderate pain.  Encounter for rehabilitation evaluation -     Ambulatory referral to Physical Therapy  Need for immunization against influenza -     Flu Vaccine QUAD 41mo+IM (Fluarix, Fluzone & Alfiuria Quad PF)    Patient has been counseled on age-appropriate routine health concerns for screening and prevention. These are reviewed and up-to-date. Referrals have been placed accordingly. Immunizations are up-to-date or declined.    Subjective:   Chief Complaint  Patient presents with   Hypertension   Mobility Exam   HPI Carl Vang 61 y.o. male presents to office today for follow-up to hypertension.  He also has a form for [employer] that requires a mobility exam.  I have referred him to physical therapy today for this.  He has a left BKA and is currently using a walker as his assistive device.  Denies any recent falls.  HTN Blood pressure is well controlled with amlodipine 10 mg daily and chlorthalidone 25 mg daily. BP Readings from Last 3 Encounters:  03/23/22 124/76  02/17/22 (!) 150/80  03/05/21 (!) 163/94   R knee pain He has chronic right knee pain despite right total knee arthroplasty 04/08/2018.  States meloxicam has been minimally effective.  Would like to retry it Tylenol 3 which she has been on in the past.  Currently denies any use of marijuana or other illegal substances  Review of Systems  Constitutional:  Negative for fever, malaise/fatigue and weight loss.  HENT:  Negative.  Negative for nosebleeds.   Eyes: Negative.  Negative for blurred vision, double vision and photophobia.  Respiratory: Negative.  Negative for cough and shortness of breath.   Cardiovascular: Negative.  Negative for chest pain, palpitations and leg swelling.  Gastrointestinal: Negative.  Negative for heartburn, nausea and vomiting.  Musculoskeletal:  Positive for joint pain. Negative for myalgias.  Neurological: Negative.  Negative for dizziness, focal weakness, seizures and headaches.  Psychiatric/Behavioral: Negative.  Negative for suicidal ideas.     Past Medical History:  Diagnosis Date   Hyperlipidemia    Hypertension    Osteoarthritis of knee    Prediabetes     Past Surgical History:  Procedure Laterality Date   COLONOSCOPY WITH PROPOFOL N/A 08/06/2014   Procedure: COLONOSCOPY WITH PROPOFOL;  Surgeon: Charolett Bumpers, MD;  Location: [facility];  Service: Endoscopy;  Laterality: N/A;   left leg below knee amputation   2003   in accident at work    MOUTH SURGERY     TOTAL KNEE ARTHROPLASTY Right 04/08/2018   Procedure: RIGHT TOTAL KNEE ARTHROPLASTY;  Surgeon: Jodi Geralds, MD;  Location: [facility];  Service: Orthopedics;  Laterality: Right;    Family History  Problem Relation Age of Onset   Diabetes Mother    Hypertension Mother    Cancer Mother    Stroke Mother    Diabetes Father    Hypertension Father    Diabetes Daughter    Hypertension Daughter    Heart disease Daughter     Social History Reviewed  with no changes to be made today.   Outpatient Medications Prior to Visit  Medication Sig Dispense Refill   amLODipine (NORVASC) 10 MG tablet Take 1 tablet (10 mg total) by mouth daily. 90 tablet 2   atorvastatin (LIPITOR) 20 MG tablet Take 1 tablet (20 mg total) by mouth every evening. 90 tablet 1   chlorthalidone (HYGROTON) 25 MG tablet Take 1 tablet (25 mg total) by mouth daily. 90 tablet 2   meloxicam (MOBIC) 15 MG tablet TAKE 1 TABLET (15 MG TOTAL) BY  MOUTH DAILY. 30 tablet 0   No facility-administered medications prior to visit.    No Known Allergies     Objective:    BP 124/76   Pulse 73   Temp 97.9 F (36.6 C) (Oral)   Ht 5\' 5"  (1.651 m)   Wt 169 lb 3.2 oz (76.7 kg)   SpO2 96%   BMI 28.16 kg/m  Wt Readings from Last 3 Encounters:  03/23/22 169 lb 3.2 oz (76.7 kg)  02/17/22 167 lb (75.8 kg)  04/07/19 155 lb (70.3 kg)    Physical Exam Vitals and nursing note reviewed.  Constitutional:      Appearance: He is well-developed.  HENT:     Head: Normocephalic and atraumatic.  Cardiovascular:     Rate and Rhythm: Normal rate and regular rhythm.     Heart sounds: Normal heart sounds. No murmur heard.    No friction rub. No gallop.  Pulmonary:     Effort: Pulmonary effort is normal. No tachypnea or respiratory distress.     Breath sounds: Normal breath sounds. No decreased breath sounds, wheezing, rhonchi or rales.  Chest:     Chest wall: No tenderness.  Abdominal:     General: Bowel sounds are normal.     Palpations: Abdomen is soft.  Musculoskeletal:        General: Normal range of motion.     Cervical back: Normal range of motion.     Left Lower Extremity: Left leg is amputated below knee.  Skin:    General: Skin is warm and dry.  Neurological:     Mental Status: He is alert and oriented to person, place, and time.     Coordination: Coordination normal.  Psychiatric:        Behavior: Behavior normal. Behavior is cooperative.        Thought Content: Thought content normal.        Judgment: Judgment normal.          Patient has been counseled extensively about nutrition and exercise as well as the importance of adherence with medications and regular follow-up. The patient was given clear instructions to go to ER or return to medical center if symptoms don't improve, worsen or new problems develop. The patient verbalized understanding.   Follow-up: Return in about 3 months (around 06/22/2022) for HTN.    Gildardo Pounds, FNP-BC [facility] [place] [place]   03/23/2022, 1:20 PM

## 2022-03-25 ENCOUNTER — Other Ambulatory Visit: Payer: Self-pay | Admitting: Nurse Practitioner

## 2022-03-25 DIAGNOSIS — Z0189 Encounter for other specified special examinations: Secondary | ICD-10-CM

## 2022-03-25 DIAGNOSIS — S88112S Complete traumatic amputation at level between knee and ankle, left lower leg, sequela: Secondary | ICD-10-CM

## 2022-03-25 DIAGNOSIS — R269 Unspecified abnormalities of gait and mobility: Secondary | ICD-10-CM

## 2022-04-02 ENCOUNTER — Ambulatory Visit: Payer: Self-pay | Admitting: Surgery

## 2022-04-02 DIAGNOSIS — K4090 Unilateral inguinal hernia, without obstruction or gangrene, not specified as recurrent: Secondary | ICD-10-CM | POA: Diagnosis not present

## 2022-04-02 NOTE — H&P (Signed)
Subjective    Chief Complaint: New Consultation (hernia)       History of Present Illness: Carl Vang is a 61 y.o. male who is seen today as an office consultation at the request of Dr. Meredeth Ide for evaluation of New Consultation (hernia) .   This is a 61 year old male who was initially evaluated 3 years ago for a left inguinal hernia.  For various reasons related to the pandemic, his surgery was never scheduled.  The hernia has become larger.  He denies any obstructive symptoms.  He continues to have issues with chronic constipation which have been present for many years.  The bulge extends down into the left scrotum.   The patient has a traumatic left amputation with a prosthetic and a right total knee arthroplasty 2019.  He ambulates with the assistance of a walker.  He is disabled.     Review of Systems: A complete review of systems was obtained from the patient.  I have reviewed this information and discussed as appropriate with the patient.  See HPI as well for other ROS.   Review of Systems  Constitutional: Negative.   HENT: Negative.    Eyes: Negative.   Respiratory: Negative.    Cardiovascular: Negative.   Gastrointestinal: Negative.   Genitourinary: Negative.   Musculoskeletal:  Positive for joint pain.  Skin: Negative.   Neurological: Negative.   Endo/Heme/Allergies: Negative.   Psychiatric/Behavioral: Negative.         Medical History: Past Medical History  History reviewed. No pertinent past medical history.        Patient Active Problem List  Diagnosis   Elevated hemoglobin A1c   Hypertension   Mixed hyperlipidemia   Status post total knee replacement   Traumatic amputation of left leg below knee, sequela (CMS-HCC)      Past Surgical History  History reviewed. No pertinent surgical history.      Allergies  No Known Allergies     No current outpatient medications on file prior to visit.    No current facility-administered medications on file  prior to visit.      Family History       Family History  Problem Relation Age of Onset   Coronary Artery Disease (Blocked arteries around heart) Mother     Diabetes Mother     High blood pressure (Hypertension) Father     Diabetes Father     High blood pressure (Hypertension) Sister     Diabetes Sister     High blood pressure (Hypertension) Brother          Social History       Tobacco Use  Smoking Status Never  Smokeless Tobacco Never      Social History  Social History         Socioeconomic History   Marital status: Unknown  Tobacco Use   Smoking status: Never   Smokeless tobacco: Never  Substance and Sexual Activity   Alcohol use: Yes      Comment: social   Drug use: Not Currently        Objective:         Vitals:    04/02/22 1350  BP: 120/85  Pulse: 86  Temp: 36.8 C (98.2 F)  SpO2: 93%  Weight: 78.5 kg (173 lb)  Height: 165.1 cm (5\' 5" )    Body mass index is 28.79 kg/m.   Physical Exam    Constitutional:  WDWN in NAD, conversant, no obvious deformities; lying in bed comfortably  Eyes:  Pupils equal, round; sclera anicteric; moist conjunctiva; no lid lag HENT:  Oral mucosa moist; good dentition  Neck:  No masses palpated, trachea midline; no thyromegaly Lungs:  CTA bilaterally; normal respiratory effort CV:  Regular rate and rhythm; no murmurs; extremities well-perfused with no edema Abd:  +bowel sounds, soft, non-tender, no palpable organomegaly; no palpable hernias GU: Bilateral descended testes, no testicular masses, large left inguinal hernia extending down to the left hemiscrotum Musc: Left leg prosthesis.  Patient ambulates slowly with the assistance of a walker Lymphatic:  No palpable cervical or axillary lymphadenopathy Skin:  Warm, dry; no sign of jaundice Psychiatric - alert and oriented x 4; calm mood and affect         Assessment and Plan:  Diagnoses and all orders for this visit:   Non-recurrent unilateral inguinal hernia  without obstruction or gangrene   Left inguinal hernia repair with mesh.The surgical procedure has been discussed with the patient.  Potential risks, benefits, alternative treatments, and expected outcomes have been explained.  All of the patient's questions at this time have been answered.  The likelihood of reaching the patient's treatment goal is good.  The patient understand the proposed surgical procedure and wishes to proceed.     No follow-ups on file.   Carl Janik Jearld Adjutant, MD  04/02/2022 2:10 PM

## 2022-04-21 ENCOUNTER — Other Ambulatory Visit: Payer: Self-pay | Admitting: Family Medicine

## 2022-05-05 DIAGNOSIS — G8918 Other acute postprocedural pain: Secondary | ICD-10-CM | POA: Diagnosis not present

## 2022-05-05 DIAGNOSIS — K4030 Unilateral inguinal hernia, with obstruction, without gangrene, not specified as recurrent: Secondary | ICD-10-CM | POA: Diagnosis not present

## 2022-05-06 ENCOUNTER — Ambulatory Visit: Payer: Medicare Other | Attending: Nurse Practitioner

## 2022-05-06 ENCOUNTER — Other Ambulatory Visit: Payer: Self-pay

## 2022-05-06 DIAGNOSIS — R269 Unspecified abnormalities of gait and mobility: Secondary | ICD-10-CM | POA: Diagnosis not present

## 2022-05-06 DIAGNOSIS — S88112S Complete traumatic amputation at level between knee and ankle, left lower leg, sequela: Secondary | ICD-10-CM | POA: Insufficient documentation

## 2022-05-06 DIAGNOSIS — R2689 Other abnormalities of gait and mobility: Secondary | ICD-10-CM | POA: Diagnosis not present

## 2022-05-06 DIAGNOSIS — Z0189 Encounter for other specified special examinations: Secondary | ICD-10-CM | POA: Diagnosis not present

## 2022-05-06 NOTE — Therapy (Signed)
OUTPATIENT PHYSICAL THERAPY WHEELCHAIR EVALUATION   Patient Name: Carl Vang MRN: [id number] DOB:01/31/1961, 61 y.o., male Today's Date: 05/06/2022   PT End of Session - 05/06/22 1230     Visit Number 1    Number of Visits 1    PT Start Time 1230    PT Stop Time 1300    PT Time Calculation (min) 30 min    Activity Tolerance Patient tolerated treatment well    [facility] for tasks assessed/performed             Past Medical History:  Diagnosis Date   Hyperlipidemia    Hypertension    Osteoarthritis of knee    Prediabetes    Past Surgical History:  Procedure Laterality Date   COLONOSCOPY WITH PROPOFOL N/A 08/06/2014   Procedure: COLONOSCOPY WITH PROPOFOL;  Surgeon: Charolett BumpersMartin K Johnson, MD;  Location: [facility];  Service: Endoscopy;  Laterality: N/A;   left leg below knee amputation   2003   in accident at work    MOUTH SURGERY     TOTAL KNEE ARTHROPLASTY Right 04/08/2018   Procedure: RIGHT TOTAL KNEE ARTHROPLASTY;  Surgeon: Jodi GeraldsGraves, John, MD;  Location: [facility];  Service: Orthopedics;  Laterality: Right;   Patient Active Problem List   Diagnosis Date Noted   Status post total knee replacement 02/17/2022   Mixed hyperlipidemia 02/17/2022   Abnormal gait 02/17/2022   Elevated hemoglobin A1c 01/28/2017   Hypertension 04/08/2016   IFG (impaired fasting glucose) 04/09/2014   Traumatic amputation of left leg below knee, sequela (HCC) 06/16/2013    PCP: Dr. Loreen FreudZelda Flemming  REFERRING PROVIDER: Dr. Bertram DenverZelda Fleming  THERAPY DIAG:  Other abnormalities of gait and mobility  Rationale for Evaluation and Treatment Rehabilitation  SUBJECTIVE:                                                                                                                                                                                           SUBJECTIVE STATEMENT: Pt present or wheelchair evaluation. Pt had R BKA done on 04/2018. Pt is getting that R knee reconstructed  again. Pt recently had hernia surgery on 05/05/22, Pt had L BKA done in 2003. Pt reports of the R knee pain that limits him from ambulating. Pt currently ambulates with walker.   PRECAUTIONS: Fall  WEIGHT BEARING RESTRICTIONS No   OCCUPATION: diability  PLOF: Requires assistive device for independence, Needs assistance with ADLs, and Needs assistance with homemaking  PATIENT GOALS get a chair to improve ambulation         MEDICAL HISTORY:  Primary diagnosis onset:  Diagnosis  Code: R26.9 (  ICD-10-CM)  Diagnosis: - Abnormal gait   Diagnosis code:       Diagnosis:  S88.112S (ICD-10-CM) - Traumatic amputation of left leg below knee, sequela (HCC) Diagnosis  Code:  Diagnosis:   [] Progressive disease  Relevant future surgeries:     Height: 5'5" Weight: 170 lbs Explain recent changes or trends in weight:      History:  Past Medical History:  Diagnosis Date   Hyperlipidemia    Hypertension    Osteoarthritis of knee    Prediabetes             Cardio Status:  Functional Limitations:   [x] Intact  []  Impaired      Respiratory Status:  Functional Limitations:   [x] Intact  [] Impaired   [] SOB [] COPD [] O2 Dependent ______LPM  [] Ventilator Dependent  Resp equip:                                                     Objective Measure(s):   Orthotics:   [x] Amputee:      L BKA                                                       [x] Prosthesis: L BKA       HOME ENVIRONMENT:  [x] House [] Condo/town home [] Apartment [] Asst living [] LTCF         [] Own  [x] Rent   [] Lives alone [x] Lives with others -           sister                  Hours without assistance:   [] Home is accessible to patient      , pt is working on getting a ramp. Pt has 2 steps to enter the house.                           Storage of wheelchair:  [x] In home   [] Other Comments:        COMMUNITY :  TRANSPORTATION:  [x] Car [] Van [] Public Transportation [] Adapted w/c Lift []  Ambulance [] Other:                     [] Sits  in wheelchair during transport   Where is w/c stored during transport?  [] [place]  []  EZ  r   [x] Self-Driver       Drive while in  [] yes [x] no   Employment and/or school:  Specific requirements pertaining to mobility        Other:  COMMUNICATION:  Verbal Communication  [x] WFL [] receptive [] WFL [] expressive [] Understandable  [] Difficult to understand  [] non-communicative  Primary Language:_____English_________ 2nd:_____________  Communication provided by:[x] Patient [] Family [] Caregiver [] Translator   [] Uses an augmentative communication device     Manufacturer/Model :                                                                MOBILITY/BALANCE:  Sitting Balance  Standing Balance  Transfers  Ambulation   WFL      WFL  Independent   Independent   Uses UE for balance in sitting Comments:  Uses UE/device for stability Comments:   Min assist   Ambulates independently with       device:___uses RW, limited walking      Mod assist   Able to ambulate ______ feet        safely/functionally/independently    Min assist   Min assist   Max assist   Non-functional ambulator         History/High risk of falls    Mod assist   Mod assist   Dependent   Unable to ambulate    Max  assist   Max assist  Transfer method:[] 1 person 2 person sliding board squat pivot stand pivot mechanical patient lift  other:    Unable   Unable    Fall History: # of falls in the past 6 months? 0 # of "near" falls in the past 6 months? 2 times    CURRENT SEATING / MOBILITY:  Current Mobility Device: None Cane/Walker Manual Dependent Dependent w/ Tilt rScooter  Power (type of control):   Manufacturer:  Model:  Serial #:   Size:  Color:  Age:   Purchased by whom:   Current condition of mobility base:    Current seating system:                                                                       Age of seating system:     Describe posture in present seating system:    Is the current mobility meeting medical necessity?:  Yes No Describe:                                     Ability to complete Mobility-Related Activities of Daily Living (MRADL's) with Current Mobility Device:   Move room to room  Independent  Min Mod Max assist  Unable  Comments:   Meal prep  Independent  Min Mod Max assist  Unable    Feeding  Independent  Min Mod Max assist  Unable    Bathing  Independent  Min Mod Max assist  Unable    Grooming  Independent  Min Mod Max assist  Unable    UE dressing  Independent  Min Mod Max assist  Unable    LE dressing  Independent   Min Mod Max assist  Unable    Toileting  Independent  Min Mod Max assist  Unable    Bowel Mgt:  Continent  Incontinent  Accidents  Diapers  Colostomy  Bowel Program:  Bladder Mgt:  Continent  Incontinent  Accidents  Diapers  Urinal  Intermittent Cath  Indwelling Cath  Supra-pubic Cath     Current Mobility Equipment Trialed/ Ruled Out:    Does not meet mobility needs due to:    Loraine Leriche all boxes that indicate inability to use the specific equipment listed     Meets needs for safe  independent functional  ambulation  / mobility    Risk of  Falling or History of  Falls    Enviromental limitations      Cognition    Safety concerns with  physical ability    Decreased / limitations endurance  & strength     Decreased / limitations  motor skills  & coordination    Pain    Pace /  Speed    Cardiac and/or  respiratory condition    Contra - indicated by diagnosis   Cane/Crutches  []   []   []   []   []   []   []   []   []   []   []    Walker / Rollator  []  NA   []   [x]   []   []   []   [x]   []   [x]   [x]   []   []     Manual Wheelchair [id number]:  []  NA  []   []   []   []   []   []   []   []   []   []   []    Manual W/C (K0005) with power assist  []  NA  []   []   []   []   []    []   []   []   []   []   []    Scooter  []  NA  []   []   []   []   []   []   []   []   []   []   []    Power Wheelchair: standard joystick  []  NA  []   []   []   []   []   []   []   []   []   []   []    Power Wheelchair: alternative controls  []  NA  []   []   []   []   []   []   []   []   []   []   []    Summary:  The least costly alternative for independent functional mobility was found to be:    []  Crutch/Cane  []  Walker [x]  Manual w/c  []  Manual w/c with power assist   []  Scooter   []  Power w/c std joystick   []  Power w/c alternative control        []  Requires dependent care mobility device   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 for safe mobility equipment operation  [x]   Yes []   No  Patient is willing and motivated to use recommended mobility equipment  [x]   Yes []   No       []  Patient is unable to safely operate mobility equipment independently and requires dependent care equipment Comments:           SENSATION and SKIN ISSUES:  Sensation []  Intact  [x]  Impaired []  Absent []  Hyposensate []  Hypersensate  []  Defensiveness  Location(s) of impairment: L residual leg   Pressure Relief Method(s):  [x]  Lean side to side to offload (without risk of falling)  [x]   W/C push up (4+ times/hour for 15+ seconds) [x]  Stand up (without risk of falling)    []  Other: (Describe): Effective pressure relief method(s) above can be performed consistently throughout the day: rYes  r No If not, Why?:  Skin Integrity Risk:       [x]  Low risk           []  Moderate risk            []  High risk  If high risk, explain:   Skin Issues/Skin Integrity  Current skin Issues  []  Yes [x]  No []  Intact  []   Red area   []   Open area  []  Scar tissue  []  At risk from prolonged sitting  Where: History of Skin Issues  []   Yes [x]  No Where : When: Stage: Hx of skin flap surgeries  []  Yes [x]  No Where:  When:  Pain: [x]  Yes []  No   Pain Location(s): R knee Intensity scale: (0-10) : 8/10, worst with  bending, squatting, standing, walking, stairs How does pain interfere with mobility and/or MRADLs? - difficulty with prolonged walking (outside home), community ambulation, stais        MAT EVALUATION:  Neuro-Muscular Status: (Tone, Reflexive, Responses, etc.)     [x]   Intact   []  Spasticity:  []  Hypotonicity  []  Fluctuating  []  Muscle Spasms  []  Poor Righting Reactions/Poor Equilibrium Reactions  []  Primal Reflex(s):    Comments:            COMMENTS:    POSTURE:     Comments:  Pelvis Anterior/Posterior:  [x]  Neutral   []  Posterior  []  Anterior  []  Fixed - No movement []  Tendency away from neutral [x]  Flexible [x]  Self-correction []  External correction Obliquity (viewed from front)  [x]  WFL []  R Obliquity []  L Obliquity  []  Fixed - No movement []  Tendency away from neutral [x]  Flexible [x]  Self-correction []  External correction Rotation  [x]  WFL []  R anterior []  L anterior  []  Fixed - No movement []  Tendency away from neutral [x]  Flexible [x]  Self-correction []  External correction Tonal Influence Pelvis:  [x]  Normal []  Flaccid []  Low tone []  Spasticity []  Dystonia []  Pelvis thrust []  Other:    Trunk Anterior/Posterior:  [x]  WFL []  Thoracic kyphosis []  Lumbar lordosis  []  Fixed - No movement []  Tendency away from neutral [x]  Flexible [x]  Self-correction []  External correction  [x]  WFL []  Convex to left  []  Convex to right []  S-curve   []  C-curve []  Multiple curves []  Tendency away from neutral [x]  Flexible [x]  Self-correction []  External correction Rotation of shoulders and upper trunk:  [x]  Neutral []  Left-anterior []  Right- anterior []  Fixed- no movement []  Tendency away from neutral [x]  Flexible [x]  Self correction []  External correction Tonal influence Trunk:  [x]  Normal []  Flaccid []  Low tone []  Spasticity []  Dystonia []  Other:   Head & Neck  [x]  Functional []  Flexed    []  Extended []  Rotated right  []  Rotated  left []  Laterally flexed right []  Laterally flexed left []  Cervical hyperextension   [x]  Good head control []  Adequate head control []  Limited head control []  Absent head control Describe tone/movement of head and neck:      Lower Extremity Measurements: LE ROM:  Active ROM Right 05/06/2022 Left 05/06/2022  Hip flexion    Hip extension    Hip abduction    Hip adduction    Knee flexion    Knee extension    Ankle dorsiflexion    Ankle plantarflexion     (Blank rows = not tested)  LE MMT:  MMT Right 05/06/2022 Left 05/06/2022  Hip flexion 5 5  Hip extension    Hip abduction 5 5  Hip adduction    Knee flexion 5   Knee extension 5   Ankle dorsiflexion 5   Ankle plantarflexion     (Blank rows = not tested)  Hip positions:  [x]  Neutral   []  Abducted   []  Adducted  []  Subluxed   []  Dislocated   []  Fixed   []  Tendency away from neutral []  Flexible []  Self-correction []  External correction   Hip Windswept:[x]  Neutral  []  Right    []  Left  []  Subluxed   []  Dislocated   []  Fixed   []   Tendency away from neutral  Flexible  Self-correction  External correction  LE Tone:  Normal  Low tone  Spasticity  Flaccid  Dystonia  Rocks/Extends at hip  Thrust into knee extension  Pushes legs downward into footrest  Foot positioning: ROM Concerns: Dorsiflexed:  Right    Left Plantar flexed:  Right     Left Inversion:  Right     Left Eversion:  Right     Left  LE Edema:  1+ (Barely detectable impression when finger is pressed into skin)  2+ (slight indentation. 15 seconds to rebound)  3+ (deeper indentation. 30 seconds to rebound)  4+ (>30 seconds to rebound)  UE Measurements:  UPPER EXTREMITY ROM: WFL   UPPER EXTREMITY MMT:  MMT Right 05/06/2022 Left 05/06/2022  Shoulder flexion 5/5 5/5  Shoulder abduction 5/5 5/5  Shoulder adduction    Elbow flexion 5/5 5/5  Elbow extension 5/5 5/5  Wrist flexion 5/5 5/5   Wrist extension 5/5 5/5  Pinch strength    Grip strength    (Blank rows = not tested)  Shoulder Posture:  Right Tendency towards Left    Functional      Elevation      Depression      Protraction      Retraction      Internal rotation      External rotation      Subluxed     UE Tone:  Normal  Flaccid  Low tone  Spasticity   Dystonia  Other:   UE Edema:  1+ (Barely detectable impression when finger is pressed into skin)  2+ (slight indentation. 15 seconds to rebound)  3+ (deeper indentation. 30 seconds to rebound)  4+ (>30 seconds to rebound)  Wrist/Hand: Handedness:  Right    Left    NA: Comments:  Right  Left    WNL      Limitations      Contractures      Fisting      Tremors      Weak grasp      Poor dexterity      Hand movement non functional      Paralysis         MOBILITY BASE RECOMMENDATIONS and JUSTIFICATION:  MOBILITY BASE  JUSTIFICATION   Manufacturer:   Key mobility Model:                    Catalyst 4          Color: Retro red Seat Width:  18" Seat Depth 18"    Manual mobility base (continue below)    Scooter/POV   Power mobility base   Number of hours per day spent in above selected mobility base: 10+  Typical daily mobility base use Schedule: intermittently during the day    is not a safe, functional ambulator   limitation prevents from completing a MRADL(s) within a reasonable time frame     limitation places at high risk of morbidity or mortality secondary to  the attempts to perform a    MRADL(s)   limitation prevents accomplishing a MRADL(s) entirely   provide independent mobility   equipment is a lifetime medical need   walker or cane inadequate   any type manual wheelchair      inadequate   scooter/POV inadequate       requires dependent mobility          MANUAL MOBILITY       Standard  manual wheelchair  K0001      Arm:    []  both []  right  []  left      Foot:   []  both []  right   []  left  []  self-propels wheelchair  []  will use on regular basis  []  chair fits throughout home  []  willing and motivated to use  []  propels with assistance     []  dependent use   []  Standard hemi-manual wheelchair  K0002      Arm:    []  both []  right  []  left      Foot:   []  both []  right   []  left  []  lower seat height required to foot propel  []  short stature  []  self-propels wheelchair  []  will use on regular basis  []  chair fits throughout home  []  willing and motivated to use   []  propels with assistance  []  dependent use   []  Lightweight manual wheelchair  K0003      Arm:    []  both []  right  []  left      Foot:   []  both  []  right  []  left                   []  hemi height required  []  medical condition and weight of  wheelchair affect ability to self      propel standard manual wheelchair in the residence  []  can and does self-propel (marginal propulsion skills)  []  daily use _________hours  []  chair fits throughout home  []  willing and motivated to use  []  lower seat height required to foot propel  []  short stature   [x]  High strength lightweight manual  wheelchair (Breezy Ultra 4)  K0004     Arm:    [x]  both []  right  []  left     Foot:   [x]  both []  right   []  left                                                                  []  hemi height required [x]  medical condition and weight of wheelchair affect ability to self propel while engaging in frequent MRADL(s) that cannot be performed in a standard or lightweight manual wheelchair  [x]  daily use ____10+_____hours  [x]  chair fits throughout home  [x]  willing and motivated to use  [x]  prevent repetitive use injuries   []  lower seat height required to foot propel  []  short stature    []  Ultra-lightweight manual wheelchair  K0005     Arm:    []  both []  right  []  left     Foot:   []  both []  right  []  left       []   hemi height required  []  heavy duty    Front seat to floor _____ inches      Rear seat to floor _____ inches      Back height _____ inches     Back angle ______ degrees      Front angle _____ degrees  []   full-time manual wheelchair user  []  Requires individualized fitting and optimal adjustments for multiple features that include adjustable axle configuration, fully adjustable center of gravity, wheel camber, seat and back angle, angle of seat slope, which  cannot be accommodated by a K0001 through K0004 manual wheelchair  []  prevent repetitive use injuries  []  daily use_________hours   []  user has high activity patterns that frequently require  them  to go out into the community for the purpose of independently accomplishing high level MRADL activities. Examples of these might include a combination of; shopping, work, school, Photographer, childcare, independently loading and unloading from a vehicle etc.  []  lower seat height required to foot propel  []  short stature  []  heavy duty -  weight over 250lbs   []  Current chair is a K0005   manufacture:___________________  model:_________________  serial#____________________  age:_________    []  First time Z6109 user (complete trial)  K0004 time and # of strokes to propel 30 feet: ________seconds _________strokes  U0454 time and # of strokes to propel 30 feet: ________seconds _________strokes  What was the result of the trial between the K0004 and K0005 manual wheelchair? ___    What features of the K0005 w/c are needed as compared to the K0004 base? Why?___    []  adjustable seat and back angle changes the angle of seat slope of the frame to attain a gravity assisted position for efficient propulsion and proper weight distribution along the frame     []  the front of the wheelchair will be configured higher than the back of the chair to allow gravity to assist the user with postural stability  []  the center of the wheel will be  positioned for stability, safety and efficient propulsion  []  adjustable axle allows for vertical, horizontal, camber and overall width changes  throughout the wheels for adjustment of the client's exact needs and abilities.   []  adjustable axle increases the stability and function of the chair allowing for adjustment of the center of gravity.   []  accommodates the client's anatomical position in the chair maximizing independence in mobility and maneuverability in all environments.   []  create a minimal fixed tilt-in space to assist in positioning.   []  Describe users full-time manual wheelchair activity patterns:___    []  Power assist Comments:  []  prevent repetitive use injuries  []  repetitive strain injury present in    shoulder girdle    []  shoulder pain is (> or =) to 7/10     during manual propulsion       Current Pain _____/10  []  requires conservation of energy to participate in MRADL(s) runable to propel up ramps or curbs using manual wheelchair  []  been K0005 user greater than one year  []  user unwilling to use power      wheelchair (reason): []  less expensive option to power   wheelchair   []  rim activated power assist -      decreased strength   []  Heavy duty manual wheelchair       K0006     Arm:    []  both []  right  []  left     Foot:   []  both []  right  []  left     []  hemi height required    []  Dependent base  []  user exceeds 250lbs  []  non-functional ambulator    []  extreme spasticity  []  over active movement   []  broken frame/hx of repeated     repairs  []  able to self-propel in residence       []  lower seat to floor height required  []  unable to self-propel in residence   []  Extra heavy duty manual wheelchair  [phone]  Arm:    []  both []  right  []  left     Foot:   []  both []  right  []  left     []  hemi height required  []  Dependent base  []  user exceeds 300lbs  []  non-functional ambulator    []  able to self-propel in residence   []  lower seat to floor height  required  []  unable to self-propel in residence     []  Manual wheelchair with tilt [phone]      (Manual "Tilt-n-Space")  []  patient is dependent for transfers  []  patient requires frequent       positioning for pressure relief   []  patient requires frequent      positioning for poor/absent trunk control        []  Stroller Base  []  infant/child   []  unable to propel manual      wheelchair  []  allows for growth  []  non-functional ambulator  []  non-functional UE  []  independent mobility is not a goal at this time    MANUAL FRAME OPTIONS      Push handles  []  extended  rangle adjustable   [x]  standard  []  caregiver access  []  caregiver assist    []  allows "hooking" to enable      increased ability to perform ADLs or maintain balance   []  Angle Adjustable Back  []  postural control  []  control of tone/spasticity  []  accommodation of range of motion  []  UE functional control  []  accommodation for seating system    Rear wheel placement  []  std/fixed rfully adjustableramputee   []  camber ________degree  []  removable rear wheel  []  non-removable rear wheel  Wheel size _______  Wheel style_______________________  []  improved UE access to wheels  []  increase propulsion ability  []  improved stability  []  changing angle in space for      improvement of postural stability  []  remove for transport    []  allow for seating system to fit on      base  []  amputee placement  []  1-arm drive access   r R  r L  []  enable propulsion of manual       wheelchair with one arm    []  amputee placement   Wheel rims/ Hand rims  [x]  Standard    []  Specialized-____ [x]  provide ability to propel manual   [x]  increase self-propulsion with hand wheelchair weakness/decreased grasp     []  Spoke protector/guard   []  prevent hands from getting caught in spokes   Tires:  []  pneumatic  [x]  flat free inserts  []  solid  Style:  [x]  decrease roll resistance              [x]  prevent frequent flats  []  increase shock  absorbency  [x]  decrease maintenance   [x]  decrease pain from road shock    []  decrease spasms from road shock    Wheel Locks:    [x]  push []  pull []  scissor  [x]  lock wheels for transfers  [x]  lock wheels from rolling   Brake/wheel lock extension:  []  R  []  L  []  allow user to operate wheel locks due to decreased reach or strength   Caster housing:  Caster size:                      Style:                                          []   suspension fork   maneuverability    stability of wheelchair    durability   maintenance   angle adjustment for posture   allow for feet to come under        wheelchair base   allows change in seat to floor      height    increase shock absorbency   decrease pain from road shock   decrease spasms from road    shock    Side guards   prevent clothing getting caught in wheel or becoming soiled  rprovide hip and pelvic stability   eliminates contact between body and wheels   limit hand contact with wheels    Anti-tippers       prevent wheelchair from tipping    backward   assist caregiver with curbs     POWER MOBILITY       Scooter/POV     can safely operate    can safely transfer    has adequate trunk stability    cannot functionally propel  manual wheelchair     Power mobility base     non-ambulatory    cannot functionally propel manual wheelchair    cannot functionally and safely      operate scooter/POV   can safely operate power       wheelchair   home is accessible   willing to use power wheelchair     Tilt   Powered tilt on powered chair   Powered tilt on manual chair   Manual tilt on manual chair Comments:   change position for pressure       elief/cannot weight shift    change position against      gravitational force on head and      shoulders    decrease pain   blood pressure management    control autonomic dysreflexia   decrease respiratory distress    management of spasticity   management of low tone   facilitate postural control    rest periods    control edema   increase sitting tolerance    aid with transfers     Recline    Power recline on power chair   Manual recline on manual chair  Comments:     intermittent catheterization   manage spasticity   accommodate femur to back angle   change position for pressure relief/cannot weight shift rhigh risk of pressure sore development   tilt alone does not accomplish     effective pressure relief, maximum pressure relief achieved at -      _______ degrees tilt   _______ degrees recline    difficult to transfer to and from bed  rest periods and sleeping in chair   repositioning for transfers   bring to full recline for ADL care   clothing/diaper changes in chair   gravity PEG tube feeding   head positioning   decrease pain   blood pressure management    control autonomic dysreflexia   decrease respiratory distress   user on ventilator     Elevator on mobility base   Power wheelchair   Scooter   increase Indep in transfers    increase Indep in ADLs     bathroom function and safety   kitchen/cooking function and safety   shopping   raise height for communication at standing level   raise height for eye contact which reduces cervical neck strain and pain   drive at raised height for safety and navigating  crowds  []  Other:   []  Vertical position system  (anterior tilt)     (Drive locks-out)    []  Stand       (Drive enabled)  []  independent weight bearing  []  decrease joint contractures  []  decrease/manage spasticity  []  decrease/manage spasms  []  pressure distribution away from   scapula, sacrum, coccyx, and ischial tuberosity  []  increase digestion and elimination   []  access to counters and cabinets  []  increase reach  []  increase interaction with others at eye level, reduces neck strain  []   increase performance of       MRADL(s)      Power elevating legrest    []  Center mount (Single) 85-170 degrees       []  Standard (Pair) 100-170 degrees  []  position legs at 90 degrees, not available with std power ELR  []  center mount tucks into chair to decrease turning radius in home, not available with std power ELR  []  provide change in position for LE  []  elevate legs during recline    []  maintain placement of feet on      footplate  []  decrease edema  []  improve circulation  []  actuator needed to elevate legrest  []  actuator needed to articulate legrest preventing knees from flexing  []  Increase ground clearance over      curbs  []   STD (pair) independently                     elevate legrest   POWER WHEELCHAIR CONTROLS      Controls/input device  []  Expandable  []  Non-expandable  []  Proportional  []  Right Hand []  Left Hand  []  Non-proportional/switches/head-array  []  Electrical/proximity         []   Mechanical      Manufacturer:___________________   Type:________________________ []  provides access for controlling wheelchair  []  programming for accurate control  []  progressive disease/changing condition  []  required for alternative drive      controls       []  lacks motor control to operate  proportional drive control  []  unable to understand proportional controls  []  limited movement/strength  []  extraneous movement / tremors / ataxic / spastic       []  Upgraded electronics controller/harness    []  Single power (tilt or recline)   []  Expandable    []  Non-expandable plus   []  Multi-power (tilt, recline, power legrest, power seat lift, vertical positioning system, stand)  []  allows input device to communicate with drive motors  []  harness provides necessary connections between the controller, input device, and seat functions     []  needed in order to operate power seat functions through joystick/ input device  []  required for alternative drive controls     []   Enhanced display  []  required to connect all alternative drive controls   []  required for upgraded joystick      (lite-throw, heavy duty, micro)  []  Allows user to see in which mode and drive the wheelchair is set; necessary for alternate controls       []  Upgraded tracking electronics  []  correct tracking when on uneven surfaces makes switch driving more efficient and less fatiguing  []  increase safety when driving  []  increase ability to traverse thresholds    []  Safety / reset / mode switches     Type:    []  Used to change modes and stop the wheelchair when driving     []  [facility]  for joystick / input device/switches  []  swing away for access or transfers   []  attaches joystick / input device / switches to wheelchair   []  provides for consistent access  []  midline for optimal placement    []  Attendant controlled joystick plus     mount  []  safety  []  long distance driving  []  operation of seat functions  []  compliance with transportation regulations    []  Battery  []  required to power (power assist / scooter/ power wc / other):   []  Power inverter (24V to 12V)  []  required for ventilator / respiratory equipment / other:     CHAIR OPTIONS MANUAL & POWER      Armrests   [x]  adjustable height rremovable  []  swing away []  fixed  [x]  flip back  []  reclining  []  full length pads [x]  desk []  tube arms []  gel pads  [x]  provide support with elbow at 90    [x]  remove/flip back/swing away for  transfers  [x]  provide support and positioning of upper body    [x]  allow to come closer to table top  []  remove for access to tables  []  provide support for w/c tray  []  change of height/angles for       variable activities   []  Elbow support / Elbow stop  []  keep elbow positioned on arm pad  []  keep arms from falling off arm pad  during tilt and/or recline   Upper Extremity Support  []  Arm trough  []   R  []   L  Style:  []  swivel mount []  fixed mount   []  posterior hand support  []   tray  []  full  tray  []  joystick cut out  []   R  []   L  Style:  []  decrease gravitational pull on      shoulders  []  provide support to increase UE  function  []  provide hand support in natural    position  []  position flaccid UE  []  decrease subluxation    []  decrease edema       []  manage spasticity   []  provide midline positioning  []  provide work surface  []  placement for AAC/ Computer/ EADL       Hangers/ Legrests   [x]  ___70___ degree  []  Elevating []  articulating  [x]  swing away []  fixed [x]  lift off  []  heavy duty []  adjustable knee angle  []  adjustable calf panel   []  longer extension tube              [x]  provide LE support  [x]  maintain placement of feet on      footplate   [x]  accommodate lower leg length  []  accommodate to hamstring       tightness  [x]  enable transfers  []  provide change in position for LE's  []  elevate legs during recline    []  decrease edema  [x]  durability      Foot support   [x]  footplate []  R []  L [x]  flip up           []  Depth adjustable   []  angle adjustable  []  foot board/one piece    [x]  provide foot support  [x]  accommodate to ankle ROM  [x]  allow foot to go under wheelchair base  [x]  enable transfers     []  Shoe holders  []  position foot    []  decrease / manage spasticity  []  control position of LE  []  stability    []   safety     []  Ankle strap/heel      loops  []  support foot on foot support  []  decrease extraneous movement  []  provide input to heel   []  protect foot     []  Amputee adapter []  R  []  L     Style:                  Size:  []  Provide support for stump/residual extremity    []  Transportation tie-down  []  to provide crash tested tie-down brackets    []  Crutch/cane holder    []  O2 holder    []  IV hanger   []  Ventilator tray/mount    []  stabilize accessory on wheelchair       Component  Justification     [x]  Seat cushion     Axium G  []  accommodate impaired sensation  []  decubitus ulcers present or history  []  unable to shift  weight  []  increase pressure distribution  []  prevent pelvic extension  []  custom required "off-the-shelf"    seat cushion will not accommodate deformity  [x]  stabilize/promote pelvis alignment  [x]  stabilize/promote femur alignment  []  accommodate obliquity  []  accommodate multiple deformity  []  incontinent/accidents  [x]  low maintenance     []  seat mounts                 []  fixed []  removable  []  attach seat platform/cushion to wheelchair frame    []  Seat wedge    []  provide increased aggressiveness of seat shape to decrease sliding  down in the seat  []  accommodate ROM        []  Cover replacement   []  protect back or seat cushion  []  incontinent/accidents    []  Solid seat / insert    []  support cushion to prevent      hammocking  []  allows attachment of cushion to mobility base    []  Lateral pelvic/thigh/hip     support (Guides)     []  decrease abduction  []  accommodate pelvis  []  position upper legs  []  accommodate spasticity  []  removable for transfers     []  Lateral pelvic/thigh      supports mounts  []  fixed   []  swing-away   []  removable  []  mounts lateral pelvic/thigh supports     []  mounts lateral pelvic/thigh supports swing-away or removable for transfers    []  Medial thigh support (Pommel)  [] decrease adduction  [] accommodate ROM  []  remove for transfers   []  alignment      []  Medial thigh   []  fixed      support mounts      []  swing-away   []  removable  []  mounts medial thigh supports   []  Mounts medial supports swing- away or removable for transfers       Component  Justification   []  Back       []  provide posterior trunk support []  facilitate tone  []  provide lumbar/sacral support []  accommodate deformity  []  support trunk in midline   []  custom required "off-the-shelf" back support will not accommodate deformity   []  provide lateral trunk support []  accommodate or decrease tone            []  Back mounts  []  fixed  []  removable  []  attach back rest/cushion  to wheelchair frame   []  Lateral trunk      supports  []  R []  L  []  decrease lateral trunk leaning  []   accommodate asymmetry     contour for increased contact   safety     control of tone     Lateral trunk      supports mounts   fixed   swing-away    removable   mounts lateral trunk supports      Mounts lateral trunk supports swing-away or removable for transfers    Anterior chest      strap, vest      decrease forward movement of shoulder   decrease forward movement of trunk   safety/stability   added abdominal support   trunk alignment   assistance with shoulder control    decrease shoulder elevation     Headrest       provide posterior head support   provide posterior neck support   provide lateral head support   provide anterior head support   support during tilt and recline   improve feeding      improve respiration   placement of switches   safety     accommodate ROM    accommodate tone   improve visual orientation    Headrest            fixed  removable  flip down      Mounting hardware    swing-away laterals/switches   mount headrest    mounts headrest flip down or  removable for transfers   mount headrest swing-away laterals    mount switches      Neck Support     decrease neck rotation   decrease forward neck flexion   Pelvic Positioner     std hip belt           padded hip belt   dual pull hip belt   four point hip belt   stabilize tone   decrease falling out of chair   prevent excessive extension   special pull angle to control      rotation   pad for protection over boney   prominence   promote comfort     Essential needs        bag/pouch    medicines  special food rorthotics  clothing changes   diapers   catheter/hygiene  ostomy supplies   The above equipment has a life- long use expectancy.  Growth and changes in medical and/or  functional conditions would be the exceptions.   SUMMARY:  Why mobility device was selected; include why a lower level device is not appropriate:   ASSESSMENT:  Functional test: Timed up and go test: 43 sec, use of RW and prosthetic leg on L side  CLINICAL IMPRESSION: Patient is a 61 y.o. male who was seen today for physical therapy evaluation and treatment for wheelchair evaluation due to decreased mobility. Patient demonstrated 43 sec with TUG with RW which places him at high risk for fall. Pt is unable to fully WB through R LE as he does not have heel to toe contact, rather he has toe contact during R stance phase due to R knee pain. Pt uses UE for support excessively with his RW. Patient demonstrates WFL in UE and bil LE. Patient is safe Ambulator with assistive device but will benefit from manual wheelchair to improve community access due to decreased endurance and increased pain in R knee with prolonged standing and walking..    OBJECTIVE IMPAIRMENTS Abnormal gait, decreased activity tolerance, decreased balance, decreased endurance, decreased mobility, difficulty walking, decreased ROM, decreased strength, decreased safety awareness, hypomobility, impaired flexibility,  prosthetic dependency , and pain.   ACTIVITY LIMITATIONS carrying, lifting, standing, squatting, stairs, and transfers  PARTICIPATION LIMITATIONS: cleaning, driving, shopping, community activity, and church  PERSONAL FACTORS Past/current experiences, Time since onset of injury/illness/exacerbation, and 1-2 comorbidities: L BKA, R TKA  are also affecting patient's functional outcome.   REHAB POTENTIAL: Good  CLINICAL DECISION MAKING: Stable/uncomplicated  EVALUATION COMPLEXITY: Low                                   GOALS: One time visit. No goals established.    PLAN: PT FREQUENCY: one time visit    Kerrie Pleasure, PT 05/06/2022, 1:13 PM    I concur with the above findings and recommendations  of the therapist:  Physician name printed:         Physician's signature:      Date:

## 2022-05-21 ENCOUNTER — Ambulatory Visit: Payer: Medicare Other | Admitting: Podiatry

## 2022-06-04 ENCOUNTER — Ambulatory Visit: Payer: Medicare Other | Admitting: Podiatry

## 2022-06-05 DIAGNOSIS — M1611 Unilateral primary osteoarthritis, right hip: Secondary | ICD-10-CM | POA: Diagnosis not present

## 2022-06-05 DIAGNOSIS — M25551 Pain in right hip: Secondary | ICD-10-CM | POA: Diagnosis not present

## 2022-06-05 DIAGNOSIS — M25561 Pain in right knee: Secondary | ICD-10-CM | POA: Diagnosis not present

## 2022-06-09 ENCOUNTER — Other Ambulatory Visit: Payer: Self-pay | Admitting: Critical Care Medicine

## 2022-06-09 DIAGNOSIS — E782 Mixed hyperlipidemia: Secondary | ICD-10-CM

## 2022-06-09 DIAGNOSIS — M25551 Pain in right hip: Secondary | ICD-10-CM | POA: Diagnosis not present

## 2022-06-09 NOTE — Telephone Encounter (Signed)
Rx 02/17/22 #90 1RF- too soon Requested Prescriptions  Pending Prescriptions Disp Refills   atorvastatin (LIPITOR) 20 MG tablet [Pharmacy Med Name: ATORVASTATIN 20 MG TABLET] 90 tablet 1    Sig: TAKE 1 TABLET BY MOUTH EVERY DAY IN THE EVENING     Cardiovascular:  Antilipid - Statins Failed - 06/09/2022  9:46 AM      Failed - Lipid Panel in normal range within the last 12 months    Cholesterol, Total  Date Value Ref Range Status  02/17/2022 206 (H) 100 - 199 mg/dL Final   LDL Chol Calc (NIH)  Date Value Ref Range Status  02/17/2022 133 (H) 0 - 99 mg/dL Final   HDL  Date Value Ref Range Status  02/17/2022 47 >39 mg/dL Final   Triglycerides  Date Value Ref Range Status  02/17/2022 145 0 - 149 mg/dL Final         Passed - Patient is not pregnant      Passed - Valid encounter within last 12 months    Recent Outpatient Visits           2 months ago Essential hypertension   [facility] Cornelius, Shea Stakes, NP   3 months ago Primary hypertension   [facility] Storm Frisk, MD   3 years ago Pain in the groin, left   [facility] Cain Saupe, MD   3 years ago Influenza vaccination given   [facility] Drucilla Chalet, RPH-CPP   3 years ago Essential hypertension   [facility] Blairs, Shea Stakes, NP       Future Appointments             In 1 week Hoy Register, MD [facility]

## 2022-06-10 ENCOUNTER — Telehealth: Payer: Self-pay | Admitting: Emergency Medicine

## 2022-06-10 NOTE — Telephone Encounter (Signed)
Copied from CRM [id number]. Topic: General - Other >> Jun 10, 2022  1:56 PM Clide Dales wrote: Patient would like an order for a shower chair sent to [employer].

## 2022-06-13 ENCOUNTER — Other Ambulatory Visit: Payer: Self-pay | Admitting: Nurse Practitioner

## 2022-06-13 DIAGNOSIS — S88112S Complete traumatic amputation at level between knee and ankle, left lower leg, sequela: Secondary | ICD-10-CM

## 2022-06-13 NOTE — Telephone Encounter (Signed)
Order for DME in chart.

## 2022-06-15 NOTE — Telephone Encounter (Signed)
Printed and will fax when signed.

## 2022-06-19 ENCOUNTER — Telehealth: Payer: Self-pay | Admitting: Emergency Medicine

## 2022-06-19 NOTE — Telephone Encounter (Signed)
Copied from CRM [id number]. Topic: General - Other >> Jun 19, 2022  9:57 AM Macon Large wrote: Reason for CRM: Ethelene Browns with [employer] reports that the order for the shower chair will be cancelled due to pt informing them that it was not needed. Cb# [phone]

## 2022-06-22 ENCOUNTER — Ambulatory Visit: Payer: Medicare Other | Admitting: Family Medicine

## 2022-06-22 ENCOUNTER — Ambulatory Visit: Payer: Medicare Other | Admitting: Nurse Practitioner

## 2022-06-23 DIAGNOSIS — S88112S Complete traumatic amputation at level between knee and ankle, left lower leg, sequela: Secondary | ICD-10-CM | POA: Diagnosis not present

## 2022-06-23 DIAGNOSIS — R269 Unspecified abnormalities of gait and mobility: Secondary | ICD-10-CM | POA: Diagnosis not present

## 2022-07-01 ENCOUNTER — Encounter: Payer: Self-pay | Admitting: Podiatry

## 2022-07-01 ENCOUNTER — Ambulatory Visit (INDEPENDENT_AMBULATORY_CARE_PROVIDER_SITE_OTHER): Payer: Medicare Other | Admitting: Podiatry

## 2022-07-01 DIAGNOSIS — B351 Tinea unguium: Secondary | ICD-10-CM

## 2022-07-01 NOTE — Progress Notes (Signed)
Subjective:   Patient ID: Carl Vang, male   DOB: 61 y.o.   MRN: [id number]   HPI Patient presents with thick yellow brittle nails 1-5 right foot with history of amputation left   ROS      Objective:  Physical Exam  Neuro vas alert status unchanged right thick yellow brittle nailbeds 1-5 right     Assessment:  Mycotic nail infection beds 1-5 right     Plan:  Reviewed condition ultimately nail removal may be necessary and I did discuss fungus but at this point I just went ahead did courtesy debridement and he will be seen back as needed and hopefully this will help his symptoms

## 2022-07-03 ENCOUNTER — Other Ambulatory Visit: Payer: Self-pay | Admitting: Nurse Practitioner

## 2022-07-03 DIAGNOSIS — M1711 Unilateral primary osteoarthritis, right knee: Secondary | ICD-10-CM

## 2022-07-03 NOTE — Telephone Encounter (Unsigned)
Copied from CRM [id number]. Topic: General - Other >> Jul 03, 2022  9:59 AM Everette C wrote: Reason for CRM: Medication Refill - Medication: acetaminophen-codeine (TYLENOL #3) 300-30 MG tablet [[id number]]  Has the patient contacted their pharmacy? No. (Agent: If no, request that the patient contact the pharmacy for the refill. If patient does not wish to contact the pharmacy document the reason why and proceed with request.) (Agent: If yes, when and what did the pharmacy advise?)  Preferred Pharmacy (with phone number or street name): [id number] - [place], [place] - [address] AT [facility] [address] [place] [address] Phone: [phone] Fax: [phone] Hours: Open 24 hours   Has the patient been seen for an appointment in the last year OR does the patient have an upcoming appointment? Yes.    Agent: Please be advised that RX refills may take up to 3 business days. We ask that you follow-up with your pharmacy.

## 2022-07-04 NOTE — Telephone Encounter (Signed)
Requested medication (s) are due for refill today:Yes  Requested medication (s) are on the active medication list: Yes  Last refill:  03/23/22  Future visit scheduled: Yes  Notes to clinic:  Unable to refill per protocol, cannot delegate.      Requested Prescriptions  Pending Prescriptions Disp Refills   acetaminophen-codeine (TYLENOL #3) 300-30 MG tablet 30 tablet 0    Sig: Take 1 tablet by mouth every 12 (twelve) hours as needed for moderate pain.     Not Delegated - Analgesics:  Opioid Agonist Combinations 2 Failed - 07/03/2022 10:04 AM      Failed - This refill cannot be delegated      Failed - Urine Drug Screen completed in last 360 days      Failed - Valid encounter within last 3 months    Recent Outpatient Visits           3 months ago Essential hypertension   [facility] Fleming, Zelda W, NP   4 months ago Primary hypertension   [facility] [facility] Wright, Patrick E, MD   3 years ago Pain in the groin, left   [facility] Fulp, Cammie, MD   3 years ago Influenza vaccination given   [facility] Van Ausdall, Stephen L, RPH-CPP   3 years ago Essential hypertension   [facility] Fleming, Zelda W, NP       Future Appointments             In 1 week Newlin, Enobong, MD [facility]            Passed - Cr in normal range and within 360 days    Creat  Date Value Ref Range Status  04/07/2016 0.74 0.70 - 1.33 mg/dL Final    Comment:      For patients > or = 61 years of age: The upper reference limit for Creatinine is approximately 13% higher for people identified as African-American.      Creatinine, Ser  Date Value Ref Range Status  02/17/2022 0.84 0.76 - 1.27 mg/dL Final         Passed - eGFR is 10 or above and within 360 days    GFR, Est African American  Date Value Ref Range  Status  04/07/2016 >89 >=60 mL/min Final   GFR calc Af Amer  Date Value Ref Range Status  02/26/2019 >60 >60 mL/min Final   GFR, Est Non African American  Date Value Ref Range Status  04/07/2016 >89 >=60 mL/min Final   GFR calc non Af Amer  Date Value Ref Range Status  02/26/2019 >60 >60 mL/min Final   eGFR  Date Value Ref Range Status  02/17/2022 100 >59 mL/min/1.73 Final         Passed - Patient is not pregnant          

## 2022-07-07 DIAGNOSIS — S88112S Complete traumatic amputation at level between knee and ankle, left lower leg, sequela: Secondary | ICD-10-CM | POA: Diagnosis not present

## 2022-07-07 DIAGNOSIS — Z96659 Presence of unspecified artificial knee joint: Secondary | ICD-10-CM | POA: Diagnosis not present

## 2022-07-07 MED ORDER — ACETAMINOPHEN-CODEINE 300-30 MG PO TABS: 1.0000 | ORAL_TABLET | Freq: Two times a day (BID) | ORAL | 0 refills | Status: DC | PRN

## 2022-07-08 ENCOUNTER — Telehealth: Payer: Self-pay | Admitting: Nurse Practitioner

## 2022-07-08 NOTE — Telephone Encounter (Signed)
Pt. Checking on pain medication refill. Instructed refill sent 07/07/21 to his pharmacy. Also asking if a referral to orthopedics was sent. Please advise pt.

## 2022-07-15 ENCOUNTER — Encounter: Payer: Self-pay | Admitting: Family Medicine

## 2022-07-15 ENCOUNTER — Ambulatory Visit: Payer: Medicare Other | Attending: Nurse Practitioner | Admitting: Family Medicine

## 2022-07-15 VITALS — BP 119/65 | HR 81 | Temp 98.1°F | Ht 66.0 in | Wt 175.0 lb

## 2022-07-15 DIAGNOSIS — E782 Mixed hyperlipidemia: Secondary | ICD-10-CM | POA: Diagnosis not present

## 2022-07-15 DIAGNOSIS — I10 Essential (primary) hypertension: Secondary | ICD-10-CM

## 2022-07-15 DIAGNOSIS — H6121 Impacted cerumen, right ear: Secondary | ICD-10-CM

## 2022-07-15 DIAGNOSIS — M1711 Unilateral primary osteoarthritis, right knee: Secondary | ICD-10-CM | POA: Diagnosis not present

## 2022-07-15 MED ORDER — MELOXICAM 15 MG PO TABS: 15.0000 mg | ORAL_TABLET | Freq: Every day | ORAL | 1 refills | Status: DC

## 2022-07-15 MED ORDER — ACETAMINOPHEN-CODEINE 300-30 MG PO TABS: 1.0000 | ORAL_TABLET | Freq: Two times a day (BID) | ORAL | 0 refills | Status: DC | PRN

## 2022-07-15 MED ORDER — ATORVASTATIN CALCIUM 20 MG PO TABS: 20.0000 mg | ORAL_TABLET | Freq: Every evening | ORAL | 1 refills | Status: DC

## 2022-07-15 MED ORDER — CHLORTHALIDONE 25 MG PO TABS: 25.0000 mg | ORAL_TABLET | Freq: Every day | ORAL | 1 refills | Status: DC

## 2022-07-15 MED ORDER — AMLODIPINE BESYLATE 10 MG PO TABS: 10.0000 mg | ORAL_TABLET | Freq: Every day | ORAL | 1 refills | Status: DC

## 2022-07-15 NOTE — Progress Notes (Signed)
Subjective:  Patient ID: Carl Vang, male    DOB: April 06, 1961  Age: 62 y.o. MRN: [id number]  CC: Hypertension   HPI Carl Vang is a 62 y.o. year old male patient of Bertram Denver, FNP with a history of hypertension, right knee osteoarthritis (status post right total knee arthroplasty), left BKA Here for an office visit.  Interval History:  Last seen by PCP in 03/2022. He needs refill on Tylenol #3 due to pain in his legs which he takes along with Mobic. He denies illicit substance use. Last dose of Tylenol #3 was taken 1 month ago and he only takes it when he is in pain not daily.  Endorses adherence with his antihypertensive and his statin and denies any adverse effects from his medications. He currently has impacted cerumen in his right ear and would like his ears checked as this does affect his hearing.  In the past he has had ear irrigation done. Past Medical History:  Diagnosis Date   Hyperlipidemia    Hypertension    Osteoarthritis of knee    Prediabetes     Past Surgical History:  Procedure Laterality Date   COLONOSCOPY WITH PROPOFOL N/A 08/06/2014   Procedure: COLONOSCOPY WITH PROPOFOL;  Surgeon: Charolett Bumpers, MD;  Location: [facility];  Service: Endoscopy;  Laterality: N/A;   left leg below knee amputation   2003   in accident at work    MOUTH SURGERY     TOTAL KNEE ARTHROPLASTY Right 04/08/2018   Procedure: RIGHT TOTAL KNEE ARTHROPLASTY;  Surgeon: Jodi Geralds, MD;  Location: [facility];  Service: Orthopedics;  Laterality: Right;    Family History  Problem Relation Age of Onset   Diabetes Mother    Hypertension Mother    Cancer Mother    Stroke Mother    Diabetes Father    Hypertension Father    Diabetes Daughter    Hypertension Daughter    Heart disease Daughter     Social History   Socioeconomic History   Marital status: Single    Spouse name: Not on file   Number of children: Not on file   Years of education: Not on file   Highest education  level: Not on file  Occupational History   Not on file  Tobacco Use   Smoking status: Never   Smokeless tobacco: Never  Vaping Use   Vaping Use: Never used  Substance and Sexual Activity   Alcohol use: Yes   Drug use: No   Sexual activity: Yes  Other Topics Concern   Not on file  Social History Narrative   Not on file   Social Determinants of Health   Financial Resource Strain: Not on file  Food Insecurity: Not on file  Transportation Needs: Not on file  Physical Activity: Not on file  Stress: Not on file  Social Connections: Not on file    No Known Allergies  Outpatient Medications Prior to Visit  Medication Sig Dispense Refill   acetaminophen-codeine (TYLENOL #3) 300-30 MG tablet Take 1 tablet by mouth every 12 (twelve) hours as needed for moderate pain. 30 tablet 0   amLODipine (NORVASC) 10 MG tablet Take 1 tablet (10 mg total) by mouth daily. 90 tablet 2   atorvastatin (LIPITOR) 20 MG tablet Take 1 tablet (20 mg total) by mouth every evening. 90 tablet 1   chlorthalidone (HYGROTON) 25 MG tablet Take 1 tablet (25 mg total) by mouth daily. 90 tablet 2   meloxicam (MOBIC) 15  MG tablet TAKE 1 TABLET (15 MG TOTAL) BY MOUTH DAILY. 90 tablet 0   No facility-administered medications prior to visit.     ROS Review of Systems  Constitutional:  Negative for activity change and appetite change.  HENT:  Negative for sinus pressure and sore throat.   Respiratory:  Negative for chest tightness, shortness of breath and wheezing.   Cardiovascular:  Negative for chest pain and palpitations.  Gastrointestinal:  Negative for abdominal distention, abdominal pain and constipation.  Genitourinary: Negative.   Musculoskeletal:        See HPI  Psychiatric/Behavioral:  Negative for behavioral problems and dysphoric mood.     Objective:  BP 119/65   Pulse 81   Temp 98.1 F (36.7 C) (Oral)   Ht 5\' 6"  (1.676 m)   Wt 175 lb (79.4 kg)   SpO2 98%   BMI 28.25 kg/m      07/15/2022     3:46 PM 03/23/2022    9:40 AM 02/17/2022    1:53 PM  BP/Weight  Systolic BP 761 607 371  Diastolic BP 65 76 80  Wt. (Lbs) 175 169.2 167  BMI 28.25 kg/m2 28.16 kg/m2 26.95 kg/m2      Physical Exam Constitutional:      Appearance: He is well-developed.  HENT:     Right Ear: There is impacted cerumen.     Left Ear: There is no impacted cerumen.  Cardiovascular:     Rate and Rhythm: Normal rate.     Heart sounds: Normal heart sounds. No murmur heard. Pulmonary:     Effort: Pulmonary effort is normal.     Breath sounds: Normal breath sounds. No wheezing or rales.  Chest:     Chest wall: No tenderness.  Abdominal:     General: Bowel sounds are normal. There is no distension.     Palpations: Abdomen is soft. There is no mass.     Tenderness: There is no abdominal tenderness.  Musculoskeletal:     Right lower leg: No edema.     Comments: Reduced flexion and extension of right knee  Neurological:     Mental Status: He is alert and oriented to person, place, and time.     Comments: Reduced strength in RLE  Psychiatric:        Mood and Affect: Mood normal.        Latest Ref Rng & Units 02/17/2022    2:58 PM 02/26/2019   11:55 PM 05/28/2018    1:31 PM  CMP  Glucose 70 - 99 mg/dL 96  107    BUN 8 - 27 mg/dL 8  12    Creatinine 0.76 - 1.27 mg/dL 0.84  0.96    Sodium 134 - 144 mmol/L 142  137    Potassium 3.5 - 5.2 mmol/L 4.7  3.3    Chloride 96 - 106 mmol/L 104  101    CO2 20 - 29 mmol/L 22  25    Calcium 8.6 - 10.2 mg/dL 9.3  9.6    Total Protein 6.0 - 8.5 g/dL 5.9  6.9  7.4   Total Bilirubin 0.0 - 1.2 mg/dL 0.2  0.7  1.0   Alkaline Phos 44 - 121 IU/L 63  60  68   AST 0 - 40 IU/L 18  22  18    ALT 0 - 44 IU/L 13  17  16      Lipid Panel     Component Value Date/Time   CHOL 206 (H)  02/17/2022 1458   TRIG 145 02/17/2022 1458   HDL 47 02/17/2022 1458   CHOLHDL 4.4 02/17/2022 1458   CHOLHDL 3.7 04/07/2016 1643   VLDL 29 04/07/2016 1643   LDLCALC 133 (H) 02/17/2022  1458    CBC    Component Value Date/Time   WBC 9.8 02/17/2022 1458   WBC 9.1 02/26/2019 2355   RBC 4.62 02/17/2022 1458   RBC 4.64 02/26/2019 2355   HGB 13.4 02/17/2022 1458   HCT 40.6 02/17/2022 1458   PLT 348 02/17/2022 1458   MCV 88 02/17/2022 1458   MCH 29.0 02/17/2022 1458   MCH 28.4 02/26/2019 2355   MCHC 33.0 02/17/2022 1458   MCHC 33.5 02/26/2019 2355   RDW 12.6 02/17/2022 1458   LYMPHSABS 2.9 02/17/2022 1458   MONOABS 0.4 04/04/2018 1450   EOSABS 0.5 (H) 02/17/2022 1458   BASOSABS 0.1 02/17/2022 1458    Lab Results  Component Value Date   HGBA1C 5.9 (H) 02/17/2022    Assessment & Plan:  1. Primary osteoarthritis of right knee Stable PDMP reviewed with no evidence of aberrant behavior - acetaminophen-codeine (TYLENOL #3) 300-30 MG tablet; Take 1 tablet by mouth every 12 (twelve) hours as needed for moderate pain.  Dispense: 30 tablet; Refill: 0 - meloxicam (MOBIC) 15 MG tablet; Take 1 tablet (15 mg total) by mouth daily.  Dispense: 90 tablet; Refill: 1  2. Essential hypertension Controlled Counseled on blood pressure goal of less than 130/80, low-sodium, DASH diet, medication compliance, 150 minutes of moderate intensity exercise per week. Discussed medication compliance, adverse effects. - CMP14+EGFR - amLODipine (NORVASC) 10 MG tablet; Take 1 tablet (10 mg total) by mouth daily.  Dispense: 90 tablet; Refill: 1 - chlorthalidone (HYGROTON) 25 MG tablet; Take 1 tablet (25 mg total) by mouth daily.  Dispense: 90 tablet; Refill: 1  3. Mixed hyperlipidemia Uncontrolled Continue statin He needs repeat lipids at next visit Low-cholesterol diet - atorvastatin (LIPITOR) 20 MG tablet; Take 1 tablet (20 mg total) by mouth every evening.  Dispense: 90 tablet; Refill: 1  4. Impacted cerumen of right ear Ear irrigation performed    Meds ordered this encounter  Medications   acetaminophen-codeine (TYLENOL #3) 300-30 MG tablet    Sig: Take 1 tablet by mouth every  12 (twelve) hours as needed for moderate pain.    Dispense:  30 tablet    Refill:  0   amLODipine (NORVASC) 10 MG tablet    Sig: Take 1 tablet (10 mg total) by mouth daily.    Dispense:  90 tablet    Refill:  1   atorvastatin (LIPITOR) 20 MG tablet    Sig: Take 1 tablet (20 mg total) by mouth every evening.    Dispense:  90 tablet    Refill:  1   chlorthalidone (HYGROTON) 25 MG tablet    Sig: Take 1 tablet (25 mg total) by mouth daily.    Dispense:  90 tablet    Refill:  1   meloxicam (MOBIC) 15 MG tablet    Sig: Take 1 tablet (15 mg total) by mouth daily.    Dispense:  90 tablet    Refill:  1    Follow-up: No follow-ups on file.       Charlott Rakes, MD, FAAFP. [facility] [place], [place]   07/15/2022, 6:16 PM

## 2022-07-16 LAB — CMP14+EGFR
ALT: 17 IU/L (ref 0–44)
AST: 20 IU/L (ref 0–40)
Albumin/Globulin Ratio: 2 (ref 1.2–2.2)
Albumin: 4.1 g/dL (ref 3.9–4.9)
Alkaline Phosphatase: 78 IU/L (ref 44–121)
BUN/Creatinine Ratio: 8 — ABNORMAL LOW (ref 10–24)
BUN: 7 mg/dL — ABNORMAL LOW (ref 8–27)
Bilirubin Total: 0.3 mg/dL (ref 0.0–1.2)
CO2: 28 mmol/L (ref 20–29)
Calcium: 9.4 mg/dL (ref 8.6–10.2)
Chloride: 99 mmol/L (ref 96–106)
Creatinine, Ser: 0.87 mg/dL (ref 0.76–1.27)
Globulin, Total: 2.1 g/dL (ref 1.5–4.5)
Glucose: 95 mg/dL (ref 70–99)
Potassium: 4.3 mmol/L (ref 3.5–5.2)
Sodium: 140 mmol/L (ref 134–144)
Total Protein: 6.2 g/dL (ref 6.0–8.5)
eGFR: 98 mL/min/{1.73_m2} (ref >59)

## 2022-07-22 ENCOUNTER — Telehealth: Payer: Self-pay | Admitting: Nurse Practitioner

## 2022-07-22 NOTE — Telephone Encounter (Signed)
Caller checking on the status of  surgical clearance form faxed on 07/15/2022 to [phone].   Caller re faxing form today, please note when received.   Requesting surgical clearance for right total hip replacement.   Caller would like forms expedited so they can schedule patient in February.   Caller requesting a call back regarding the status.

## 2022-07-22 NOTE — Telephone Encounter (Signed)
Yes, I will fax when Ms. Raul Del has signed it.

## 2022-07-22 NOTE — Telephone Encounter (Signed)
Thank you! I will let the patient know.

## 2022-07-22 NOTE — Telephone Encounter (Signed)
Spoke with patient. Patient made aware the PCP is in the process of completing forms and will fax them when completed.

## 2022-07-23 ENCOUNTER — Telehealth: Payer: Self-pay | Admitting: Nurse Practitioner

## 2022-07-23 DIAGNOSIS — M25551 Pain in right hip: Secondary | ICD-10-CM | POA: Diagnosis not present

## 2022-07-23 NOTE — Telephone Encounter (Signed)
Pt stated he needs some iron to be called in for him. He stated that he was advised while getting ready for his surgery that his iron is very low.  Please advise.    [id number] [address], [place] [address]  Phone: [phone] Fax: [phone]  Hours: Not open 24 hours

## 2022-07-23 NOTE — Telephone Encounter (Signed)
I do not have any recent labs on file and last CBC was normal. He can take over the counter MVI with iron but at this time prescription is not warranted

## 2022-07-23 NOTE — Telephone Encounter (Signed)
Routing to CMA 

## 2022-07-24 DIAGNOSIS — R269 Unspecified abnormalities of gait and mobility: Secondary | ICD-10-CM | POA: Diagnosis not present

## 2022-07-24 DIAGNOSIS — S88112S Complete traumatic amputation at level between knee and ankle, left lower leg, sequela: Secondary | ICD-10-CM | POA: Diagnosis not present

## 2022-07-24 NOTE — Telephone Encounter (Signed)
Patient identified by name and date of birth.  Patient aware of response.   

## 2022-08-03 NOTE — Telephone Encounter (Signed)
Pt called for update on paperwork, wants call back to discuss status of his request [phone]

## 2022-08-04 NOTE — Telephone Encounter (Signed)
[facility] called about the surgical clearance / pts surgery can't be scheduled until they have this clearance and pt keeps calling them frustrated / please fax clearance back to [phone] asap

## 2022-08-05 NOTE — Telephone Encounter (Signed)
Needs office visit/labs EKG/  The office need to fax clearance forms to our station fax number

## 2022-08-05 NOTE — Telephone Encounter (Signed)
Scheduled

## 2022-08-07 ENCOUNTER — Other Ambulatory Visit: Payer: Self-pay

## 2022-08-07 ENCOUNTER — Encounter: Payer: Self-pay | Admitting: Nurse Practitioner

## 2022-08-07 ENCOUNTER — Ambulatory Visit: Payer: 59 | Attending: Nurse Practitioner | Admitting: Nurse Practitioner

## 2022-08-07 VITALS — BP 120/70 | HR 79 | Ht 65.0 in | Wt 177.0 lb

## 2022-08-07 DIAGNOSIS — Z01818 Encounter for other preprocedural examination: Secondary | ICD-10-CM | POA: Diagnosis not present

## 2022-08-07 DIAGNOSIS — Z713 Dietary counseling and surveillance: Secondary | ICD-10-CM | POA: Insufficient documentation

## 2022-08-07 DIAGNOSIS — R7303 Prediabetes: Secondary | ICD-10-CM | POA: Insufficient documentation

## 2022-08-07 DIAGNOSIS — Z862 Personal history of diseases of the blood and blood-forming organs and certain disorders involving the immune mechanism: Secondary | ICD-10-CM | POA: Diagnosis not present

## 2022-08-07 DIAGNOSIS — Z23 Encounter for immunization: Secondary | ICD-10-CM | POA: Insufficient documentation

## 2022-08-07 MED ORDER — ZOSTER VAC RECOMB ADJUVANTED 50 MCG/0.5ML IM SUSR: 0.5000 mL | Freq: Once | INTRAMUSCULAR | 0 refills | Status: AC

## 2022-08-07 NOTE — Progress Notes (Signed)
Assessment & Plan:  Carl Vang was seen today for pre-op exam.  Diagnoses and all orders for this visit:  Preoperative clearance -     Hemoglobin A1c -     CMP14+EGFR -     CBC with Differential  Need for shingles vaccine -     Zoster Vaccine Adjuvanted [facility]) injection; Inject 0.5 mLs into the muscle once for 1 dose.  Prediabetes -     Hemoglobin A1c -     CMP14+EGFR  History of anemia -     CBC with Differential    Patient has been counseled on age-appropriate routine health concerns for screening and prevention. These are reviewed and up-to-date. Referrals have been placed accordingly. Immunizations are up-to-date or declined.    Subjective:   Chief Complaint  Patient presents with   Pre-op Exam   HPI Carl Vang 62 y.o. male presents to office today for surgical clearance.   He is awaiting Right THA.   EKG is normal today. Blood pressure normal. Based on exam today he meets for surgical clearance. Awaiting lab results prior to [place].  BP Readings from Last 3 Encounters:  08/07/22 120/70  07/15/22 119/65  03/23/22 124/76     Review of Systems  Constitutional:  Negative for fever, malaise/fatigue and weight loss.  HENT: Negative.  Negative for nosebleeds.   Eyes: Negative.  Negative for blurred vision, double vision and photophobia.  Respiratory: Negative.  Negative for cough and shortness of breath.   Cardiovascular: Negative.  Negative for chest pain, palpitations and leg swelling.  Gastrointestinal: Negative.  Negative for heartburn, nausea and vomiting.  Musculoskeletal: Negative.  Negative for myalgias.  Neurological: Negative.  Negative for dizziness, focal weakness, seizures and headaches.  Psychiatric/Behavioral: Negative.  Negative for suicidal ideas.     Past Medical History:  Diagnosis Date   Hyperlipidemia    Hypertension    Osteoarthritis of knee    Prediabetes     Past Surgical History:  Procedure Laterality Date   COLONOSCOPY WITH  PROPOFOL N/A 08/06/2014   Procedure: COLONOSCOPY WITH PROPOFOL;  Surgeon: Garlan Fair, MD;  Location: [facility];  Service: Endoscopy;  Laterality: N/A;   left leg below knee amputation   2003   in accident at work    [place] Right 04/08/2018   Procedure: RIGHT TOTAL KNEE ARTHROPLASTY;  Surgeon: Dorna Leitz, MD;  Location: [facility];  Service: Orthopedics;  Laterality: Right;    Family History  Problem Relation Age of Onset   Diabetes Mother    Hypertension Mother    Cancer Mother    Stroke Mother    Diabetes Father    Hypertension Father    Diabetes Daughter    Hypertension Daughter    Heart disease Daughter     Social History Reviewed with no changes to be made today.   Outpatient Medications Prior to Visit  Medication Sig Dispense Refill   acetaminophen-codeine (TYLENOL #3) 300-30 MG tablet Take 1 tablet by mouth every 12 (twelve) hours as needed for moderate pain. 30 tablet 0   amLODipine (NORVASC) 10 MG tablet Take 1 tablet (10 mg total) by mouth daily. 90 tablet 1   atorvastatin (LIPITOR) 20 MG tablet Take 1 tablet (20 mg total) by mouth every evening. 90 tablet 1   chlorthalidone (HYGROTON) 25 MG tablet Take 1 tablet (25 mg total) by mouth daily. 90 tablet 1   meloxicam (MOBIC) 15 MG tablet Take 1 tablet (15  mg total) by mouth daily. 90 tablet 1   No facility-administered medications prior to visit.    No Known Allergies     Objective:    BP 120/70   Pulse 79   Ht 5\' 5"  (1.651 m)   Wt 177 lb (80.3 kg)   SpO2 96%   BMI 29.45 kg/m  Wt Readings from Last 3 Encounters:  08/07/22 177 lb (80.3 kg)  07/15/22 175 lb (79.4 kg)  03/23/22 169 lb 3.2 oz (76.7 kg)    Physical Exam Vitals and nursing note reviewed.  Constitutional:      Appearance: He is well-developed.  HENT:     Head: Normocephalic and atraumatic.  Cardiovascular:     Rate and Rhythm: Normal rate and regular rhythm.     Heart sounds: Normal heart sounds. No  murmur heard.    No friction rub. No gallop.  Pulmonary:     Effort: Pulmonary effort is normal. No tachypnea or respiratory distress.     Breath sounds: Normal breath sounds. No decreased breath sounds, wheezing, rhonchi or rales.  Chest:     Chest wall: No tenderness.  Abdominal:     General: Bowel sounds are normal.     Palpations: Abdomen is soft.  Musculoskeletal:        General: Normal range of motion.     Cervical back: Normal range of motion.     Left Lower Extremity: Left leg is amputated below knee.  Skin:    General: Skin is warm and dry.  Neurological:     Mental Status: He is alert and oriented to person, place, and time.     Coordination: Coordination normal.  Psychiatric:        Behavior: Behavior normal. Behavior is cooperative.        Thought Content: Thought content normal.        Judgment: Judgment normal.          Patient has been counseled extensively about nutrition and exercise as well as the importance of adherence with medications and regular follow-up. The patient was given clear instructions to go to ER or return to medical center if symptoms don't improve, worsen or new problems develop. The patient verbalized understanding.   Follow-up: Return in about 3 months (around 11/05/2022).   Gildardo Pounds, FNP-BC [facility] and [facility] [place], [place]   08/07/2022, 6:05 PM

## 2022-08-08 LAB — CMP14+EGFR
ALT: 11 IU/L (ref 0–44)
AST: 21 IU/L (ref 0–40)
Albumin/Globulin Ratio: 1.9 (ref 1.2–2.2)
Albumin: 4 g/dL (ref 3.9–4.9)
Alkaline Phosphatase: 68 IU/L (ref 44–121)
BUN/Creatinine Ratio: 16 (ref 10–24)
BUN: 13 mg/dL (ref 8–27)
Bilirubin Total: 0.2 mg/dL (ref 0.0–1.2)
CO2: 26 mmol/L (ref 20–29)
Calcium: 9.4 mg/dL (ref 8.6–10.2)
Chloride: 102 mmol/L (ref 96–106)
Creatinine, Ser: 0.82 mg/dL (ref 0.76–1.27)
Globulin, Total: 2.1 g/dL (ref 1.5–4.5)
Glucose: 90 mg/dL (ref 70–99)
Potassium: 4.4 mmol/L (ref 3.5–5.2)
Sodium: 141 mmol/L (ref 134–144)
Total Protein: 6.1 g/dL (ref 6.0–8.5)
eGFR: 100 mL/min/{1.73_m2} (ref >59)

## 2022-08-08 LAB — CBC WITH DIFFERENTIAL/PLATELET
Basophils Absolute: 0.1 10*3/uL (ref 0.0–0.2)
Basos: 1 %
EOS (ABSOLUTE): 0.7 10*3/uL — ABNORMAL HIGH (ref 0.0–0.4)
Eos: 7 %
Hematocrit: 38.9 % (ref 37.5–51.0)
Hemoglobin: 12.9 g/dL — ABNORMAL LOW (ref 13.0–17.7)
Immature Grans (Abs): 0 10*3/uL (ref 0.0–0.1)
Immature Granulocytes: 0 %
Lymphocytes Absolute: 3.1 10*3/uL (ref 0.7–3.1)
Lymphs: 34 %
MCH: 28.5 pg (ref 26.6–33.0)
MCHC: 33.2 g/dL (ref 31.5–35.7)
MCV: 86 fL (ref 79–97)
Monocytes Absolute: 0.7 10*3/uL (ref 0.1–0.9)
Monocytes: 7 %
Neutrophils Absolute: 4.6 10*3/uL (ref 1.4–7.0)
Neutrophils: 51 %
Platelets: 280 10*3/uL (ref 150–450)
RBC: 4.52 x10E6/uL (ref 4.14–5.80)
RDW: 12.6 % (ref 11.6–15.4)
WBC: 9 10*3/uL (ref 3.4–10.8)

## 2022-08-08 LAB — HEMOGLOBIN A1C
Est. average glucose Bld gHb Est-mCnc: 120 mg/dL
Hgb A1c MFr Bld: 5.8 % — ABNORMAL HIGH (ref 4.8–5.6)

## 2022-08-19 ENCOUNTER — Other Ambulatory Visit: Payer: Self-pay | Admitting: Orthopedic Surgery

## 2022-08-24 DIAGNOSIS — R262 Difficulty in walking, not elsewhere classified: Secondary | ICD-10-CM | POA: Diagnosis not present

## 2022-08-24 DIAGNOSIS — R269 Unspecified abnormalities of gait and mobility: Secondary | ICD-10-CM | POA: Diagnosis not present

## 2022-08-24 DIAGNOSIS — S88112S Complete traumatic amputation at level between knee and ankle, left lower leg, sequela: Secondary | ICD-10-CM | POA: Diagnosis not present

## 2022-08-24 DIAGNOSIS — M25651 Stiffness of right hip, not elsewhere classified: Secondary | ICD-10-CM | POA: Diagnosis not present

## 2022-08-24 DIAGNOSIS — M1611 Unilateral primary osteoarthritis, right hip: Secondary | ICD-10-CM | POA: Diagnosis not present

## 2022-08-31 ENCOUNTER — Encounter (HOSPITAL_COMMUNITY): Payer: Self-pay

## 2022-08-31 NOTE — Patient Instructions (Signed)
SURGICAL WAITING ROOM VISITATION  Patients having surgery or a procedure may have no more than 2 support people in the waiting area - these visitors may rotate.    Children under the age of 59 must have an adult with them who is not the patient.  Due to an increase in RSV and influenza rates and associated hospitalizations, children ages 35 and under may not visit patients in [place].  If the patient needs to stay at the hospital during part of their recovery, the visitor guidelines for inpatient rooms apply. Pre-op nurse will coordinate an appropriate time for 1 support person to accompany patient in pre-op.  This support person may not rotate.    Please refer to the [facility] website for the visitor guidelines for Inpatients (after your surgery is over and you are in a regular room).       Your procedure is scheduled on: 09-14-22   Report to [facility] Main Entrance    Report to admitting at       1015   AM   Call this number if you have problems the morning of surgery [phone]   Do not eat food :After Midnight.   After Midnight you may have the following liquids until _0945_____ AM  DAY OF SURGERY  Water Non-Citrus Juices (without pulp, NO RED-Apple, White grape, White cranberry) Black Coffee (NO MILK/CREAM OR CREAMERS, sugar ok)  Clear Tea (NO MILK/CREAM OR CREAMERS, sugar ok) regular and decaf                             Plain Jell-O (NO RED)                                           Fruit ices (not with fruit pulp, NO RED)                                     Popsicles (NO RED)                                                               Sports drinks like Gatorade (NO RED)                   The day of surgery:  Drink ONE (1) Pre-Surgery G2 at   0930  AM the morning of surgery. Drink in one sitting. Do not sip.  This drink was given to you during your hospital  pre-op appointment visit. Nothing else to drink after completing the   Pre-Surgery Clear G2.by 0945 then nothing by mouth          If you have questions, please contact your surgeon's office.   FOLLOW  ANY ADDITIONAL PRE OP INSTRUCTIONS YOU RECEIVED FROM YOUR SURGEON'S OFFICE!!!     Oral Hygiene is also important to reduce your risk of infection.  Remember - BRUSH YOUR TEETH THE MORNING OF SURGERY WITH YOUR REGULAR TOOTHPASTE  DENTURES WILL BE REMOVED PRIOR TO SURGERY PLEASE DO NOT APPLY "Poly grip" OR ADHESIVES!!!   Do NOT smoke after Midnight   Take these medicines the morning of surgery with A SIP OF WATER: amlodipine  DO NOT TAKE ANY ORAL DIABETIC MEDICATIONS DAY OF YOUR SURGERY  Bring CPAP mask and tubing day of surgery.                              You may not have any metal on your body including hair pins, jewelry, and body piercing             Do not wear  lotions, powders, perfumes/cologne, or deodorant                Men may shave face and neck.   Do not bring valuables to the hospital. [place].   Contacts, glasses, dentures or bridgework may not be worn into surgery.   Bring small overnight bag day of surgery.   DO NOT [place]. PHARMACY WILL DISPENSE MEDICATIONS LISTED ON YOUR MEDICATION LIST TO YOU DURING YOUR ADMISSION [place]!    Patients discharged on the day of surgery will not be allowed to drive home.  Someone NEEDS to stay with you for the first 24 hours after anesthesia.               Please read over the following fact sheets you were given: IF [place] [phone]   If you received a COVID test during your pre-op visit  it is requested that you wear a mask when out in public, stay away from anyone that may not be feeling well and notify your surgeon if you develop symptoms. If you test positive for Covid or have been in contact with  anyone that has tested positive in the last 10 days please notify you surgeon.    [place] - Preparing for Surgery Before surgery, you can play an important role.  Because skin is not sterile, your skin needs to be as free of germs as possible.  You can reduce the number of germs on your skin by washing with CHG (chlorahexidine gluconate) soap before surgery.  CHG is an antiseptic cleaner which kills germs and bonds with the skin to continue killing germs even after washing. Please DO NOT use if you have an allergy to CHG or antibacterial soaps.  If your skin becomes reddened/irritated stop using the CHG and inform your nurse when you arrive at Short Stay. Do not shave (including legs and underarms) for at least 48 hours prior to the first CHG shower.  You may shave your face/neck. Please follow these instructions carefully:  1.  Shower with CHG Soap the night before surgery and the  morning of Surgery.  2.  If you choose to wash your hair, wash your hair first as usual with your  normal  shampoo.  3.  After you shampoo, rinse your hair and body thoroughly to remove the  shampoo.                           4.  Use CHG as  you would any other liquid soap.  You can apply chg directly  to the skin and wash                       Gently with a scrungie or clean washcloth.  5.  Apply the CHG Soap to your body ONLY FROM THE NECK DOWN.   Do not use on face/ open                           Wound or open sores. Avoid contact with eyes, ears mouth and genitals (private parts).                       Wash face,  Genitals (private parts) with your normal soap.             6.  Wash thoroughly, paying special attention to the area where your surgery  will be performed.  7.  Thoroughly rinse your body with warm water from the neck down.  8.  DO NOT shower/wash with your normal soap after using and rinsing off  the CHG Soap.                9.  Pat yourself dry with a clean towel.            10.  Wear clean  pajamas.            11.  Place clean sheets on your bed the night of your first shower and do not  sleep with pets. Day of Surgery : Do not apply any lotions/deodorants the morning of surgery.  Please wear clean clothes to the hospital/surgery center.  FAILURE TO FOLLOW THESE INSTRUCTIONS MAY RESULT IN THE CANCELLATION OF YOUR SURGERY PATIENT SIGNATURE_________________________________  NURSE SIGNATURE__________________________________  ________________________________________________________________________  Carl Vang  An incentive spirometer is a tool that can help keep your lungs clear and active. This tool measures how well you are filling your lungs with each breath. Taking long deep breaths may help reverse or decrease the chance of developing breathing (pulmonary) problems (especially infection) following: A long period of time when you are unable to move or be active. BEFORE THE PROCEDURE  If the spirometer includes an indicator to show your best effort, your nurse or respiratory therapist will set it to a desired goal. If possible, sit up straight or lean slightly forward. Try not to slouch. Hold the incentive spirometer in an upright position. INSTRUCTIONS FOR USE  Sit on the edge of your bed if possible, or sit up as far as you can in bed or on a chair. Hold the incentive spirometer in an upright position. Breathe out normally. Place the mouthpiece in your mouth and seal your lips tightly around it. Breathe in slowly and as deeply as possible, raising the piston or the ball toward the top of the column. Hold your breath for 3-5 seconds or for as long as possible. Allow the piston or ball to fall to the bottom of the column. Remove the mouthpiece from your mouth and breathe out normally. Rest for a few seconds and repeat Steps 1 through 7 at least 10 times every 1-2 hours when you are awake. Take your time and take a few normal breaths between deep breaths. The  spirometer may include an indicator to show your best effort. Use the indicator as a goal to work toward during each repetition. After each set  of 10 deep breaths, practice coughing to be sure your lungs are clear. If you have an incision (the cut made at the time of surgery), support your incision when coughing by placing a pillow or rolled up towels firmly against it. Once you are able to get out of bed, walk around indoors and cough well. You may stop using the incentive spirometer when instructed by your caregiver.  RISKS AND COMPLICATIONS Take your time so you do not get dizzy or light-headed. If you are in pain, you may need to take or ask for pain medication before doing incentive spirometry. It is harder to take a deep breath if you are having pain. AFTER USE Rest and breathe slowly and easily. It can be helpful to keep track of a log of your progress. Your caregiver can provide you with a simple table to help with this. If you are using the spirometer at home, follow these instructions: [place] IF:  You are having difficultly using the spirometer. You have trouble using the spirometer as often as instructed. Your pain medication is not giving enough relief while using the spirometer. You develop fever of 100.5 F (38.1 C) or higher. SEEK IMMEDIATE MEDICAL CARE IF:  You cough up bloody sputum that had not been present before. You develop fever of 102 F (38.9 C) or greater. You develop worsening pain at or near the incision site. MAKE SURE YOU:  Understand these instructions. Will watch your condition. Will get help right away if you are not doing well or get worse. Document Released: 11/02/2006 Document Revised: 09/14/2011 Document Reviewed: 01/03/2007 ExitCare Patient Information 2014 ExitCare, [place].   ________________________________________________________________________ WHAT IS A BLOOD TRANSFUSION? Blood Transfusion Information  A transfusion is the  replacement of blood or some of its parts. Blood is made up of multiple cells which provide different functions. Red blood cells carry oxygen and are used for blood loss replacement. White blood cells fight against infection. Platelets control bleeding. Plasma helps clot blood. Other blood products are available for specialized needs, such as hemophilia or other clotting disorders. BEFORE THE TRANSFUSION  Who gives blood for transfusions?  Healthy volunteers who are fully evaluated to make sure their blood is safe. This is blood bank blood. Transfusion therapy is the safest it has ever been in the practice of medicine. Before blood is taken from a donor, a complete history is taken to make sure that person has no history of diseases nor engages in risky social behavior (examples are intravenous drug use or sexual activity with multiple partners). The donor's travel history is screened to minimize risk of transmitting infections, such as malaria. The donated blood is tested for signs of infectious diseases, such as HIV and hepatitis. The blood is then tested to be sure it is compatible with you in order to minimize the chance of a transfusion reaction. If you or a relative donates blood, this is often done in anticipation of surgery and is not appropriate for emergency situations. It takes many days to process the donated blood. RISKS AND COMPLICATIONS Although transfusion therapy is very safe and saves many lives, the main dangers of transfusion include:  Getting an infectious disease. Developing a transfusion reaction. This is an allergic reaction to something in the blood you were given. Every precaution is taken to prevent this. The decision to have a blood transfusion has been considered carefully by your caregiver before blood is given. Blood is not given unless the benefits outweigh the risks. AFTER  THE TRANSFUSION Right after receiving a blood transfusion, you will usually feel much better and  more energetic. This is especially true if your red blood cells have gotten low (anemic). The transfusion raises the level of the red blood cells which carry oxygen, and this usually causes an energy increase. The nurse administering the transfusion will monitor you carefully for complications. HOME CARE INSTRUCTIONS  No special instructions are needed after a transfusion. You may find your energy is better. Speak with your caregiver about any limitations on activity for underlying diseases you may have. SEEK MEDICAL CARE IF:  Your condition is not improving after your transfusion. You develop redness or irritation at the intravenous (IV) site. SEEK IMMEDIATE MEDICAL CARE IF:  Any of the following symptoms occur over the next 12 hours: Shaking chills. You have a temperature by mouth above 102 F (38.9 C), not controlled by medicine. Chest, back, or muscle pain. People around you feel you are not acting correctly or are confused. Shortness of breath or difficulty breathing. Dizziness and fainting. You get a rash or develop hives. You have a decrease in urine output. Your urine turns a dark color or changes to pink, red, or brown. Any of the following symptoms occur over the next 10 days: You have a temperature by mouth above 102 F (38.9 C), not controlled by medicine. Shortness of breath. Weakness after normal activity. The white part of the eye turns yellow (jaundice). You have a decrease in the amount of urine or are urinating less often. Your urine turns a dark color or changes to pink, red, or brown. Document Released: 06/19/2000 Document Revised: 09/14/2011 Document Reviewed: 02/06/2008 [facility] Patient Information 2014 [place], [place].  _______________________________________________________________________

## 2022-08-31 NOTE — Progress Notes (Addendum)
PCP - LOV Carl Fleming,NP 08-07-22 preop eval epic Cardiologist - no  PPM/ICD -  Device Orders -  Rep Notified -   Chest x-ray -  EKG - 08-07-22 epic Stress Test -  ECHO -  Cardiac Cath -  HgbA1c-08-07-22 epic 5.8  Sleep Study -  CPAP -   Fasting Blood Sugar -  Checks Blood Sugar __0___ times a day  Blood Thinner Instructions: Aspirin Instructions:  ERAS Protcol - PRE-SURGERY G2-    COVID vaccine -no  Activity--Able to complete ADL's with a walker without SOB or CP Anesthesia review: L BKA with prosthesis, HTN, PreDM  Patient denies shortness of breath, fever, cough and chest pain at PAT appointment   All instructions explained to the patient, with a verbal understanding of the material. Patient agrees to go over the instructions while at home for a better understanding. Patient also instructed to self quarantine after being tested for COVID-19. The opportunity to ask questions was provided.

## 2022-09-01 ENCOUNTER — Encounter (HOSPITAL_COMMUNITY): Payer: Self-pay

## 2022-09-01 ENCOUNTER — Ambulatory Visit (HOSPITAL_COMMUNITY)
Admission: RE | Admit: 2022-09-01 | Discharge: 2022-09-01 | Disposition: A | Discharge status: Home / Self Care | Payer: 59 | Source: Ambulatory Visit | Attending: Orthopedic Surgery | Admitting: Orthopedic Surgery

## 2022-09-01 ENCOUNTER — Encounter (HOSPITAL_COMMUNITY)
Admission: RE | Admit: 2022-09-01 | Discharge: 2022-09-01 | Disposition: A | Discharge status: Home / Self Care | Payer: 59 | Source: Ambulatory Visit | Attending: Orthopedic Surgery | Admitting: Orthopedic Surgery

## 2022-09-01 ENCOUNTER — Other Ambulatory Visit: Payer: Self-pay

## 2022-09-01 VITALS — BP 132/81 | HR 69 | Temp 97.7°F | Resp 16 | Ht 65.0 in | Wt 175.0 lb

## 2022-09-01 DIAGNOSIS — Z01818 Encounter for other preprocedural examination: Secondary | ICD-10-CM | POA: Insufficient documentation

## 2022-09-01 DIAGNOSIS — I10 Essential (primary) hypertension: Secondary | ICD-10-CM | POA: Diagnosis not present

## 2022-09-01 DIAGNOSIS — J9811 Atelectasis: Secondary | ICD-10-CM | POA: Diagnosis not present

## 2022-09-01 DIAGNOSIS — R7301 Impaired fasting glucose: Secondary | ICD-10-CM | POA: Diagnosis not present

## 2022-09-01 HISTORY — DX: Prediabetes: R73.03

## 2022-09-01 LAB — BASIC METABOLIC PANEL
Anion gap: 6 (ref 5–15)
BUN: 13 mg/dL (ref 8–23)
CO2: 27 mmol/L (ref 22–32)
Calcium: 8.9 mg/dL (ref 8.9–10.3)
Chloride: 103 mmol/L (ref 98–111)
Creatinine, Ser: 0.92 mg/dL (ref 0.61–1.24)
GFR, Estimated: >60 mL/min (ref >60)
Glucose, Bld: 101 mg/dL — ABNORMAL HIGH (ref 70–99)
Potassium: 3.8 mmol/L (ref 3.5–5.1)
Sodium: 136 mmol/L (ref 135–145)

## 2022-09-01 LAB — CBC
HCT: 40.9 % (ref 39.0–52.0)
Hemoglobin: 13.4 g/dL (ref 13.0–17.0)
MCH: 28.6 pg (ref 26.0–34.0)
MCHC: 32.8 g/dL (ref 30.0–36.0)
MCV: 87.4 fL (ref 80.0–100.0)
Platelets: 291 10*3/uL (ref 150–400)
RBC: 4.68 MIL/uL (ref 4.22–5.81)
RDW: 12.6 % (ref 11.5–15.5)
WBC: 9.8 10*3/uL (ref 4.0–10.5)
nRBC: 0 % (ref 0.0–0.2)

## 2022-09-01 LAB — SURGICAL PCR SCREEN
MRSA, PCR: NEGATIVE
Staphylococcus aureus: NEGATIVE

## 2022-09-01 LAB — GLUCOSE, CAPILLARY: Glucose-Capillary: 118 mg/dL — ABNORMAL HIGH (ref 70–99)

## 2022-09-01 LAB — TYPE AND SCREEN
ABO/RH(D): A POS
Antibody Screen: NEGATIVE

## 2022-09-07 ENCOUNTER — Telehealth: Payer: Self-pay | Admitting: Nurse Practitioner

## 2022-09-07 DIAGNOSIS — S02602B Fracture of unspecified part of body of left mandible, initial encounter for open fracture: Secondary | ICD-10-CM | POA: Insufficient documentation

## 2022-09-07 DIAGNOSIS — S02602D Fracture of unspecified part of body of left mandible, subsequent encounter for fracture with routine healing: Secondary | ICD-10-CM | POA: Diagnosis not present

## 2022-09-07 NOTE — Telephone Encounter (Signed)
Contacted Carl Vang to schedule their annual wellness visit. Appointment made for 09/11/22.  Carl Vang AWV direct phone # [phone]

## 2022-09-08 NOTE — Care Plan (Signed)
Ortho Bundle Case Management Note  Patient Details  Name: Carl Vang MRN: [id number] Date of Birth: 1960/11/22    spoke with patient. states that he lives with his sister but she works and no one can be with him. He and I have spoken twice about his discharge plan. has equipment at home. discharge instructions discussed and mailed to patient. MD updated. He is set up with [facility] for OPPT. He states he will find someone to stay with him.                 DME Arranged:    DME Agency:     HH Arranged:  PT HH Agency:  [place]  Additional Comments: Please contact me with any questions of if this plan should need to change.  Ladell Heads,  [facility]  [phone] 09/08/2022, 3:23 PM
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
## 2022-09-11 ENCOUNTER — Ambulatory Visit: Payer: 59 | Attending: Nurse Practitioner

## 2022-09-11 VITALS — Ht 65.0 in | Wt 172.0 lb

## 2022-09-11 DIAGNOSIS — Z0000 Encounter for general adult medical examination without abnormal findings: Secondary | ICD-10-CM

## 2022-09-11 NOTE — Patient Instructions (Signed)
[name] , Thank you for taking time to come for your Medicare Wellness Visit. I appreciate your ongoing commitment to your health goals. Please review the following plan we discussed and let me know if I can assist you in the future.   These are the goals we discussed:  Goals      Patient Stated     09/11/2022, wants to walk better        This is a list of the screening recommended for you and due dates:  Health Maintenance  Topic Date Due   COVID-19 Vaccine (1) Never done   Zoster (Shingles) Vaccine (1 of 2) Never done   Medicare Annual Wellness Visit  09/11/2023   Colon Cancer Screening  08/06/2024   DTaP/Tdap/Td vaccine (2 - Td or Tdap) 06/04/2026   Flu Shot  Completed   Hepatitis C Screening: USPSTF Recommendation to screen - Ages 18-79 yo.  Completed   HIV Screening  Completed   HPV Vaccine  Aged Out    Advanced directives: Advance directive discussed with you today.   Conditions/risks identified: none  Next appointment: Follow up in one year for your annual wellness visit   Preventive Care 40-64 Years, Male Preventive care refers to lifestyle choices and visits with your health care provider that can promote health and wellness. What does preventive care include? A yearly physical exam. This is also called an annual well check. Dental exams once or twice a year. Routine eye exams. Ask your health care provider how often you should have your eyes checked. Personal lifestyle choices, including: Daily care of your teeth and gums. Regular physical activity. Eating a healthy diet. Avoiding tobacco and drug use. Limiting alcohol use. Practicing safe sex. Taking low-dose aspirin every day starting at age 23. What happens during an annual well check? The services and screenings done by your health care provider during your annual well check will depend on your age, overall health, lifestyle risk factors, and family history of disease. Counseling  Your health care  provider may ask you questions about your: Alcohol use. Tobacco use. Drug use. Emotional well-being. Home and relationship well-being. Sexual activity. Eating habits. Work and work Statistician. Screening  You may have the following tests or measurements: Height, weight, and BMI. Blood pressure. Lipid and cholesterol levels. These may be checked every 5 years, or more frequently if you are over 59 years old. Skin check. Lung cancer screening. You may have this screening every year starting at age 17 if you have a 30-pack-year history of smoking and currently smoke or have quit within the past 15 years. Fecal occult blood test (FOBT) of the stool. You may have this test every year starting at age 87. Flexible sigmoidoscopy or colonoscopy. You may have a sigmoidoscopy every 5 years or a colonoscopy every 10 years starting at age 20. Prostate cancer screening. Recommendations will vary depending on your family history and other risks. Hepatitis C blood test. Hepatitis B blood test. Sexually transmitted disease (STD) testing. Diabetes screening. This is done by checking your blood sugar (glucose) after you have not eaten for a while (fasting). You may have this done every 1-3 years. Discuss your test results, treatment options, and if necessary, the need for more tests with your health care provider. Vaccines  Your health care provider may recommend certain vaccines, such as: Influenza vaccine. This is recommended every year. Tetanus, diphtheria, and acellular pertussis (Tdap, Td) vaccine. You may need a Td booster every 10 years. Zoster vaccine.  You may need this after age 62. Pneumococcal 13-valent conjugate (PCV13) vaccine. You may need this if you have certain conditions and have not been vaccinated. Pneumococcal polysaccharide (PPSV23) vaccine. You may need one or two doses if you smoke cigarettes or if you have certain conditions. Talk to your health care provider about which  screenings and vaccines you need and how often you need them. This information is not intended to replace advice given to you by your health care provider. Make sure you discuss any questions you have with your health care provider. Document Released: 07/19/2015 Document Revised: 03/11/2016 Document Reviewed: 04/23/2015 Elsevier Interactive Patient Education  2017 [place] Prevention in the Home Falls can cause injuries. They can happen to people of all ages. There are many things you can do to make your home safe and to help prevent falls. What can I do on the outside of my home? Regularly fix the edges of walkways and driveways and fix any cracks. Remove anything that might make you trip as you walk through a door, such as a raised step or threshold. Trim any bushes or trees on the path to your home. Use bright outdoor lighting. Clear any walking paths of anything that might make someone trip, such as rocks or tools. Regularly check to see if handrails are loose or broken. Make sure that both sides of any steps have handrails. Any raised decks and porches should have guardrails on the edges. Have any leaves, snow, or ice cleared regularly. Use sand or salt on walking paths during winter. Clean up any spills in your garage right away. This includes oil or grease spills. What can I do in the bathroom? Use night lights. Install grab bars by the toilet and in the tub and shower. Do not use towel bars as grab bars. Use non-skid mats or decals in the tub or shower. If you need to sit down in the shower, use a plastic, non-slip stool. Keep the floor dry. Clean up any water that spills on the floor as soon as it happens. Remove soap buildup in the tub or shower regularly. Attach bath mats securely with double-sided non-slip rug tape. Do not have throw rugs and other things on the floor that can make you trip. What can I do in the bedroom? Use night lights. Make sure that you have a  light by your bed that is easy to reach. Do not use any sheets or blankets that are too big for your bed. They should not hang down onto the floor. Have a firm chair that has side arms. You can use this for support while you get dressed. Do not have throw rugs and other things on the floor that can make you trip. What can I do in the kitchen? Clean up any spills right away. Avoid walking on wet floors. Keep items that you use a lot in easy-to-reach places. If you need to reach something above you, use a strong step stool that has a grab bar. Keep electrical cords out of the way. Do not use floor polish or wax that makes floors slippery. If you must use wax, use non-skid floor wax. Do not have throw rugs and other things on the floor that can make you trip. What can I do with my stairs? Do not leave any items on the stairs. Make sure that there are handrails on both sides of the stairs and use them. Fix handrails that are broken or loose. Make sure  that handrails are as long as the stairways. Check any carpeting to make sure that it is firmly attached to the stairs. Fix any carpet that is loose or worn. Avoid having throw rugs at the top or bottom of the stairs. If you do have throw rugs, attach them to the floor with carpet tape. Make sure that you have a light switch at the top of the stairs and the bottom of the stairs. If you do not have them, ask someone to add them for you. What else can I do to help prevent falls? Wear shoes that: Do not have high heels. Have rubber bottoms. Are comfortable and fit you well. Are closed at the toe. Do not wear sandals. If you use a stepladder: Make sure that it is fully opened. Do not climb a closed stepladder. Make sure that both sides of the stepladder are locked into place. Ask someone to hold it for you, if possible. Clearly mark and make sure that you can see: Any grab bars or handrails. First and last steps. Where the edge of each step  is. Use tools that help you move around (mobility aids) if they are needed. These include: Canes. Walkers. Scooters. Crutches. Turn on the lights when you go into a dark area. Replace any light bulbs as soon as they burn out. Set up your furniture so you have a clear path. Avoid moving your furniture around. If any of your floors are uneven, fix them. If there are any pets around you, be aware of where they are. Review your medicines with your doctor. Some medicines can make you feel dizzy. This can increase your chance of falling. Ask your doctor what other things that you can do to help prevent falls. This information is not intended to replace advice given to you by your health care provider. Make sure you discuss any questions you have with your health care provider. Document Released: 04/18/2009 Document Revised: 11/28/2015 Document Reviewed: 07/27/2014 Elsevier Interactive Patient Education  2017 [employer].

## 2022-09-11 NOTE — Progress Notes (Signed)
I connected with  Carl Vang on 09/11/22 by a audio enabled telemedicine application and verified that I am speaking with the correct person using two identifiers.  Patient Location: Home  Provider Location: Office/Clinic  I discussed the limitations of evaluation and management by telemedicine. The patient expressed understanding and agreed to proceed.  Subjective:   Carl Vang is a 62 y.o. male who presents for an Initial Medicare Annual Wellness Visit.  Review of Systems     Cardiac Risk Factors include: advanced age (>49mn, >>18women);dyslipidemia;hypertension;male gender     Objective:    Today's Vitals   09/11/22 1433  Weight: 172 lb (78 kg)  Height: '5\' 5"'$  (1.651 m)  PainSc: 6    Body mass index is 28.62 kg/m.     09/11/2022    2:38 PM 09/01/2022    1:20 PM 05/06/2022   12:29 PM 02/26/2019   11:18 PM 05/28/2018   11:28 AM 04/08/2018    2:58 PM 04/04/2018    2:22 PM  Advanced Directives  Does Patient Have a Medical Advance Directive? No No No No No No No  Would patient like information on creating a medical advance directive?  No - Patient declined No - Patient declined No - Patient declined No - Patient declined No - Patient declined No - Patient declined    Current Medications (verified) Outpatient Encounter Medications as of 09/11/2022  Medication Sig   acetaminophen-codeine (TYLENOL #3) 300-30 MG tablet Take 1 tablet by mouth every 12 (twelve) hours as needed for moderate pain.   amLODipine (NORVASC) 10 MG tablet Take 1 tablet (10 mg total) by mouth daily.   atorvastatin (LIPITOR) 20 MG tablet Take 1 tablet (20 mg total) by mouth every evening.   chlorthalidone (HYGROTON) 25 MG tablet Take 1 tablet (25 mg total) by mouth daily.   meloxicam (MOBIC) 15 MG tablet Take 1 tablet (15 mg total) by mouth daily.   No facility-administered encounter medications on file as of 09/11/2022.    Allergies (verified) Patient has no known allergies.   History: Past  Medical History:  Diagnosis Date   Hyperlipidemia    Hypertension    Osteoarthritis of knee    Pre-diabetes    pt denies   Prediabetes    Past Surgical History:  Procedure Laterality Date   COLONOSCOPY WITH PROPOFOL N/A 08/06/2014   Procedure: COLONOSCOPY WITH PROPOFOL;  Surgeon: MGarlan Fair MD;  Location: [facility];  Service: Endoscopy;  Laterality: N/A;   HERNIA REPAIR     left leg below knee amputation   2003   in accident at work    [place]   Procedure: RIGHT TOTAL KNEE ARTHROPLASTY;  Surgeon: GDorna Leitz MD;  Location: [facility];  Service: Orthopedics;  Laterality: Right;   Family History  Problem Relation Age of Onset   Diabetes Mother    Hypertension Mother    Cancer Mother    Stroke Mother    Diabetes Father    Hypertension Father    Diabetes Daughter    Hypertension Daughter    Heart disease Daughter    Social History   Socioeconomic History   Marital status: Single    Spouse name: Not on file   Number of children: Not on file   Years of education: Not on file   Highest education level: Not on file  Occupational History  Not on file  Tobacco Use   Smoking status: Never   Smokeless tobacco: Never  Vaping Use   Vaping Use: Never used  Substance and Sexual Activity   Alcohol use: Not Currently    Comment: special occasions 1-2 drinks only   Drug use: No   Sexual activity: Not Currently  Other Topics Concern   Not on file  Social History Narrative   Not on file   Social Determinants of Health   Financial Resource Strain: Low Risk  (09/11/2022)   Overall Financial Resource Strain (CARDIA)    Difficulty of Paying Living Expenses: Not hard at all  Food Insecurity: No Food Insecurity (09/11/2022)   Hunger Vital Sign    Worried About Running Out of Food in the Last Year: Never true    Ran Out of Food in the Last Year: Never true  Transportation  Needs: No Transportation Needs (09/11/2022)   PRAPARE - Hydrologist (Medical): No    Lack of Transportation (Non-Medical): No  Physical Activity: Inactive (09/11/2022)   Exercise Vital Sign    Days of Exercise per Week: 0 days    Minutes of Exercise per Session: 0 min  Stress: No Stress Concern Present (09/11/2022)   [place]    Feeling of Stress : Not at all  Social Connections: Not on file    Tobacco Counseling Counseling given: Not Answered   Clinical Intake:  Pre-visit preparation completed: Yes  Pain : 0-10 Pain Score: 6  Pain Type: Chronic pain Pain Location: Knee Pain Orientation: Right Pain Descriptors / Indicators: Aching Pain Onset: More than a month ago Pain Frequency: Constant     Nutritional Status: BMI 25 -29 Overweight Nutritional Risks: None Diabetes: No  How often do you need to have someone help you when you read instructions, pamphlets, or other written materials from your doctor or pharmacy?: 1 - Never  Diabetic? no  Interpreter Needed?: No  Information entered by :: NAllen LPN   Activities of Daily Living    09/11/2022    2:39 PM 09/01/2022    1:24 PM  In your present state of health, do you have any difficulty performing the following activities:  Hearing? 0   Vision? 0   Difficulty concentrating or making decisions? 0   Walking or climbing stairs? 1   Dressing or bathing? 0   Doing errands, shopping? 0 0  Preparing Food and eating ? N   Using the Toilet? N   In the past six months, have you accidently leaked urine? N   Do you have problems with loss of bowel control? N   Managing your Medications? N   Managing your Finances? N   Housekeeping or managing your Housekeeping? N     Patient Care Team: Gildardo Pounds, NP as PCP - General (Nurse Practitioner)  Indicate any recent Medical Services you may have received from other than [facility]  providers in the past year (date may be approximate).     Assessment:   This is a routine wellness examination for Carl Vang.  Hearing/Vision screen Vision Screening - Comments:: No Regular eye exams,   Dietary issues and exercise activities discussed: Current Exercise Habits: The patient does not participate in regular exercise at present   Goals Addressed             This Visit's Progress    Patient Stated       09/11/2022, wants to  walk better       Depression Screen    09/11/2022    2:39 PM 08/07/2022    2:11 PM 07/15/2022    3:48 PM 03/23/2022    9:42 AM 02/27/2019   10:42 AM 01/28/2017    2:23 PM 08/25/2016    1:50 PM  PHQ 2/9 Scores  PHQ - 2 Score 0 0 0 0 0 0 0  PHQ- 9 Score  0 0 0 0 0 0    Fall Risk    09/11/2022    2:39 PM 08/07/2022    2:07 PM 07/15/2022    3:47 PM 03/23/2022    9:39 AM 04/07/2019    2:06 PM  Fall Risk   Falls in the past year? 1 0 0 0 0  Comment knee gave out      Number falls in past yr: 0 0 0 0   Injury with Fall? 0 0 0 0   Risk for fall due to : Impaired mobility;Impaired balance/gait;Medication side effect No Fall Risks     Follow up Falls prevention discussed;Education provided;Falls evaluation completed Falls evaluation completed       FALL RISK PREVENTION PERTAINING TO THE HOME:  Any stairs in or around the home? No  If so, are there any without handrails? N/a Home free of loose throw rugs in walkways, pet beds, electrical cords, etc? Yes  Adequate lighting in your home to reduce risk of falls? Yes   ASSISTIVE DEVICES UTILIZED TO PREVENT FALLS:  Life alert? No  Use of a cane, walker or w/c? Yes  Grab bars in the bathroom? No  Shower chair or bench in shower? Yes  Elevated toilet seat or a handicapped toilet? Yes   TIMED UP AND GO:  Was the test performed? No .       Cognitive Function:        09/11/2022    2:40 PM  6CIT Screen  What Year? 4 points  What month? 0 points  What time? 0 points  Count back from 20 0 points   Months in reverse 4 points  Repeat phrase 4 points  Total Score 12 points    Immunizations Immunization History  Administered Date(s) Administered   Influenza,inj,Quad PF,6+ Mos 06/16/2013, 04/09/2014, 04/07/2016, 02/27/2019, 03/23/2022   Tdap 06/04/2016    TDAP status: Up to date  Flu Vaccine status: Up to date  Pneumococcal vaccine status: Up to date  Covid-19 vaccine status: Completed vaccines  Qualifies for Shingles Vaccine? Yes   Zostavax completed No   Shingrix Completed?: No.    Education has been provided regarding the importance of this vaccine. Patient has been advised to call insurance company to determine out of pocket expense if they have not yet received this vaccine. Advised may also receive vaccine at local pharmacy or Health Dept. Verbalized acceptance and understanding.  Screening Tests Health Maintenance  Topic Date Due   Medicare Annual Wellness (AWV)  Never done   COVID-19 Vaccine (1) Never done   Zoster Vaccines- Shingrix (1 of 2) Never done   COLONOSCOPY (Pts 45-30yr Insurance coverage will need to be confirmed)  08/06/2024   DTaP/Tdap/Td (2 - Td or Tdap) 06/04/2026   INFLUENZA VACCINE  Completed   Hepatitis C Screening  Completed   HIV Screening  Completed   HPV VACCINES  Aged Out    Health Maintenance  Health Maintenance Due  Topic Date Due   Medicare Annual Wellness (AWV)  Never done   COVID-19 Vaccine (1)  Never done   Zoster Vaccines- Shingrix (1 of 2) Never done    Colorectal cancer screening: Type of screening: Colonoscopy. Completed 08/06/2014. Repeat every 10 years  Lung Cancer Screening: (Low Dose CT Chest recommended if Age 32-80 years, 30 pack-year currently smoking OR have quit w/in 15years.) does not qualify.   Lung Cancer Screening Referral: no  Additional Screening:  Hepatitis C Screening: does qualify; Completed 04/07/2016  Vision Screening: Recommended annual ophthalmology exams for early detection of glaucoma and other  disorders of the eye. Is the patient up to date with their annual eye exam?  No  Who is the provider or what is the name of the office in which the patient attends annual eye exams? none If pt is not established with a provider, would they like to be referred to a provider to establish care? No .   Dental Screening: Recommended annual dental exams for proper oral hygiene  Community Resource Referral / Chronic Care Management: CRR required this visit?  No   CCM required this visit?  No      Plan:     I have personally reviewed and noted the following in the patient's chart:   Medical and social history Use of alcohol, tobacco or illicit drugs  Current medications and supplements including opioid prescriptions. Patient is not currently taking opioid prescriptions. Functional ability and status Nutritional status Physical activity Advanced directives List of other physicians Hospitalizations, surgeries, and ER visits in previous 12 months Vitals Screenings to include cognitive, depression, and falls Referrals and appointments  In addition, I have reviewed and discussed with patient certain preventive protocols, quality metrics, and best practice recommendations. A written personalized care plan for preventive services as well as general preventive health recommendations were provided to patient.     Kellie Simmering, LPN   [id number]   Nurse Notes: none  Due to this being a virtual visit, the after visit summary with patients personalized plan was offered to patient via mail or my-chart.  Patient would like to access on my-chart

## 2022-09-14 ENCOUNTER — Encounter (HOSPITAL_COMMUNITY): Payer: Self-pay | Admitting: Orthopedic Surgery

## 2022-09-14 ENCOUNTER — Other Ambulatory Visit: Payer: Self-pay

## 2022-09-14 ENCOUNTER — Inpatient Hospital Stay (HOSPITAL_COMMUNITY): Payer: 59 | Admitting: Certified Registered"

## 2022-09-14 ENCOUNTER — Encounter (HOSPITAL_COMMUNITY)
Admission: RE | Disposition: A | Discharge status: Home Health | Payer: Self-pay | Source: Ambulatory Visit | Attending: Orthopedic Surgery

## 2022-09-14 ENCOUNTER — Inpatient Hospital Stay (HOSPITAL_COMMUNITY)
Admission: RE | Admit: 2022-09-14 | Discharge: 2022-09-17 | DRG: 470 | Disposition: A | Discharge status: Home Health | Payer: 59 | Source: Ambulatory Visit | Attending: Orthopedic Surgery | Admitting: Orthopedic Surgery

## 2022-09-14 ENCOUNTER — Inpatient Hospital Stay (HOSPITAL_COMMUNITY): Payer: 59

## 2022-09-14 DIAGNOSIS — Z833 Family history of diabetes mellitus: Secondary | ICD-10-CM

## 2022-09-14 DIAGNOSIS — Z79899 Other long term (current) drug therapy: Secondary | ICD-10-CM

## 2022-09-14 DIAGNOSIS — Z823 Family history of stroke: Secondary | ICD-10-CM

## 2022-09-14 DIAGNOSIS — E782 Mixed hyperlipidemia: Secondary | ICD-10-CM | POA: Diagnosis present

## 2022-09-14 DIAGNOSIS — I10 Essential (primary) hypertension: Secondary | ICD-10-CM | POA: Diagnosis present

## 2022-09-14 DIAGNOSIS — Z89512 Acquired absence of left leg below knee: Secondary | ICD-10-CM

## 2022-09-14 DIAGNOSIS — M1611 Unilateral primary osteoarthritis, right hip: Secondary | ICD-10-CM | POA: Diagnosis not present

## 2022-09-14 DIAGNOSIS — Z8249 Family history of ischemic heart disease and other diseases of the circulatory system: Secondary | ICD-10-CM | POA: Diagnosis not present

## 2022-09-14 DIAGNOSIS — Z96651 Presence of right artificial knee joint: Secondary | ICD-10-CM | POA: Diagnosis not present

## 2022-09-14 DIAGNOSIS — E669 Obesity, unspecified: Secondary | ICD-10-CM | POA: Diagnosis present

## 2022-09-14 DIAGNOSIS — R7303 Prediabetes: Secondary | ICD-10-CM | POA: Diagnosis present

## 2022-09-14 DIAGNOSIS — Z96641 Presence of right artificial hip joint: Principal | ICD-10-CM

## 2022-09-14 DIAGNOSIS — Z471 Aftercare following joint replacement surgery: Secondary | ICD-10-CM | POA: Diagnosis not present

## 2022-09-14 DIAGNOSIS — Z6828 Body mass index (BMI) 28.0-28.9, adult: Secondary | ICD-10-CM

## 2022-09-14 DIAGNOSIS — Z791 Long term (current) use of non-steroidal anti-inflammatories (NSAID): Secondary | ICD-10-CM

## 2022-09-14 DIAGNOSIS — R7301 Impaired fasting glucose: Secondary | ICD-10-CM

## 2022-09-14 DIAGNOSIS — Z809 Family history of malignant neoplasm, unspecified: Secondary | ICD-10-CM | POA: Diagnosis not present

## 2022-09-14 HISTORY — PX: TOTAL HIP ARTHROPLASTY: SHX124

## 2022-09-14 SURGERY — ARTHROPLASTY, HIP, TOTAL, ANTERIOR APPROACH
Anesthesia: Monitor Anesthesia Care | Site: Hip | Laterality: Right

## 2022-09-14 MED ORDER — FENTANYL CITRATE (PF) 100 MCG/2ML IJ SOLN
INTRAMUSCULAR | Status: AC
  Filled 2022-09-14: qty 2

## 2022-09-14 MED ORDER — OXYCODONE HCL 5 MG PO TABS
5.0000 mg | ORAL_TABLET | ORAL | Status: DC | PRN
  Administered 2022-09-15: 10 mg via ORAL
  Administered 2022-09-15 – 2022-09-16 (×2): 5 mg via ORAL
  Administered 2022-09-16 – 2022-09-17 (×2): 10 mg via ORAL
  Filled 2022-09-14 (×6): qty 2
  Filled 2022-09-14: qty 1

## 2022-09-14 MED ORDER — ONDANSETRON HCL 4 MG/2ML IJ SOLN: 4.0000 mg | Freq: Four times a day (QID) | INTRAMUSCULAR | Status: DC | PRN

## 2022-09-14 MED ORDER — PROPOFOL 500 MG/50ML IV EMUL
INTRAVENOUS | Status: DC | PRN
  Administered 2022-09-14: 75 ug/kg/min via INTRAVENOUS

## 2022-09-14 MED ORDER — FENTANYL CITRATE (PF) 100 MCG/2ML IJ SOLN
INTRAMUSCULAR | Status: DC | PRN
  Administered 2022-09-14: 25 ug via INTRAVENOUS

## 2022-09-14 MED ORDER — DIPHENHYDRAMINE HCL 12.5 MG/5ML PO ELIX: 12.5000 mg | ORAL_SOLUTION | ORAL | Status: DC | PRN

## 2022-09-14 MED ORDER — LACTATED RINGERS IV SOLN: INTRAVENOUS | Status: DC

## 2022-09-14 MED ORDER — DEXAMETHASONE SODIUM PHOSPHATE 10 MG/ML IJ SOLN
10.0000 mg | Freq: Two times a day (BID) | INTRAMUSCULAR | Status: AC
  Administered 2022-09-15 – 2022-09-16 (×3): 10 mg via INTRAVENOUS
  Filled 2022-09-14 (×3): qty 1

## 2022-09-14 MED ORDER — ACETAMINOPHEN 325 MG PO TABS
325.0000 mg | ORAL_TABLET | Freq: Four times a day (QID) | ORAL | Status: DC | PRN
  Filled 2022-09-14: qty 2

## 2022-09-14 MED ORDER — BUPIVACAINE LIPOSOME 1.3 % IJ SUSP
INTRAMUSCULAR | Status: DC | PRN
  Administered 2022-09-14: 10 mL

## 2022-09-14 MED ORDER — PANTOPRAZOLE SODIUM 40 MG PO TBEC
40.0000 mg | DELAYED_RELEASE_TABLET | Freq: Every day | ORAL | Status: DC
  Administered 2022-09-14 – 2022-09-17 (×4): 40 mg via ORAL
  Filled 2022-09-14 (×4): qty 1

## 2022-09-14 MED ORDER — DOCUSATE SODIUM 100 MG PO CAPS
100.0000 mg | ORAL_CAPSULE | Freq: Two times a day (BID) | ORAL | Status: DC
  Administered 2022-09-14 – 2022-09-17 (×6): 100 mg via ORAL
  Filled 2022-09-14 (×6): qty 1

## 2022-09-14 MED ORDER — PHENYLEPHRINE HCL-NACL 20-0.9 MG/250ML-% IV SOLN
INTRAVENOUS | Status: DC | PRN
  Administered 2022-09-14: 20 ug/min via INTRAVENOUS

## 2022-09-14 MED ORDER — METHOCARBAMOL 500 MG PO TABS
500.0000 mg | ORAL_TABLET | Freq: Four times a day (QID) | ORAL | Status: DC | PRN
  Administered 2022-09-15: 500 mg via ORAL
  Filled 2022-09-14: qty 1

## 2022-09-14 MED ORDER — CHLORHEXIDINE GLUCONATE 0.12 % MT SOLN
15.0000 mL | Freq: Once | OROMUCOSAL | Status: AC
  Administered 2022-09-14: 15 mL via OROMUCOSAL

## 2022-09-14 MED ORDER — ACETAMINOPHEN 500 MG PO TABS
1000.0000 mg | ORAL_TABLET | Freq: Four times a day (QID) | ORAL | Status: AC
  Administered 2022-09-14 – 2022-09-15 (×4): 1000 mg via ORAL
  Filled 2022-09-14 (×4): qty 2

## 2022-09-14 MED ORDER — TRANEXAMIC ACID-NACL 1000-0.7 MG/100ML-% IV SOLN
1000.0000 mg | INTRAVENOUS | Status: AC
  Administered 2022-09-14: 1000 mg via INTRAVENOUS
  Filled 2022-09-14: qty 100

## 2022-09-14 MED ORDER — MIDAZOLAM HCL 2 MG/2ML IJ SOLN
INTRAMUSCULAR | Status: AC
  Filled 2022-09-14: qty 2

## 2022-09-14 MED ORDER — WATER FOR IRRIGATION, STERILE IR SOLN
Status: DC | PRN
  Administered 2022-09-14: 2000 mL

## 2022-09-14 MED ORDER — LIDOCAINE 2% (20 MG/ML) 5 ML SYRINGE
INTRAMUSCULAR | Status: DC | PRN
  Administered 2022-09-14: 40 mg via INTRAVENOUS

## 2022-09-14 MED ORDER — PHENOL 1.4 % MT LIQD: 1.0000 | OROMUCOSAL | Status: DC | PRN

## 2022-09-14 MED ORDER — BUPIVACAINE LIPOSOME 1.3 % IJ SUSP
INTRAMUSCULAR | Status: AC
  Filled 2022-09-14: qty 10

## 2022-09-14 MED ORDER — POLYETHYLENE GLYCOL 3350 17 G PO PACK: 17.0000 g | PACK | Freq: Every day | ORAL | Status: DC | PRN

## 2022-09-14 MED ORDER — TEMAZEPAM 15 MG PO CAPS
15.0000 mg | ORAL_CAPSULE | Freq: Every evening | ORAL | Status: DC | PRN
  Administered 2022-09-15: 15 mg via ORAL
  Filled 2022-09-14: qty 1

## 2022-09-14 MED ORDER — DEXAMETHASONE SODIUM PHOSPHATE 10 MG/ML IJ SOLN
INTRAMUSCULAR | Status: AC
  Filled 2022-09-14: qty 1

## 2022-09-14 MED ORDER — ONDANSETRON HCL 4 MG/2ML IJ SOLN
INTRAMUSCULAR | Status: DC | PRN
  Administered 2022-09-14: 4 mg via INTRAVENOUS

## 2022-09-14 MED ORDER — 0.9 % SODIUM CHLORIDE (POUR BTL) OPTIME
TOPICAL | Status: DC | PRN
  Administered 2022-09-14: 1000 mL

## 2022-09-14 MED ORDER — ASPIRIN 325 MG PO TBEC
325.0000 mg | DELAYED_RELEASE_TABLET | Freq: Two times a day (BID) | ORAL | Status: DC
  Administered 2022-09-15 – 2022-09-17 (×5): 325 mg via ORAL
  Filled 2022-09-14 (×5): qty 1

## 2022-09-14 MED ORDER — POVIDONE-IODINE 10 % EX SWAB
2.0000 | Freq: Once | CUTANEOUS | Status: AC
  Administered 2022-09-14: 2 via TOPICAL

## 2022-09-14 MED ORDER — ORAL CARE MOUTH RINSE: 15.0000 mL | Freq: Once | OROMUCOSAL | Status: AC

## 2022-09-14 MED ORDER — MIDAZOLAM HCL 2 MG/2ML IJ SOLN
INTRAMUSCULAR | Status: DC | PRN
  Administered 2022-09-14 (×2): 1 mg via INTRAVENOUS

## 2022-09-14 MED ORDER — MAGNESIUM CITRATE PO SOLN: 1.0000 | Freq: Once | ORAL | Status: DC | PRN

## 2022-09-14 MED ORDER — CEFAZOLIN SODIUM-DEXTROSE 2-4 GM/100ML-% IV SOLN
2.0000 g | Freq: Four times a day (QID) | INTRAVENOUS | Status: AC
  Administered 2022-09-14 – 2022-09-15 (×2): 2 g via INTRAVENOUS
  Filled 2022-09-14 (×2): qty 100

## 2022-09-14 MED ORDER — PROPOFOL 10 MG/ML IV BOLUS
INTRAVENOUS | Status: AC
  Filled 2022-09-14: qty 20

## 2022-09-14 MED ORDER — ACETAMINOPHEN 10 MG/ML IV SOLN: 1000.0000 mg | Freq: Once | INTRAVENOUS | Status: DC | PRN

## 2022-09-14 MED ORDER — FENTANYL CITRATE PF 50 MCG/ML IJ SOSY: 25.0000 ug | PREFILLED_SYRINGE | INTRAMUSCULAR | Status: DC | PRN

## 2022-09-14 MED ORDER — OXYCODONE HCL 5 MG PO TABS
10.0000 mg | ORAL_TABLET | ORAL | Status: DC | PRN
  Administered 2022-09-15 (×2): 10 mg via ORAL

## 2022-09-14 MED ORDER — PROPOFOL 10 MG/ML IV BOLUS
INTRAVENOUS | Status: DC | PRN
  Administered 2022-09-14: 20 mg via INTRAVENOUS

## 2022-09-14 MED ORDER — TRANEXAMIC ACID-NACL 1000-0.7 MG/100ML-% IV SOLN
1000.0000 mg | Freq: Once | INTRAVENOUS | Status: AC
  Administered 2022-09-14: 1000 mg via INTRAVENOUS
  Filled 2022-09-14: qty 100

## 2022-09-14 MED ORDER — BISACODYL 5 MG PO TBEC: 5.0000 mg | DELAYED_RELEASE_TABLET | Freq: Every day | ORAL | Status: DC | PRN

## 2022-09-14 MED ORDER — MENTHOL 3 MG MT LOZG: 1.0000 | LOZENGE | OROMUCOSAL | Status: DC | PRN

## 2022-09-14 MED ORDER — METOCLOPRAMIDE HCL 5 MG PO TABS: 5.0000 mg | ORAL_TABLET | Freq: Three times a day (TID) | ORAL | Status: DC | PRN

## 2022-09-14 MED ORDER — CEFAZOLIN SODIUM-DEXTROSE 2-4 GM/100ML-% IV SOLN
2.0000 g | INTRAVENOUS | Status: AC
  Administered 2022-09-14: 2 g via INTRAVENOUS
  Filled 2022-09-14: qty 100

## 2022-09-14 MED ORDER — DEXAMETHASONE SODIUM PHOSPHATE 10 MG/ML IJ SOLN
INTRAMUSCULAR | Status: DC | PRN
  Administered 2022-09-14: 5 mg via INTRAVENOUS

## 2022-09-14 MED ORDER — ONDANSETRON HCL 4 MG PO TABS: 4.0000 mg | ORAL_TABLET | Freq: Four times a day (QID) | ORAL | Status: DC | PRN

## 2022-09-14 MED ORDER — ALUM & MAG HYDROXIDE-SIMETH 200-200-20 MG/5ML PO SUSP
30.0000 mL | ORAL | Status: DC | PRN
  Administered 2022-09-15: 30 mL via ORAL
  Filled 2022-09-14: qty 30

## 2022-09-14 MED ORDER — BUPIVACAINE IN DEXTROSE 0.75-8.25 % IT SOLN
INTRATHECAL | Status: DC | PRN
  Administered 2022-09-14: 1.8 mL via INTRATHECAL

## 2022-09-14 MED ORDER — BUPIVACAINE LIPOSOME 1.3 % IJ SUSP: 10.0000 mL | Freq: Once | INTRAMUSCULAR | Status: DC

## 2022-09-14 MED ORDER — ONDANSETRON HCL 4 MG/2ML IJ SOLN
INTRAMUSCULAR | Status: AC
  Filled 2022-09-14: qty 2

## 2022-09-14 MED ORDER — METOCLOPRAMIDE HCL 5 MG/ML IJ SOLN: 5.0000 mg | Freq: Three times a day (TID) | INTRAMUSCULAR | Status: DC | PRN

## 2022-09-14 MED ORDER — METHOCARBAMOL 500 MG IVPB - SIMPLE MED: 500.0000 mg | Freq: Four times a day (QID) | INTRAVENOUS | Status: DC | PRN

## 2022-09-14 MED ORDER — BUPIVACAINE-EPINEPHRINE (PF) 0.25% -1:200000 IJ SOLN
INTRAMUSCULAR | Status: AC
  Filled 2022-09-14: qty 30

## 2022-09-14 MED ORDER — HYDROMORPHONE HCL 1 MG/ML IJ SOLN
0.5000 mg | INTRAMUSCULAR | Status: DC | PRN
  Administered 2022-09-14: 1 mg via INTRAVENOUS
  Filled 2022-09-14: qty 1

## 2022-09-14 MED ORDER — SODIUM CHLORIDE 0.9 % IV SOLN: INTRAVENOUS | Status: DC

## 2022-09-14 MED ORDER — BUPIVACAINE-EPINEPHRINE 0.25% -1:200000 IJ SOLN
INTRAMUSCULAR | Status: DC | PRN
  Administered 2022-09-14: 30 mL

## 2022-09-14 SURGICAL SUPPLY — 47 items
APL SKNCLS STERI-STRIP NONHPOA (GAUZE/BANDAGES/DRESSINGS) ×1
BAG COUNTER SPONGE SURGICOUNT (BAG) IMPLANT
BAG SPEC THK2 15X12 ZIP CLS (MISCELLANEOUS)
BAG SPNG CNTER NS LX DISP (BAG)
BAG ZIPLOCK 12X15 (MISCELLANEOUS) IMPLANT
BENZOIN TINCTURE PRP APPL 2/3 (GAUZE/BANDAGES/DRESSINGS) IMPLANT
BLADE SAW SGTL 18X1.27X75 (BLADE) ×1 IMPLANT
BLADE SURG SZ10 CARB STEEL (BLADE) ×2 IMPLANT
COVER PERINEAL POST (MISCELLANEOUS) ×1 IMPLANT
COVER SURGICAL LIGHT HANDLE (MISCELLANEOUS) ×1 IMPLANT
CUP ACETABULAR GRIPTON 100 52 (Orthopedic Implant) IMPLANT
DRAPE FOOT SWITCH (DRAPES) ×1 IMPLANT
DRAPE STERI IOBAN 125X83 (DRAPES) ×1 IMPLANT
DRAPE U-SHAPE 47X51 STRL (DRAPES) ×2 IMPLANT
DRSG AQUACEL AG ADV 3.5X 6 (GAUZE/BANDAGES/DRESSINGS) ×1 IMPLANT
DURAPREP 26ML APPLICATOR (WOUND CARE) ×1 IMPLANT
ELECT REM PT RETURN 15FT ADLT (MISCELLANEOUS) ×1 IMPLANT
ELIMINATOR HOLE APEX DEPUY (Hips) IMPLANT
GAUZE XEROFORM 1X8 LF (GAUZE/BANDAGES/DRESSINGS) IMPLANT
GLOVE BIOGEL PI IND STRL 8 (GLOVE) ×2 IMPLANT
GLOVE ECLIPSE 7.5 STRL STRAW (GLOVE) ×2 IMPLANT
GOWN STRL REUS W/ TWL XL LVL3 (GOWN DISPOSABLE) ×2 IMPLANT
GOWN STRL REUS W/TWL XL LVL3 (GOWN DISPOSABLE) ×2
GRIPTON 100 52 (Orthopedic Implant) ×1 IMPLANT
HEAD CERAMIC 36 PLUS5 (Hips) IMPLANT
HOLDER FOLEY CATH W/STRAP (MISCELLANEOUS) ×1 IMPLANT
HOOD PEEL AWAY T7 (MISCELLANEOUS) ×3 IMPLANT
KIT TURNOVER KIT A (KITS) IMPLANT
LINER ACETAB NEUTRAL 36ID 520D (Liner) IMPLANT
NDL HYPO 22X1.5 SAFETY MO (MISCELLANEOUS) ×1 IMPLANT
NEEDLE HYPO 22X1.5 SAFETY MO (MISCELLANEOUS) ×1 IMPLANT
NEEDLE SAFETY HYPO 22GAX1.5 (MISCELLANEOUS) ×1
PACK ANTERIOR HIP CUSTOM (KITS) ×1 IMPLANT
SPIKE FLUID TRANSFER (MISCELLANEOUS) ×1 IMPLANT
STAPLER VISISTAT 35W (STAPLE) IMPLANT
STEM FEM ACTIS STD SZ2 (Stem) IMPLANT
STRIP CLOSURE SKIN 1/2X4 (GAUZE/BANDAGES/DRESSINGS) IMPLANT
SUT ETHIBOND NAB CT1 #1 30IN (SUTURE) ×2 IMPLANT
SUT MNCRL AB 3-0 PS2 18 (SUTURE) IMPLANT
SUT VIC AB 0 CT1 36 (SUTURE) ×1 IMPLANT
SUT VIC AB 1 CT1 36 (SUTURE) ×1 IMPLANT
SUT VIC AB 2-0 CT1 27 (SUTURE) ×1
SUT VIC AB 2-0 CT1 TAPERPNT 27 (SUTURE) ×1 IMPLANT
SUT VIC AB 3-0 CT1 27 (SUTURE)
SUT VIC AB 3-0 CT1 TAPERPNT 27 (SUTURE) IMPLANT
TRAY FOLEY MTR SLVR 16FR STAT (SET/KITS/TRAYS/PACK) ×1 IMPLANT
TUBE SUCTION HIGH CAP CLEAR NV (SUCTIONS) ×1 IMPLANT

## 2022-09-14 NOTE — Anesthesia Postprocedure Evaluation (Signed)
Anesthesia Post Note  Patient: Carl Vang  Procedure(s) Performed: TOTAL HIP ARTHROPLASTY ANTERIOR APPROACH (Right: Hip)     Patient location during evaluation: PACU Anesthesia Type: Spinal Level of consciousness: awake Pain management: pain level controlled Vital Signs Assessment: post-procedure vital signs reviewed and stable Respiratory status: spontaneous breathing, nonlabored ventilation and respiratory function stable Cardiovascular status: blood pressure returned to baseline and stable Postop Assessment: no apparent nausea or vomiting Anesthetic complications: no   No notable events documented.  Last Vitals:  Vitals:   09/14/22 1750 09/14/22 2017  BP: 128/80 120/63  Pulse: 69 86  Resp: 16 17  Temp: 36.5 C 36.4 C  SpO2: 95% 97%    Last Pain:  Vitals:   09/14/22 1750  TempSrc: Oral  PainSc: 0-No pain                 Skyla Champagne P Sayid Moll

## 2022-09-14 NOTE — Anesthesia Procedure Notes (Signed)
Procedure Name: MAC Date/Time: 09/14/2022 1:02 PM  Performed by: Eben Burow, CRNAPre-anesthesia Checklist: Patient identified, Emergency Drugs available, Suction available, Patient being monitored and Timeout performed Oxygen Delivery Method: Simple face mask Placement Confirmation: positive ETCO2

## 2022-09-14 NOTE — Op Note (Signed)
PATIENT ID:      STEVENMICHAEL DEWOODY  MRN:     [id number] DOB/AGE:    1961/05/17 / 62 y.o.       OPERATIVE REPORT    DATE OF PROCEDURE:  09/14/2022       PREOPERATIVE DIAGNOSIS:  RIGHT HIP DEGENERATIVE JOINT DISEASE                                                       Estimated body mass index is 28.62 kg/m as calculated from the following:   Height as of this encounter: '5\' 5"'$  (1.651 m).   Weight as of this encounter: 78 kg.     POSTOPERATIVE DIAGNOSIS:  RIGHT HIP DEGENERATIVE JOINT DISEASE                                                           PROCEDURE:  1. right total hip arthroplasty using a 52 mm DePuy Pinnacle gription Cup, [employer],  neutral liner, a +5 36 mm ceramic head,  and a ##2  Corail stem, 2.interpretation of multiple intraoperative fluoroscopic images   SURGEON: Alta Corning    ASSISTANT:   Filbert Berthold PA-C  (present throughout entire procedure and necessary for timely completion of the procedure)  ANESTHESIA: spinal  BLOOD LOSS: 350 cc Tranexamic Acid: 1 gram IV DRAINS: None COMPLICATIONS: None    NDICATIONS FOR PROCEDURE:Patient with end-stage arthritis of the right hip.  X-rays show bone-on-bone arthritic changes. Despite conservative measures with observation, anti-inflammatory medicine, narcotics, use of a cane, has severe unremitting pain and can ambulate only less than 1 block before resting.  Patient desires elective right total hip arthroplasty to decrease pain and increase function. The risks, benefits, and alternatives were discussed at length including but not limited to the risks of infection, bleeding, nerve injury, stiffness, blood clots, the need for revision surgery, cardiopulmonary complications, among others, and they were willing to proceed.Benefits have been discussed. Questions answered.the patient has a left below-knee amputation and is well aware that his recovery can be complex because of this pre-existing condition     PROCEDURE IN  DETAIL: The patient was identified by armband,  received preoperative IV antibiotics in the holding area at [facility], taken to the operating room , appropriate anesthetic monitors  were attached and spinal anesthesia was induced.  The patient was placed onto the hot bed and all bony prominences were well-padded.The right hip was prepped and draped for an anterior approach to the hip.  An incision was made and the subcutaneous dissection was down to the level of the tensor fascia.  The fascia was opened and finger dissected.  The bleeders coming across the anterior portion of the hip were identified and cauterized. Retractors were put in place above and below the femoral neck.  The capsule was opened and tagged and a provisional neck cut was made.  The head was removed and sized on the back table.  The acetabulum was sequentially reamed to a level of 51 mm and a 52 mm porous-coated pinnacle gription cup was hammered into place with 45 of lateral opening  and 30 of anteversion.fluoroscopy was used to ensure this position of the cup.  Attention was turned towards the femur where the leg was actually rotated, extended, and adduction did.  The femur was sequentially broached until a size of 2 Actis broach gave a perfect fit and fill.at this point a  1.5 mm delta ceramic hip ball was placed and the hip reduced.  Fluoroscopic images were taken to assess the leg length, fit and fill of the stem, and cup position.  We were happy with the construct at this point.  The 2 broach was removed and a final Actis stem with standard offset  and a +5 mm ceramic hip ball was placed and reduced.  Final images were taken to make certain there were happy with the position at this point.   The capsule was closed with #1 Vicryl suture.  The tensor fascia was closed with 0 Vicryl suture.  The skin was then closed with combination of 0 and 2-0 Vicryl suture.  The top layer was with 3-0 Monocryl suture.  Benzoin and  Steri-Strips were applied  and a sterile compressive dressing was applied and the patient taken to recovery room she noted be in satisfactory condition.  Past medical Motion for the procedure was approximately 300 cc.  Of note lMike Cecilie Lowers, PAc was present the entire case and assisted by retraction of tissues, manipulation of the leg, and closing the minimize or time.    Alta Corning 09/14/2022, 5:51 PM
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
## 2022-09-14 NOTE — Anesthesia Procedure Notes (Signed)
Spinal  Patient location during procedure: OR Start time: 09/14/2022 1:10 PM Reason for block: surgical anesthesia Staffing Performed: resident/CRNA  Anesthesiologist: Murvin Natal, MD Resident/CRNA: Eben Burow, CRNA Performed by: Eben Burow, CRNA Authorized by: Murvin Natal, MD   Preanesthetic Checklist Completed: patient identified, IV checked, site marked, risks and benefits discussed, surgical consent, monitors and equipment checked, pre-op evaluation and timeout performed Spinal Block Patient position: sitting Prep: DuraPrep and site prepped and draped Patient monitoring: continuous pulse ox, blood pressure, cardiac monitor and heart rate Approach: midline Location: L3-4 Injection technique: single-shot Needle Needle type: Pencan  Needle gauge: 24 G Needle length: 10 cm Assessment Events: CSF return Additional Notes Pt placed in sitting position, spinal kit expiration date checked and verified, + CSF, - heme, pt tolerated well. Dr Roanna Banning present and supervising throughout SAB placement. Adequate sensory level.

## 2022-09-14 NOTE — Transfer of Care (Signed)
Immediate Anesthesia Transfer of Care Note  Patient: Carl Vang  Procedure(s) Performed: TOTAL HIP ARTHROPLASTY ANTERIOR APPROACH (Right: Hip)  Patient Location: PACU  Anesthesia Type:Spinal  Level of Consciousness: awake  Airway & Oxygen Therapy: Patient Spontanous Breathing and Patient connected to face mask oxygen  Post-op Assessment: Report given to RN and Post -op Vital signs reviewed and stable  Post vital signs: Reviewed and stable  Last Vitals:  Vitals Value Taken Time  BP 81/47 09/14/22 1537  Temp    Pulse 69 09/14/22 1539  Resp 11 09/14/22 1539  SpO2 95 % 09/14/22 1539  Vitals shown include unvalidated device data.  Last Pain:  Vitals:   09/14/22 1033  TempSrc: Oral         Complications: No notable events documented.

## 2022-09-14 NOTE — Anesthesia Preprocedure Evaluation (Addendum)
Anesthesia Evaluation  Patient identified by MRN, date of birth, ID band Patient awake    Reviewed: Allergy & Precautions, NPO status , Patient's Chart, lab work & pertinent test results  Airway Mallampati: II  TM Distance: >3 FB Neck ROM: Full    Dental no notable dental hx.    Pulmonary neg pulmonary ROS   Pulmonary exam normal        Cardiovascular hypertension, Pt. on medications  Rhythm:Regular Rate:Normal     Neuro/Psych negative neurological ROS  negative psych ROS   GI/Hepatic negative GI ROS, Neg liver ROS,,,  Endo/Other  negative endocrine ROS    Renal/GU negative Renal ROS  negative genitourinary   Musculoskeletal  (+) Arthritis , Osteoarthritis,    Abdominal Normal abdominal exam  (+)   Peds  Hematology negative hematology ROS (+)   Anesthesia Other Findings   Reproductive/Obstetrics                             Anesthesia Physical Anesthesia Plan  ASA: 2  Anesthesia Plan: MAC and Spinal   Post-op Pain Management:    Induction: Intravenous  PONV Risk Score and Plan: 1 and Ondansetron, Dexamethasone, Propofol infusion and Treatment may vary due to age or medical condition  Airway Management Planned: Simple Face Mask and Nasal Cannula  Additional Equipment: None  Intra-op Plan:   Post-operative Plan:   Informed Consent: I have reviewed the patients History and Physical, chart, labs and discussed the procedure including the risks, benefits and alternatives for the proposed anesthesia with the patient or authorized representative who has indicated his/her understanding and acceptance.     Dental advisory given  Plan Discussed with: CRNA  Anesthesia Plan Comments: (Lab Results      Component                Value               Date                      WBC                      9.8                 09/01/2022                HGB                      13.4                 09/01/2022                HCT                      40.9                09/01/2022                MCV                      87.4                09/01/2022                PLT  291                 09/01/2022           )       Anesthesia Quick Evaluation

## 2022-09-15 DIAGNOSIS — Z89512 Acquired absence of left leg below knee: Secondary | ICD-10-CM | POA: Diagnosis not present

## 2022-09-15 DIAGNOSIS — E669 Obesity, unspecified: Secondary | ICD-10-CM | POA: Diagnosis present

## 2022-09-15 DIAGNOSIS — Z96641 Presence of right artificial hip joint: Secondary | ICD-10-CM | POA: Diagnosis not present

## 2022-09-15 DIAGNOSIS — Z8249 Family history of ischemic heart disease and other diseases of the circulatory system: Secondary | ICD-10-CM | POA: Diagnosis not present

## 2022-09-15 DIAGNOSIS — Z809 Family history of malignant neoplasm, unspecified: Secondary | ICD-10-CM | POA: Diagnosis not present

## 2022-09-15 DIAGNOSIS — M1611 Unilateral primary osteoarthritis, right hip: Secondary | ICD-10-CM | POA: Diagnosis not present

## 2022-09-15 DIAGNOSIS — Z6828 Body mass index (BMI) 28.0-28.9, adult: Secondary | ICD-10-CM | POA: Diagnosis not present

## 2022-09-15 DIAGNOSIS — Z96651 Presence of right artificial knee joint: Secondary | ICD-10-CM | POA: Diagnosis present

## 2022-09-15 DIAGNOSIS — Z791 Long term (current) use of non-steroidal anti-inflammatories (NSAID): Secondary | ICD-10-CM | POA: Diagnosis not present

## 2022-09-15 DIAGNOSIS — R7303 Prediabetes: Secondary | ICD-10-CM | POA: Diagnosis present

## 2022-09-15 DIAGNOSIS — Z79899 Other long term (current) drug therapy: Secondary | ICD-10-CM | POA: Diagnosis not present

## 2022-09-15 DIAGNOSIS — E782 Mixed hyperlipidemia: Secondary | ICD-10-CM | POA: Diagnosis present

## 2022-09-15 DIAGNOSIS — Z833 Family history of diabetes mellitus: Secondary | ICD-10-CM | POA: Diagnosis not present

## 2022-09-15 DIAGNOSIS — I10 Essential (primary) hypertension: Secondary | ICD-10-CM | POA: Diagnosis present

## 2022-09-15 DIAGNOSIS — Z823 Family history of stroke: Secondary | ICD-10-CM | POA: Diagnosis not present

## 2022-09-15 LAB — CBC
HCT: 35 % — ABNORMAL LOW (ref 39.0–52.0)
Hemoglobin: 11.6 g/dL — ABNORMAL LOW (ref 13.0–17.0)
MCH: 28.4 pg (ref 26.0–34.0)
MCHC: 33.1 g/dL (ref 30.0–36.0)
MCV: 85.8 fL (ref 80.0–100.0)
Platelets: 217 10*3/uL (ref 150–400)
RBC: 4.08 MIL/uL — ABNORMAL LOW (ref 4.22–5.81)
RDW: 12.5 % (ref 11.5–15.5)
WBC: 14 10*3/uL — ABNORMAL HIGH (ref 4.0–10.5)
nRBC: 0 % (ref 0.0–0.2)

## 2022-09-15 NOTE — Evaluation (Signed)
Physical Therapy Evaluation Patient Details Name: Carl Vang MRN: [id number] DOB: 1960-10-08 Today's Date: 09/15/2022  History of Present Illness  62 yo male s/p R THA DA 09/14/22. Hx of L BKA, R TKA 2019  Clinical Impression  On eval, pt required Min A for mobility. He walked ~135 feet with a RW. Pt reported pain is controlled. He tolerated activity well. Will continue to follow and progress activity as tolerated. Pt reports he is planning to remain in hospital for a few days.        Recommendations for follow up therapy are one component of a multi-disciplinary discharge planning process, led by the attending physician.  Recommendations may be updated based on patient status, additional functional criteria and insurance authorization.  Follow Up Recommendations Follow physician's recommendations for discharge plan and follow up therapies      Assistance Recommended at Discharge Intermittent Supervision/Assistance  Patient can return home with the following  A little help with walking and/or transfers;A little help with bathing/dressing/bathroom;Assistance with cooking/housework;Assist for transportation;Help with stairs or ramp for entrance    Equipment Recommendations Rolling walker (2 wheels)  Recommendations for Other Services       Functional Status Assessment Patient has had a recent decline in their functional status and demonstrates the ability to make significant improvements in function in a reasonable and predictable amount of time.     Precautions / Restrictions Precautions Precautions: Fall Restrictions Weight Bearing Restrictions: No Other Position/Activity Restrictions: wbat      Mobility  Bed Mobility Overal bed mobility: Needs Assistance Bed Mobility: Supine to Sit     Supine to sit: Min assist     General bed mobility comments: Assist for R LE. Increased time. Cues provided.    Transfers Overall transfer level: Needs assistance Equipment used:  Rolling walker (2 wheels) Transfers: Sit to/from Stand Sit to Stand: Min assist           General transfer comment: Assist to rise, steady, control descent. Had difficulty getting prosthesis on over sleeve and sock. After several tries, removed sock-able to don prosthesis with audible clicks indicating engagement.    Ambulation/Gait Ambulation/Gait assistance: Min assist Gait Distance (Feet): 125 Feet Assistive device: Rolling walker (2 wheels) Gait Pattern/deviations: Step-to pattern, Trunk flexed       General Gait Details: Intermittent A. Cues for posture, RW proxmity, and for pt to try to get R heel down on floor instead of staying on forefoot. Tolerated distance well. Denied dizziness.  Stairs            Wheelchair Mobility    Modified Rankin (Stroke Patients Only)       Balance Overall balance assessment: Needs assistance         Standing balance support: During functional activity, Reliant on assistive device for balance Standing balance-Leahy Scale: Fair                               Pertinent Vitals/Pain Pain Assessment Pain Assessment: Faces Faces Pain Scale: Hurts little more Pain Location: R hip,thigh Pain Descriptors / Indicators: Discomfort, Sore Pain Intervention(s): Monitored during session, Repositioned    Home Living                          Prior Function                       Hand Dominance  Extremity/Trunk Assessment   Upper Extremity Assessment Upper Extremity Assessment: Overall WFL for tasks assessed    Lower Extremity Assessment Lower Extremity Assessment: LLE deficits/detail LLE Deficits / Details: L BKA    Cervical / Trunk Assessment Cervical / Trunk Assessment: Normal  Communication      Cognition Arousal/Alertness: Awake/alert Behavior During Therapy: WFL for tasks assessed/performed Overall Cognitive Status: Within Functional Limits for tasks assessed                                           General Comments      Exercises     Assessment/Plan    PT Assessment Patient needs continued PT services  PT Problem List Decreased strength;Decreased range of motion;Decreased balance;Decreased mobility;Pain;Decreased knowledge of use of DME;Decreased activity tolerance       PT Treatment Interventions Gait training;Functional mobility training;Therapeutic activities;DME instruction;Balance training;Patient/family education;Therapeutic exercise;Stair training    PT Goals (Current goals can be found in the Care Plan section)  Acute Rehab PT Goals Patient Stated Goal: home in a few days PT Goal Formulation: With patient Time For Goal Achievement: 09/29/22 Potential to Achieve Goals: Good    Frequency 7X/week     Co-evaluation               AM-PAC PT "6 Clicks" Mobility  Outcome Measure Help needed turning from your back to your side while in a flat bed without using bedrails?: A Little Help needed moving from lying on your back to sitting on the side of a flat bed without using bedrails?: A Little Help needed moving to and from a bed to a chair (including a wheelchair)?: A Little Help needed standing up from a chair using your arms (e.g., wheelchair or bedside chair)?: A Little Help needed to walk in hospital room?: A Little Help needed climbing 3-5 steps with a railing? : A Lot 6 Click Score: 17    End of Session Equipment Utilized During Treatment: Gait belt (L prosthesis) Activity Tolerance: Patient tolerated treatment well Patient left: in chair;with call bell/phone within reach;with chair alarm set   PT Visit Diagnosis: Other abnormalities of gait and mobility (R26.89);Difficulty in walking, not elsewhere classified (R26.2)    Time: [id number] PT Time Calculation (min) (ACUTE ONLY): 23 min   Charges:   PT Evaluation $PT Eval Low Complexity: 1 Low PT Treatments $Gait Training: 8-22 mins         Doreatha Massed,  PT Acute Rehabilitation  Office: [phone]

## 2022-09-15 NOTE — Plan of Care (Signed)
Plan of care reviewed and discussed. °

## 2022-09-15 NOTE — Progress Notes (Signed)
Physical Therapy Treatment Patient Details Name: Carl Vang MRN: [id number] DOB: January 31, 1961 Today's Date: 09/15/2022   History of Present Illness 62 yo male s/p R THA DA 09/14/22. Hx of L BKA, R TKA 2019    PT Comments    Pt continues to participate well. He tolerated increased distance and ROM exercises well. He reports he is planning to remain in the hospital for a few days.    Recommendations for follow up therapy are one component of a multi-disciplinary discharge planning process, led by the attending physician.  Recommendations may be updated based on patient status, additional functional criteria and insurance authorization.  Follow Up Recommendations  Follow physician's recommendations for discharge plan and follow up therapies     Assistance Recommended at Discharge Intermittent Supervision/Assistance  Patient can return home with the following A little help with walking and/or transfers;A little help with bathing/dressing/bathroom;Assistance with cooking/housework;Assist for transportation;Help with stairs or ramp for entrance   Equipment Recommendations  Rolling walker (2 wheels)    Recommendations for Other Services       Precautions / Restrictions Precautions Precautions: Fall Precaution Comments: L BKA-has prosthesis Restrictions Weight Bearing Restrictions: No Other Position/Activity Restrictions: wbat     Mobility  Bed Mobility Overal bed mobility: Needs Assistance Bed Mobility: Sit to Supine      Sit to supine: Min assist   General bed mobility comments: Assist for R LE. Increased time. Cues provided.    Transfers Overall transfer level: Needs assistance Equipment used: Rolling walker (2 wheels) Transfers: Sit to/from Stand Sit to Stand: Min assist           General transfer comment: Assist to rise, steady, control descent. Cues for safety, hand placement.    Ambulation/Gait Ambulation/Gait assistance: Min guard Gait Distance (Feet):  175 Feet Assistive device: Rolling walker (2 wheels) Gait Pattern/deviations: Step-to pattern, Trunk flexed       General Gait Details: Min guard A for safety. Slow but steady gait.   Stairs             Wheelchair Mobility    Modified Rankin (Stroke Patients Only)       Balance Overall balance assessment: Needs assistance         Standing balance support: During functional activity, Reliant on assistive device for balance Standing balance-Leahy Scale: Fair                              Cognition Arousal/Alertness: Awake/alert Behavior During Therapy: WFL for tasks assessed/performed Overall Cognitive Status: Within Functional Limits for tasks assessed                                          Exercises Total Joint Exercises Ankle Circles/Pumps: AROM, Right, 10 reps Quad Sets: AROM, Right, 10 reps Heel Slides: AAROM, Right, 10 reps (pt used gait belt) Hip ABduction/ADduction: AROM, Right, 10 reps (pt used gait belt)    General Comments        Pertinent Vitals/Pain Pain Assessment Pain Assessment: Faces Faces Pain Scale: Hurts little more Pain Location: R hip,thigh Pain Descriptors / Indicators: Discomfort, Sore Pain Intervention(s): Monitored during session, Repositioned    Home Living                          Prior Function  PT Goals (current goals can now be found in the care plan section) Acute Rehab PT Goals Patient Stated Goal: home in a few days PT Goal Formulation: With patient Time For Goal Achievement: 09/29/22 Potential to Achieve Goals: Good Progress towards PT goals: Progressing toward goals    Frequency    7X/week      PT Plan Current plan remains appropriate    Co-evaluation              AM-PAC PT "6 Clicks" Mobility   Outcome Measure  Help needed turning from your back to your side while in a flat bed without using bedrails?: A Little Help needed moving from  lying on your back to sitting on the side of a flat bed without using bedrails?: A Little Help needed moving to and from a bed to a chair (including a wheelchair)?: A Little Help needed standing up from a chair using your arms (e.g., wheelchair or bedside chair)?: A Little Help needed to walk in hospital room?: A Little Help needed climbing 3-5 steps with a railing? : A Lot 6 Click Score: 17    End of Session Equipment Utilized During Treatment: Gait belt (L prosthesis) Activity Tolerance: Patient tolerated treatment well Patient left: in bed;with call bell/phone within reach;with bed alarm set   PT Visit Diagnosis: Other abnormalities of gait and mobility (R26.89);Difficulty in walking, not elsewhere classified (R26.2)     Time: 1345-1401 PT Time Calculation (min) (ACUTE ONLY): 16 min  Charges:  $Gait Training: 8-22 mins                         Doreatha Massed, PT Acute Rehabilitation  Office: [phone]

## 2022-09-15 NOTE — TOC Transition Note (Signed)
Transition of Care [facility]) - CM/SW Discharge Note  Patient Details  Name: Carl Vang MRN: [id number] Date of Birth: 1960-12-13  Transition of Care [facility]) CM/SW Contact:  Sherie Don, LCSW Phone Number: 09/15/2022, 9:49 AM  Clinical Narrative: Patient is expected to discharge home after working with PT. CSW met with patient to confirm discharge plan and needs. Patient will go home with HHPT, which was prearranged with [facility]. Patient will need a rolling walker. [employer] delivered walker to patient's room. TOC signing off.    Final next level of care: Home w Home Health Services Barriers to Discharge: No Barriers Identified  Patient Goals and CMS Choice CMS Medicare.gov Compare Post Acute Care list provided to:: Patient Choice offered to / list presented to : Patient  Discharge Plan and Services Additional resources added to the After Visit Summary for        DME Arranged: Walker rolling DME Agency: [employer] Representative spoke with at DME Agency: Prearranged in orthopedist's office HH Arranged: PT [place] Agency: Magazine features editor spoke with at [place]: Prearranged in orthopedist's office  Social Determinants of Health (SDOH) Interventions SDOH Screenings   Food Insecurity: No Food Insecurity (09/14/2022)  Housing: Low Risk  (09/14/2022)  Transportation Needs: No Transportation Needs (09/14/2022)  Utilities: Not At Risk (09/14/2022)  Alcohol Screen: Low Risk  (09/11/2022)  Depression (PHQ2-9): Low Risk  (09/11/2022)  Financial Resource Strain: Low Risk  (09/11/2022)  Physical Activity: Inactive (09/11/2022)  Stress: No Stress Concern Present (09/11/2022)  Tobacco Use: Low Risk  (09/14/2022)   Readmission Risk Interventions     No data to display

## 2022-09-15 NOTE — Progress Notes (Signed)
Subjective:  Patient is status post right total hip replacement yesterday.  He is really without complaints today.  He said his pain is well controlled.  Objective: Vital signs in last 24 hours: Temp:  [96.5 F (35.8 C)-97.9 F (36.6 C)] 97.8 F (36.6 C) (03/12 1006) Pulse Rate:  [64-96] 74 (03/12 1006) Resp:  [10-20] 16 (03/12 1006) BP: (89-128)/(47-80) 128/74 (03/12 1006) SpO2:  [92 %-98 %] 96 % (03/12 1006)  Intake/Output from previous day: 03/11 0701 - 03/12 0700 In: 2585.8 [P.O.:280; I.V.:2005.8; IV Piggyback:300] Out: 2125 [Urine:1850; Blood:275] Intake/Output this shift: Total I/O In: 540 [P.O.:540] Out: 325 [Urine:325]  Recent Labs    09/15/22 0321  HGB 11.6*   Recent Labs    09/15/22 0321  WBC 14.0*  RBC 4.08*  HCT 35.0*  PLT 217   No results for input(s): "NA", "K", "CL", "CO2", "BUN", "CREATININE", "GLUCOSE", "CALCIUM" in the last 72 hours. No results for input(s): "LABPT", "INR" in the last 72 hours.  Neurologically intact ABD soft Neurovascular intact Sensation intact distally Intact pulses distally Dorsiflexion/Plantar flexion intact Compartment soft    Assessment/Plan: 62 year old male status post right total hip replacement complicated by the fact that the patient has a left below-knee amputation.  His home support is limited.  We are going to aggressively try to mobilize him today with the use of his opposite leg prosthesis and make some determination about whether he will be able to go home or to skilled nursing.  It would appear that skilled nursing is the more likely option but we will see how he does over today and tomorrow.     Alta Corning 09/15/2022, 12:06 PM

## 2022-09-16 ENCOUNTER — Encounter (HOSPITAL_COMMUNITY): Payer: Self-pay | Admitting: Orthopedic Surgery

## 2022-09-16 LAB — CBC
HCT: 35.9 % — ABNORMAL LOW (ref 39.0–52.0)
Hemoglobin: 12 g/dL — ABNORMAL LOW (ref 13.0–17.0)
MCH: 28.8 pg (ref 26.0–34.0)
MCHC: 33.4 g/dL (ref 30.0–36.0)
MCV: 86.3 fL (ref 80.0–100.0)
Platelets: 227 10*3/uL (ref 150–400)
RBC: 4.16 MIL/uL — ABNORMAL LOW (ref 4.22–5.81)
RDW: 12.6 % (ref 11.5–15.5)
WBC: 18.9 10*3/uL — ABNORMAL HIGH (ref 4.0–10.5)
nRBC: 0 % (ref 0.0–0.2)

## 2022-09-16 MED ORDER — CHLORTHALIDONE 25 MG PO TABS
25.0000 mg | ORAL_TABLET | Freq: Every day | ORAL | Status: DC
  Administered 2022-09-16 – 2022-09-17 (×2): 25 mg via ORAL
  Filled 2022-09-16 (×2): qty 1

## 2022-09-16 MED ORDER — AMLODIPINE BESYLATE 10 MG PO TABS
10.0000 mg | ORAL_TABLET | Freq: Every day | ORAL | Status: DC
  Administered 2022-09-16 – 2022-09-17 (×2): 10 mg via ORAL
  Filled 2022-09-16 (×2): qty 1

## 2022-09-16 NOTE — Plan of Care (Signed)
  Problem: Education: Goal: Knowledge of the prescribed therapeutic regimen will improve Outcome: Progressing Goal: Understanding of discharge needs will improve Outcome: Progressing   Problem: Activity: Goal: Ability to avoid complications of mobility impairment will improve Outcome: Progressing Goal: Ability to tolerate increased activity will improve Outcome: Progressing   Problem: Clinical Measurements: Goal: Postoperative complications will be avoided or minimized Outcome: Progressing   Problem: Pain Management: Goal: Pain level will decrease with appropriate interventions Outcome: Progressing   Problem: Skin Integrity: Goal: Will show signs of wound healing Outcome: Progressing   Problem: Education: Goal: Knowledge of General Education information will improve Description: Including pain rating scale, medication(s)/side effects and non-pharmacologic comfort measures Outcome: Progressing   Problem: Health Behavior/Discharge Planning: Goal: Ability to manage health-related needs will improve Outcome: Progressing   Problem: Clinical Measurements: Goal: Ability to maintain clinical measurements within normal limits will improve Outcome: Progressing Goal: Will remain free from infection Outcome: Progressing Goal: Diagnostic test results will improve Outcome: Progressing Goal: Respiratory complications will improve Outcome: Progressing Goal: Cardiovascular complication will be avoided Outcome: Progressing   Problem: Activity: Goal: Risk for activity intolerance will decrease Outcome: Progressing   Problem: Coping: Goal: Level of anxiety will decrease Outcome: Progressing   Problem: Elimination: Goal: Will not experience complications related to bowel motility Outcome: Progressing Goal: Will not experience complications related to urinary retention Outcome: Progressing   Problem: Pain Managment: Goal: General experience of comfort will improve Outcome:  Progressing   Problem: Safety: Goal: Ability to remain free from injury will improve Outcome: Progressing   Problem: Skin Integrity: Goal: Risk for impaired skin integrity will decrease Outcome: Progressing

## 2022-09-16 NOTE — Progress Notes (Signed)
Physical Therapy Treatment Patient Details Name: Carl Vang MRN: [id number] DOB: [birth year] Today's Date: 09/16/2022   History of Present Illness 62 yo male s/p R THA DA 09/14/22. Hx of L BKA, R TKA 2019    PT Comments    Progressing with mobility. Moderate pain with activity. Will continue to progress activity as tolerated.   **Pt reports his transportation home will not be available until Sunday**    Recommendations for follow up therapy are one component of a multi-disciplinary discharge planning process, led by the attending physician.  Recommendations may be updated based on patient status, additional functional criteria and insurance authorization.  Follow Up Recommendations  Follow physician's recommendations for discharge plan and follow up therapies     Assistance Recommended at Discharge Intermittent Supervision/Assistance  Patient can return home with the following A little help with walking and/or transfers;A little help with bathing/dressing/bathroom;Assistance with cooking/housework;Assist for transportation;Help with stairs or ramp for entrance   Equipment Recommendations  Rolling walker (2 wheels)    Recommendations for Other Services       Precautions / Restrictions Precautions Precautions: Fall Precaution Comments: L BKA-has prosthesis Restrictions Weight Bearing Restrictions: No RLE Weight Bearing: Weight bearing as tolerated     Mobility  Bed Mobility Overal bed mobility: Needs Assistance Bed Mobility: Supine to Sit     Supine to sit: Supervision, HOB elevated     General bed mobility comments: Pt used gait belt as leg lifter. Increased time. Cues provided.    Transfers Overall transfer level: Needs assistance Equipment used: Rolling walker (2 wheels) Transfers: Sit to/from Stand Sit to Stand: Min guard           General transfer comment: Min guard for safety. Cues for safety, hand placement.    Ambulation/Gait Ambulation/Gait  assistance: Min guard Gait Distance (Feet): 200 Feet Assistive device: Rolling walker (2 wheels) Gait Pattern/deviations: Step-through pattern, Decreased stride length       General Gait Details: Min guard A for safety. Slow but steady gait.   Stairs             Wheelchair Mobility    Modified Rankin (Stroke Patients Only)       Balance Overall balance assessment: Needs assistance         Standing balance support: During functional activity, Reliant on assistive device for balance Standing balance-Leahy Scale: Fair                              Cognition Arousal/Alertness: Awake/alert Behavior During Therapy: WFL for tasks assessed/performed Overall Cognitive Status: Within Functional Limits for tasks assessed                                          Exercises Total Joint Exercises Ankle Circles/Pumps: AROM, Right, 15 reps Quad Sets: AROM, Right, 15 reps Heel Slides: AAROM, Right, 15 reps Hip ABduction/ADduction: AROM, Right, 15 reps    General Comments        Pertinent Vitals/Pain Pain Assessment Pain Assessment: 0-10 Pain Score: 5  Pain Location: R thigh Pain Descriptors / Indicators: Discomfort, Sore Pain Intervention(s): Monitored during session, Ice applied, Repositioned    Home Living                          Prior Function  PT Goals (current goals can now be found in the care plan section) Progress towards PT goals: Progressing toward goals    Frequency    7X/week      PT Plan Current plan remains appropriate    Co-evaluation              AM-PAC PT "6 Clicks" Mobility   Outcome Measure  Help needed turning from your back to your side while in a flat bed without using bedrails?: A Little Help needed moving from lying on your back to sitting on the side of a flat bed without using bedrails?: A Little Help needed moving to and from a bed to a chair (including a  wheelchair)?: A Little Help needed standing up from a chair using your arms (e.g., wheelchair or bedside chair)?: A Little Help needed to walk in hospital room?: A Little   6 Click Score: 15    End of Session Equipment Utilized During Treatment: Gait belt (L prosthesis) Activity Tolerance: Patient tolerated treatment well Patient left: in chair;with call bell/phone within reach;with chair alarm set   PT Visit Diagnosis: Other abnormalities of gait and mobility (R26.89);Difficulty in walking, not elsewhere classified (R26.2)     Time: [id number] PT Time Calculation (min) (ACUTE ONLY): 36 min  Charges:  $Gait Training: 8-22 mins $Therapeutic Exercise: 8-22 mins                        Doreatha Massed, PT Acute Rehabilitation  Office: [phone]

## 2022-09-16 NOTE — Plan of Care (Signed)
  Problem: Activity: Goal: Ability to avoid complications of mobility impairment will improve Outcome: Progressing   Problem: Pain Management: Goal: Pain level will decrease with appropriate interventions Outcome: Progressing   Problem: Activity: Goal: Risk for activity intolerance will decrease Outcome: Progressing   

## 2022-09-16 NOTE — Progress Notes (Signed)
Physical Therapy Treatment Patient Details Name: Carl Vang MRN: [id number] DOB: [birth year] Today's Date: 09/16/2022   History of Present Illness 62 yo male s/p R THA DA 09/14/22. Hx of L BKA, R TKA 2019    PT Comments    Progressing with mobility. Moderate pain with activity. Pt tolerated increased distance well. Will continue to progress activity as tolerated.    Recommendations for follow up therapy are one component of a multi-disciplinary discharge planning process, led by the attending physician.  Recommendations may be updated based on patient status, additional functional criteria and insurance authorization.  Follow Up Recommendations  Follow physician's recommendations for discharge plan and follow up therapies     Assistance Recommended at Discharge Intermittent Supervision/Assistance  Patient can return home with the following A little help with walking and/or transfers;A little help with bathing/dressing/bathroom;Assistance with cooking/housework;Assist for transportation;Help with stairs or ramp for entrance   Equipment Recommendations  Rolling walker (2 wheels)    Recommendations for Other Services       Precautions / Restrictions Precautions Precautions: Fall Precaution Comments: L BKA-has prosthesis Restrictions Weight Bearing Restrictions: No RLE Weight Bearing: Weight bearing as tolerated     Mobility  Bed Mobility Overal bed mobility: Needs Assistance Bed Mobility: Sit to Supine      Sit to supine: Min guard   General bed mobility comments: Pt used gait belt as leg lifter. Increased time. Cues provided.    Transfers Overall transfer level: Needs assistance Equipment used: Rolling walker (2 wheels) Transfers: Sit to/from Stand Sit to Stand: Min assist           General transfer comment: Assist to steady while rising this session. Cues for safety, hand placement    Ambulation/Gait Ambulation/Gait assistance: Min guard Gait Distance  (Feet): 225 Feet Assistive device: Rolling walker (2 wheels) Gait Pattern/deviations: Step-through pattern, Decreased stride length, Trunk flexed       General Gait Details: Min guard A for safety. Slow but steady gait. Cues for posture, RW proximity.   Stairs             Wheelchair Mobility    Modified Rankin (Stroke Patients Only)       Balance Overall balance assessment: Needs assistance         Standing balance support: During functional activity, Reliant on assistive device for balance Standing balance-Leahy Scale: Fair                              Cognition Arousal/Alertness: Awake/alert Behavior During Therapy: WFL for tasks assessed/performed Overall Cognitive Status: Within Functional Limits for tasks assessed                                          Exercises Total Joint Exercises Ankle Circles/Pumps: AROM, Right, 15 reps Quad Sets: AROM, Right, 15 reps Heel Slides: AAROM, Right, 15 reps Hip ABduction/ADduction: AROM, Right, 15 reps    General Comments        Pertinent Vitals/Pain Pain Assessment Pain Assessment: 0-10 Pain Score: 5  Pain Location: R thigh Pain Descriptors / Indicators: Discomfort, Sore Pain Intervention(s): Monitored during session, Repositioned, Ice applied    Home Living                          Prior Function  PT Goals (current goals can now be found in the care plan section) Progress towards PT goals: Progressing toward goals    Frequency    7X/week      PT Plan Current plan remains appropriate    Co-evaluation              AM-PAC PT "6 Clicks" Mobility   Outcome Measure  Help needed turning from your back to your side while in a flat bed without using bedrails?: A Little Help needed moving from lying on your back to sitting on the side of a flat bed without using bedrails?: A Little Help needed moving to and from a bed to a chair (including a  wheelchair)?: A Little Help needed standing up from a chair using your arms (e.g., wheelchair or bedside chair)?: A Little Help needed to walk in hospital room?: A Little Help needed climbing 3-5 steps with a railing? : A Little 6 Click Score: 18    End of Session Equipment Utilized During Treatment: Gait belt (L prosthesis) Activity Tolerance: Patient tolerated treatment well Patient left: in bed;with call bell/phone within reach;with bed alarm set   PT Visit Diagnosis: Other abnormalities of gait and mobility (R26.89);Difficulty in walking, not elsewhere classified (R26.2)     Time: [id number] PT Time Calculation (min) (ACUTE ONLY): 17 min  Charges:  $Gait Training: 8-22 mins $Therapeutic Exercise: 8-22 mins                         Doreatha Massed, PT Acute Rehabilitation  Office: [phone]

## 2022-09-16 NOTE — Plan of Care (Signed)
  Problem: Pain Management: Goal: Pain level will decrease with appropriate interventions Outcome: Progressing   

## 2022-09-17 ENCOUNTER — Other Ambulatory Visit (HOSPITAL_COMMUNITY): Payer: Self-pay

## 2022-09-17 MED ORDER — CELECOXIB 200 MG PO CAPS
200.0000 mg | ORAL_CAPSULE | Freq: Two times a day (BID) | ORAL | 0 refills | Status: DC | PRN
  Filled 2022-09-17: qty 60, 30d supply, fill #0

## 2022-09-17 MED ORDER — DOCUSATE SODIUM 100 MG PO CAPS: 100.0000 mg | ORAL_CAPSULE | Freq: Two times a day (BID) | ORAL | 0 refills | Status: DC | PRN

## 2022-09-17 MED ORDER — CELECOXIB 200 MG PO CAPS: 200.0000 mg | ORAL_CAPSULE | Freq: Two times a day (BID) | ORAL | 0 refills | Status: DC

## 2022-09-17 MED ORDER — TIZANIDINE HCL 4 MG PO TABS
4.0000 mg | ORAL_TABLET | Freq: Three times a day (TID) | ORAL | 0 refills | Status: DC | PRN
  Filled 2022-09-17: qty 40, 14d supply, fill #0

## 2022-09-17 MED ORDER — ASPIRIN 325 MG PO TBEC: 325.0000 mg | DELAYED_RELEASE_TABLET | Freq: Two times a day (BID) | ORAL | 0 refills | Status: DC

## 2022-09-17 MED ORDER — ASPIRIN 325 MG PO TABS: 325.0000 mg | ORAL_TABLET | Freq: Two times a day (BID) | ORAL | 0 refills | Status: DC

## 2022-09-17 MED ORDER — OXYCODONE HCL 5 MG PO TABS: 5.0000 mg | ORAL_TABLET | ORAL | 0 refills | Status: DC | PRN

## 2022-09-17 MED ORDER — OXYCODONE HCL 5 MG PO TABS
5.0000 mg | ORAL_TABLET | ORAL | 0 refills | Status: DC | PRN
  Filled 2022-09-17: qty 40, 7d supply, fill #0

## 2022-09-17 MED ORDER — DOCUSATE SODIUM 100 MG PO CAPS: ORAL_CAPSULE | ORAL | 0 refills | Status: DC

## 2022-09-17 MED ORDER — TIZANIDINE HCL 4 MG PO TABS: 4.0000 mg | ORAL_TABLET | Freq: Three times a day (TID) | ORAL | 0 refills | Status: DC | PRN

## 2022-09-17 NOTE — Progress Notes (Signed)
Physical Therapy Treatment Patient Details Name: Carl Vang MRN: [id number] DOB: 1961/03/21 Today's Date: 09/17/2022   History of Present Illness 62 yo male s/p R THA DA 09/14/22. Hx of L BKA, R TKA 2019    PT Comments    Pt ambulated in hallway, practiced safe stair technique again and performed LE exercises.  Pt cautioned to wait for HHPT to perform standing exercises for safety (L BKA with prosthesis) however did perform today with RW and therapist min/guard.  Pt anticipates d/c home today pending a ride and plans to f/u with HHPT.    Recommendations for follow up therapy are one component of a multi-disciplinary discharge planning process, led by the attending physician.  Recommendations may be updated based on patient status, additional functional criteria and insurance authorization.  Follow Up Recommendations  Follow physician's recommendations for discharge plan and follow up therapies     Assistance Recommended at Discharge Intermittent Supervision/Assistance  Patient can return home with the following A little help with walking and/or transfers;A little help with bathing/dressing/bathroom;Assistance with cooking/housework;Assist for transportation;Help with stairs or ramp for entrance   Equipment Recommendations  Rolling walker (2 wheels)    Recommendations for Other Services       Precautions / Restrictions Precautions Precautions: Fall Precaution Comments: L BKA-has prosthesis Restrictions RLE Weight Bearing: Weight bearing as tolerated     Mobility  Bed Mobility Overal bed mobility: Needs Assistance Bed Mobility: Sit to Supine     Supine to sit: Supervision, HOB elevated Sit to supine: Supervision   General bed mobility comments: Pt used gait belt as leg lifter. Increased time.    Transfers Overall transfer level: Needs assistance Equipment used: Rolling walker (2 wheels) Transfers: Sit to/from Stand Sit to Stand: Supervision           General  transfer comment: cues for safe technique    Ambulation/Gait Ambulation/Gait assistance: Supervision, Min guard Gait Distance (Feet): 400 Feet Assistive device: Rolling walker (2 wheels) Gait Pattern/deviations: Step-through pattern, Decreased stride length, Trunk flexed       General Gait Details: slow but steady gait, reliance on UE support through RW, cues for RW positioning   Stairs Stairs: Yes Stairs assistance: Min guard, Supervision Stair Management: Step to pattern, Forwards, One rail Right Number of Stairs: 2 General stair comments: verbal cues for safety and sequence, both hands on right rail, no physical assist required   Wheelchair Mobility    Modified Rankin (Stroke Patients Only)       Balance                                            Cognition Arousal/Alertness: Awake/alert Behavior During Therapy: WFL for tasks assessed/performed Overall Cognitive Status: Within Functional Limits for tasks assessed                                          Exercises Total Joint Exercises Ankle Circles/Pumps: AROM, Right, 10 reps Quad Sets: AROM, Right, 10 reps Short Arc Quad: AROM, Right, 10 reps Heel Slides: AAROM, Right, 10 reps Hip ABduction/ADduction: AAROM, Standing, Supine, 10 reps, Right Long Arc Quad: AROM, Limitations, 10 reps, Right Long Arc Quad Limitations: pt performs more heel slide motion then LAQ since scooted to edge of chair Knee Flexion: AROM,  Standing, Right, 10 reps Marching in Standing: AROM, Right, Standing, 10 reps Standing Hip Extension: AROM, Right, Standing, 10 reps    General Comments        Pertinent Vitals/Pain Pain Assessment Pain Assessment: 0-10 Pain Score: 5  Pain Location: R thigh Pain Descriptors / Indicators: Discomfort, Sore Pain Intervention(s): Repositioned, Monitored during session    Home Living                          Prior Function            PT Goals  (current goals can now be found in the care plan section) Progress towards PT goals: Progressing toward goals    Frequency    7X/week      PT Plan Current plan remains appropriate    Co-evaluation              AM-PAC PT "6 Clicks" Mobility   Outcome Measure  Help needed turning from your back to your side while in a flat bed without using bedrails?: A Little Help needed moving from lying on your back to sitting on the side of a flat bed without using bedrails?: A Little Help needed moving to and from a bed to a chair (including a wheelchair)?: A Little Help needed standing up from a chair using your arms (e.g., wheelchair or bedside chair)?: A Little Help needed to walk in hospital room?: A Little Help needed climbing 3-5 steps with a railing? : A Little 6 Click Score: 18    End of Session Equipment Utilized During Treatment: Gait belt Activity Tolerance: Patient tolerated treatment well Patient left: with call bell/phone within reach;in bed Nurse Communication: Mobility status PT Visit Diagnosis: Other abnormalities of gait and mobility (R26.89);Difficulty in walking, not elsewhere classified (R26.2)     Time: [id number] PT Time Calculation (min) (ACUTE ONLY): 22 min  Charges:  $Therapeutic Exercise: 8-22 mins           Jannette Spanner PT, DPT Physical Therapist Acute Rehabilitation Services Preferred contact method: Secure Chat Weekend Pager Only: [phone] Office: [place] 09/17/2022, 2:55 PM

## 2022-09-17 NOTE — Plan of Care (Signed)
  Problem: Education: Goal: Knowledge of the prescribed therapeutic regimen will improve Outcome: Adequate for Discharge Goal: Understanding of discharge needs will improve Outcome: Adequate for Discharge   Problem: Activity: Goal: Ability to avoid complications of mobility impairment will improve Outcome: Adequate for Discharge Goal: Ability to tolerate increased activity will improve Outcome: Adequate for Discharge   Problem: Clinical Measurements: Goal: Postoperative complications will be avoided or minimized Outcome: Adequate for Discharge   Problem: Pain Management: Goal: Pain level will decrease with appropriate interventions Outcome: Adequate for Discharge   Problem: Skin Integrity: Goal: Will show signs of wound healing Outcome: Adequate for Discharge   Problem: Education: Goal: Knowledge of General Education information will improve Description: Including pain rating scale, medication(s)/side effects and non-pharmacologic comfort measures Outcome: Adequate for Discharge   Problem: Health Behavior/Discharge Planning: Goal: Ability to manage health-related needs will improve Outcome: Adequate for Discharge   Problem: Clinical Measurements: Goal: Ability to maintain clinical measurements within normal limits will improve Outcome: Adequate for Discharge Goal: Will remain free from infection Outcome: Adequate for Discharge Goal: Diagnostic test results will improve Outcome: Adequate for Discharge Goal: Respiratory complications will improve Outcome: Adequate for Discharge Goal: Cardiovascular complication will be avoided Outcome: Adequate for Discharge   Problem: Activity: Goal: Risk for activity intolerance will decrease 09/17/2022 1720 by Iver Nestle A, LPN Outcome: Adequate for Discharge 09/17/2022 0755 by Lise Auer, LPN Outcome: Progressing   Problem: Coping: Goal: Level of anxiety will decrease Outcome: Adequate for Discharge   Problem:  Elimination: Goal: Will not experience complications related to bowel motility Outcome: Adequate for Discharge Goal: Will not experience complications related to urinary retention Outcome: Adequate for Discharge   Problem: Pain Managment: Goal: General experience of comfort will improve 09/17/2022 1720 by Lise Auer, LPN Outcome: Adequate for Discharge 09/17/2022 0755 by Lise Auer, LPN Outcome: Progressing   Problem: Safety: Goal: Ability to remain free from injury will improve 09/17/2022 1720 by Lise Auer, LPN Outcome: Adequate for Discharge 09/17/2022 0755 by Lise Auer, LPN Outcome: Progressing   Problem: Skin Integrity: Goal: Risk for impaired skin integrity will decrease Outcome: Adequate for Discharge

## 2022-09-17 NOTE — Progress Notes (Signed)
Physical Therapy Treatment Patient Details Name: Carl Vang MRN: [id number] DOB: 12-Oct-1960 Today's Date: 09/17/2022   History of Present Illness 62 yo male s/p R THA DA 09/14/22. Hx of L BKA, R TKA 2019    PT Comments    Pt ambulated in hallway and practiced safe stair technique.  Pt reports he is staying until Sunday however pt mobilizing well and on track to meet goals today so notified RN.  Will return to ambulate again and perform exercises.   Recommendations for follow up therapy are one component of a multi-disciplinary discharge planning process, led by the attending physician.  Recommendations may be updated based on patient status, additional functional criteria and insurance authorization.  Follow Up Recommendations  Follow physician's recommendations for discharge plan and follow up therapies     Assistance Recommended at Discharge Intermittent Supervision/Assistance  Patient can return home with the following A little help with walking and/or transfers;A little help with bathing/dressing/bathroom;Assistance with cooking/housework;Assist for transportation;Help with stairs or ramp for entrance   Equipment Recommendations  Rolling walker (2 wheels)    Recommendations for Other Services       Precautions / Restrictions Precautions Precautions: Fall Precaution Comments: L BKA-has prosthesis Restrictions RLE Weight Bearing: Weight bearing as tolerated     Mobility  Bed Mobility Overal bed mobility: Needs Assistance Bed Mobility: Supine to Sit     Supine to sit: Supervision, HOB elevated     General bed mobility comments: Pt used gait belt as leg lifter. Increased time.    Transfers Overall transfer level: Needs assistance Equipment used: Rolling walker (2 wheels) Transfers: Sit to/from Stand Sit to Stand: Min guard           General transfer comment: cues for safe technique    Ambulation/Gait Ambulation/Gait assistance: Min guard Gait Distance  (Feet): 280 Feet Assistive device: Rolling walker (2 wheels) Gait Pattern/deviations: Step-through pattern, Decreased stride length, Trunk flexed       General Gait Details: slow but steady gait, reliance on UE support through RW, cues for RW positioning   Stairs Stairs: Yes Stairs assistance: Min guard Stair Management: Step to pattern, Forwards, One rail Right Number of Stairs: 3 General stair comments: verbal cues for safety and sequence, both hands on right rail, no physical assist required   Wheelchair Mobility    Modified Rankin (Stroke Patients Only)       Balance                                            Cognition Arousal/Alertness: Awake/alert Behavior During Therapy: WFL for tasks assessed/performed Overall Cognitive Status: Within Functional Limits for tasks assessed                                          Exercises      General Comments        Pertinent Vitals/Pain Pain Assessment Pain Assessment: 0-10 Pain Score: 6  Pain Location: R thigh Pain Descriptors / Indicators: Discomfort, Sore Pain Intervention(s): Repositioned, Monitored during session    Home Living                          Prior Function  PT Goals (current goals can now be found in the care plan section) Progress towards PT goals: Progressing toward goals    Frequency    7X/week      PT Plan Current plan remains appropriate    Co-evaluation              AM-PAC PT "6 Clicks" Mobility   Outcome Measure  Help needed turning from your back to your side while in a flat bed without using bedrails?: A Little Help needed moving from lying on your back to sitting on the side of a flat bed without using bedrails?: A Little Help needed moving to and from a bed to a chair (including a wheelchair)?: A Little Help needed standing up from a chair using your arms (e.g., wheelchair or bedside chair)?: A Little Help  needed to walk in hospital room?: A Little Help needed climbing 3-5 steps with a railing? : A Little 6 Click Score: 18    End of Session Equipment Utilized During Treatment: Gait belt Activity Tolerance: Patient tolerated treatment well Patient left: in chair;with call bell/phone within reach;with chair alarm set   PT Visit Diagnosis: Other abnormalities of gait and mobility (R26.89);Difficulty in walking, not elsewhere classified (R26.2)     Time: [id number] PT Time Calculation (min) (ACUTE ONLY): 19 min  Charges:  $Gait Training: 8-22 mins                    Jannette Spanner PT, DPT Physical Therapist Acute Rehabilitation Services Preferred contact method: Secure Chat Weekend Pager Only: [phone] Office: [place] 09/17/2022, 2:49 PM

## 2022-09-17 NOTE — Discharge Summary (Signed)
Patient ID: SIG CLISHAM MRN: [id number] DOB/AGE: January 31, 1961 62 y.o.  Admit date: 09/14/2022 Discharge date: 09/17/2022  Admission Diagnoses:  Principal Problem:   Status post total hip replacement, right Active Problems:   Primary osteoarthritis of right hip   Discharge Diagnoses:  Same  Past Medical History:  Diagnosis Date   Hyperlipidemia    Hypertension    Osteoarthritis of knee    Pre-diabetes    pt denies   Prediabetes     Surgeries: Procedure(s):Right TOTAL HIP ARTHROPLASTY ANTERIOR APPROACH on 09/14/2022   Consultants:   Discharged Condition: Improved  Hospital Course: Carl Vang is an 62 y.o. male who was admitted 09/14/2022 for operative treatment ofStatus post total hip replacement, right. Patient has severe unremitting pain that affects sleep, daily activities, and work/hobbies. After pre-op clearance the patient was taken to the operating room on 09/14/2022 and underwent  Procedure(s):Right TOTAL HIP ARTHROPLASTY ANTERIOR APPROACH.    Patient was given perioperative antibiotics:  Anti-infectives (From admission, onward)    Start     Dose/Rate Route Frequency Ordered Stop   09/14/22 1845  ceFAZolin (ANCEF) IVPB 2g/100 mL premix        2 g 200 mL/hr over 30 Minutes Intravenous Every 6 hours 09/14/22 1753 09/15/22 0132   09/14/22 1030  ceFAZolin (ANCEF) IVPB 2g/100 mL premix        2 g 200 mL/hr over 30 Minutes Intravenous On call to O.R. 09/14/22 1026 09/14/22 1321        Patient was given sequential compression devices, early ambulation, and chemoprophylaxis to prevent DVT.  Patient benefited maximally from hospital stay and there were no complications.    Recent vital signs: Patient Vitals for the past 24 hrs:  BP Temp Temp src Pulse Resp SpO2  09/17/22 1303 122/65 97.9 F (36.6 C) -- 71 20 98 %  09/17/22 0945 120/65 -- -- -- -- --  09/17/22 0613 112/81 97.8 F (36.6 C) Oral 72 16 97 %  09/16/22 2204 127/67 (!) 97.2 F (36.2 C) -- 73 16 --      Recent laboratory studies:  Recent Labs    09/15/22 0321 09/16/22 0346  WBC 14.0* 18.9*  HGB 11.6* 12.0*  HCT 35.0* 35.9*  PLT 217 227     Discharge Medications:   Allergies as of 09/17/2022   No Known Allergies      Medication List     STOP taking these medications    meloxicam 15 MG tablet Commonly known as: MOBIC       TAKE these medications    acetaminophen-codeine 300-30 MG tablet Commonly known as: TYLENOL #3 Take 1 tablet by mouth every 12 (twelve) hours as needed for moderate pain.   amLODipine 10 MG tablet Commonly known as: NORVASC Take 1 tablet (10 mg total) by mouth daily.   aspirin EC 325 MG tablet Take 1 tablet (325 mg total) by mouth 2 (two) times daily after a meal.   atorvastatin 20 MG tablet Commonly known as: LIPITOR Take 1 tablet (20 mg total) by mouth every evening.   celecoxib 200 MG capsule Commonly known as: CeleBREX Take 1 capsule (200 mg total) by mouth 2 (two) times daily.   chlorthalidone 25 MG tablet Commonly known as: HYGROTON Take 1 tablet (25 mg total) by mouth daily.   docusate sodium 100 MG capsule Commonly known as: Colace Take 1 capsule (100 mg total) by mouth 2 (two) times daily as needed (for constipation).   oxyCODONE 5 MG immediate release  tablet Commonly known as: Roxicodone Take 1 tablet (5 mg total) by mouth every 4 (four) hours as needed for severe pain.   tiZANidine 4 MG tablet Commonly known as: Zanaflex Take 1 tablet (4 mg total) by mouth 3 (three) times daily as needed for muscle spasms.               Durable Medical Equipment  (From admission, onward)           Start     Ordered   09/14/22 1754  DME Walker rolling  Once       Question:  Patient needs a walker to treat with the following condition  Answer:  Primary osteoarthritis of right hip   09/14/22 1753   09/14/22 1754  DME 3 n 1  Once        09/14/22 1753            Diagnostic Studies: DG HIP UNILAT WITH PELVIS 1V  RIGHT  Result Date: 09/14/2022 CLINICAL DATA:  Right hip arthroplasty EXAM: DG HIP (WITH OR WITHOUT PELVIS) 1V RIGHT COMPARISON:  03/22/2004 FINDINGS: Spot fluoroscopic views demonstrate right total hip arthroplasty. Components appear aligned in the frontal plane. No complicating feature. Visualized pelvis intact. Included left hip unremarkable. IMPRESSION: Expected postoperative appearance status post right total hip arthroplasty. Electronically Signed   By: Jerilynn Mages.  Shick M.D.   On: 09/14/2022 15:08   DG C-Arm 1-60 Min-No Report  Result Date: 09/14/2022 Fluoroscopy was utilized by the requesting physician.  No radiographic interpretation.   DG C-Arm 1-60 Min-No Report  Result Date: 09/14/2022 Fluoroscopy was utilized by the requesting physician.  No radiographic interpretation.   DG Chest 2 View  Result Date: 09/01/2022 CLINICAL DATA:  Preoperative exam EXAM: CHEST - 2 VIEW COMPARISON:  Dose radiograph 11 2319 FINDINGS: Stable cardiac and mediastinal contours. Aortic tortuosity. Minimal heterogeneous opacities lung bases. No pleural effusion or pneumothorax. Thoracic spine degenerative changes. IMPRESSION: Basilar atelectasis. Electronically Signed   By: Lovey Newcomer M.D.   On: 09/01/2022 20:42    Disposition: Discharge disposition: 01-Home or Self Care       Discharge Instructions     Call MD for:  difficulty breathing, headache or visual disturbances   Complete by: As directed    Call MD for:  difficulty breathing, headache or visual disturbances   Complete by: As directed    Call MD for:  persistant nausea and vomiting   Complete by: As directed    Call MD for:  persistant nausea and vomiting   Complete by: As directed    Call MD for:  severe uncontrolled pain   Complete by: As directed    Call MD for:  severe uncontrolled pain   Complete by: As directed    Call MD for:  temperature >100.4   Complete by: As directed    Call MD for:  temperature >100.4   Complete by: As  directed    Diet - low sodium heart healthy   Complete by: As directed    Discharge instructions   Complete by: As directed    INSTRUCTIONS AFTER JOINT REPLACEMENT   Remove items at home which could result in a fall. This includes throw rugs or furniture in walking pathways ICE to the affected joint every three hours while awake for 30 minutes at a time, for at least the first 3-5 days, and then as needed for pain and swelling.  Continue to use ice for pain and swelling. You may notice swelling  that will progress down to the foot and ankle.  This is normal after surgery.  Elevate your leg when you are not up walking on it.   Continue to use the breathing machine you got in the hospital (incentive spirometer) which will help keep your temperature down.  It is common for your temperature to cycle up and down following surgery, especially at night when you are not up moving around and exerting yourself.  The breathing machine keeps your lungs expanded and your temperature down.   DIET:  As you were doing prior to hospitalization, we recommend a well-balanced diet.  DRESSING / WOUND CARE / SHOWERING  Keep the surgical dressing until follow up.  The dressing is water proof, so you can shower without any extra covering.  IF THE DRESSING FALLS OFF or the wound gets wet inside, change the dressing with sterile gauze.  Please use good hand washing techniques before changing the dressing.  Do not use any lotions or creams on the incision until instructed by your surgeon.    ACTIVITY  Increase activity slowly as tolerated, but follow the weight bearing instructions below.   No driving for 6 weeks or until further direction given by your physician.  You cannot drive while taking narcotics.  No lifting or carrying greater than 10 lbs. until further directed by your surgeon. Avoid periods of inactivity such as sitting longer than an hour when not asleep. This helps prevent blood clots.  You may return to  work once you are authorized by your doctor.   WEIGHT BEARING  Weight bearing as tolerated with assist device (walker, cane, etc) as directed, use it as long as suggested by your surgeon or therapist, typically at least 4-6 weeks.  EXERCISES Results after joint replacement surgery are often greatly improved when you follow the exercise, range of motion and muscle strengthening exercises prescribed by your doctor. Safety measures are also important to protect the joint from further injury. Any time any of these exercises cause you to have increased pain or swelling, decrease what you are doing until you are comfortable again and then slowly increase them. If you have problems or questions, call your caregiver or physical therapist for advice.   Rehabilitation is important following a joint replacement. After just a few days of immobilization, the muscles of the leg can become weakened and shrink (atrophy).  These exercises are designed to build up the tone and strength of the thigh and leg muscles and to improve motion. Often times heat used for twenty to thirty minutes before working out will loosen up your tissues and help with improving the range of motion but do not use heat for the first two weeks following surgery (sometimes heat can increase post-operative swelling).   These exercises can be done on a training (exercise) mat, on the floor, on a table or on a bed. Use whatever works the best and is most comfortable for you.    Use music or television while you are exercising so that the exercises are a pleasant break in your day. This will make your life better with the exercises acting as a break in your routine that you can look forward to.   Perform all exercises about fifteen times, three times per day or as directed.  You should exercise both the operative leg and the other leg as well.  Exercises include:   Quad Sets - Tighten up the muscle on the front of the thigh [employer]) and hold for  5-10  seconds.   Straight Leg Raises - With your knee straight (if you were given a brace, keep it on), lift the leg to 60 degrees, hold for 3 seconds, and slowly lower the leg.  Perform this exercise against resistance later as your leg gets stronger.  Leg Slides: Lying on your back, slowly slide your foot toward your buttocks, bending your knee up off the floor (only go as far as is comfortable). Then slowly slide your foot back down until your leg is flat on the floor again.  Angel Wings: Lying on your back spread your legs to the side as far apart as you can without causing discomfort.  Hamstring Strength:  Lying on your back, push your heel against the floor with your leg straight by tightening up the muscles of your buttocks.  Repeat, but this time bend your knee to a comfortable angle, and push your heel against the floor.  You may put a pillow under the heel to make it more comfortable if necessary.   A rehabilitation program following joint replacement surgery can speed recovery and prevent re-injury in the future due to weakened muscles. Contact your doctor or a physical therapist for more information on knee rehabilitation.    CONSTIPATION Constipation is defined medically as fewer than three stools per week and severe constipation as less than one stool per week.  Even if you have a regular bowel pattern at home, your normal regimen is likely to be disrupted due to multiple reasons following surgery.  Combination of anesthesia, postoperative narcotics, change in appetite and fluid intake all can affect your bowels.   YOU MUST use at least one of the following options; they are listed in order of increasing strength to get the job done.  They are all available over the counter, and you may need to use some, POSSIBLY even all of these options:    Drink plenty of fluids (prune juice may be helpful) and high fiber foods Colace 100 mg by mouth twice a day  Senokot for constipation as directed and as  needed Dulcolax (bisacodyl), take with full glass of water  Miralax (polyethylene glycol) once or twice a day as needed.  If you have tried all these things and are unable to have a bowel movement in the first 3-4 days after surgery call either your surgeon or your primary doctor.    If you experience loose stools or diarrhea, hold the medications until you stool forms back up.  If your symptoms do not get better within 1 week or if they get worse, check with your doctor.  If you experience "the worst abdominal pain ever" or develop nausea or vomiting, please contact the office immediately for further recommendations for treatment.  ITCHING:  If you experience itching with your medications, try taking only a single pain pill, or even half a pain pill at a time.  You can also use Benadryl over the counter for itching or also to help with sleep.   TED HOSE STOCKINGS:  Use stockings on both legs until for at least 2 weeks or as directed by physician office. They may be removed at night for sleeping.  MEDICATIONS:  See your medication summary on the "After Visit Summary" that nursing will review with you.  You may have some home medications which will be placed on hold until you complete the course of blood thinner medication.  It is important for you to complete the blood thinner medication as prescribed.  PRECAUTIONS:  If you experience chest pain or shortness of breath - call 911 immediately for transfer to the hospital emergency department.   If you develop a fever greater that 101 F, purulent drainage from wound, increased redness or drainage from wound, foul odor from the wound/dressing, or calf pain - CONTACT YOUR SURGEON.                                                   FOLLOW-UP APPOINTMENTS:  If you do not already have a post-op appointment, please call the office for an appointment to be seen by your surgeon.  Guidelines for how soon to be seen are listed in your "After Visit Summary", but  are typically between 1-4 weeks after surgery.  POST-OPERATIVE OPIOID TAPER INSTRUCTIONS: It is important to wean off of your opioid medication as soon as possible. If you do not need pain medication after your surgery it is ok to stop day one. Opioids include: Codeine, Hydrocodone(Norco, Vicodin), Oxycodone(Percocet, oxycontin) and hydromorphone amongst others.  Long term and even short term use of opiods can cause: Increased pain response Dependence Constipation Depression Respiratory depression And more.  Withdrawal symptoms can include Flu like symptoms Nausea, vomiting And more Techniques to manage these symptoms Hydrate well Eat regular healthy meals Stay active Use relaxation techniques(deep breathing, meditating, yoga) Do Not substitute Alcohol to help with tapering If you have been on opioids for less than two weeks and do not have pain than it is ok to stop all together.  Plan to wean off of opioids This plan should start within one week post op of your joint replacement. Maintain the same interval or time between taking each dose and first decrease the dose.  Cut the total daily intake of opioids by one tablet each day Next start to increase the time between doses. The last dose that should be eliminated is the evening dose.     MAKE SURE YOU:  Understand these instructions.  Get help right away if you are not doing well or get worse.    Thank you for letting us be a part of your medical care team.  It is a privilege we respect greatly.  We hope these instructions will help you stay on track for a fast and full recovery!   Discharge instructions   Complete by: As directed    INSTRUCTIONS AFTER JOINT REPLACEMENT   Remove items at home which could result in a fall. This includes throw rugs or furniture in walking pathways ICE to the affected joint every three hours while awake for 30 minutes at a time, for at least the first 3-5 days, and then as needed for pain  and swelling.  Continue to use ice for pain and swelling. You may notice swelling that will progress down to the foot and ankle.  This is normal after surgery.  Elevate your leg when you are not up walking on it.   Continue to use the breathing machine you got in the hospital (incentive spirometer) which will help keep your temperature down.  It is common for your temperature to cycle up and down following surgery, especially at night when you are not up moving around and exerting yourself.  The breathing machine keeps your lungs expanded and your temperature down.   DIET:  As you were doing prior to  hospitalization, we recommend a well-balanced diet.  DRESSING / WOUND CARE / SHOWERING  Keep the surgical dressing until follow up.  The dressing is water proof, so you can shower without any extra covering.  IF THE DRESSING FALLS OFF or the wound gets wet inside, change the dressing with sterile gauze.  Please use good hand washing techniques before changing the dressing.  Do not use any lotions or creams on the incision until instructed by your surgeon.    ACTIVITY  Increase activity slowly as tolerated, but follow the weight bearing instructions below.   No driving for 6 weeks or until further direction given by your physician.  You cannot drive while taking narcotics.  No lifting or carrying greater than 10 lbs. until further directed by your surgeon. Avoid periods of inactivity such as sitting longer than an hour when not asleep. This helps prevent blood clots.  You may return to work once you are authorized by your doctor.     WEIGHT BEARING   Weight bearing as tolerated with assist device (walker, cane, etc) as directed, use it as long as suggested by your surgeon or therapist, typically at least 4-6 weeks.   EXERCISES  Results after joint replacement surgery are often greatly improved when you follow the exercise, range of motion and muscle strengthening exercises prescribed by your  doctor. Safety measures are also important to protect the joint from further injury. Any time any of these exercises cause you to have increased pain or swelling, decrease what you are doing until you are comfortable again and then slowly increase them. If you have problems or questions, call your caregiver or physical therapist for advice.   Rehabilitation is important following a joint replacement. After just a few days of immobilization, the muscles of the leg can become weakened and shrink (atrophy).  These exercises are designed to build up the tone and strength of the thigh and leg muscles and to improve motion. Often times heat used for twenty to thirty minutes before working out will loosen up your tissues and help with improving the range of motion but do not use heat for the first two weeks following surgery (sometimes heat can increase post-operative swelling).   These exercises can be done on a training (exercise) mat, on the floor, on a table or on a bed. Use whatever works the best and is most comfortable for you.    Use music or television while you are exercising so that the exercises are a pleasant break in your day. This will make your life better with the exercises acting as a break in your routine that you can look forward to.   Perform all exercises about fifteen times, three times per day or as directed.  You should exercise both the operative leg and the other leg as well.  Exercises include:   Quad Sets - Tighten up the muscle on the front of the thigh (Quad) and hold for 5-10 seconds.   Straight Leg Raises - With your knee straight (if you were given a brace, keep it on), lift the leg to 60 degrees, hold for 3 seconds, and slowly lower the leg.  Perform this exercise against resistance later as your leg gets stronger.  Leg Slides: Lying on your back, slowly slide your foot toward your buttocks, bending your knee up off the floor (only go as far as is comfortable). Then slowly slide  your foot back down until your leg is flat on the floor again.  Angel Wings: Lying on your back spread your legs to the side as far apart as you can without causing discomfort.  Hamstring Strength:  Lying on your back, push your heel against the floor with your leg straight by tightening up the muscles of your buttocks.  Repeat, but this time bend your knee to a comfortable angle, and push your heel against the floor.  You may put a pillow under the heel to make it more comfortable if necessary.   A rehabilitation program following joint replacement surgery can speed recovery and prevent re-injury in the future due to weakened muscles. Contact your doctor or a physical therapist for more information on knee rehabilitation.    CONSTIPATION  Constipation is defined medically as fewer than three stools per week and severe constipation as less than one stool per week.  Even if you have a regular bowel pattern at home, your normal regimen is likely to be disrupted due to multiple reasons following surgery.  Combination of anesthesia, postoperative narcotics, change in appetite and fluid intake all can affect your bowels.   YOU MUST use at least one of the following options; they are listed in order of increasing strength to get the job done.  They are all available over the counter, and you may need to use some, POSSIBLY even all of these options:    Drink plenty of fluids (prune juice may be helpful) and high fiber foods Colace 100 mg by mouth twice a day  Senokot for constipation as directed and as needed Dulcolax (bisacodyl), take with full glass of water  Miralax (polyethylene glycol) once or twice a day as needed.  If you have tried all these things and are unable to have a bowel movement in the first 3-4 days after surgery call either your surgeon or your primary doctor.    If you experience loose stools or diarrhea, hold the medications until you stool forms back up.  If your symptoms do not  get better within 1 week or if they get worse, check with your doctor.  If you experience "the worst abdominal pain ever" or develop nausea or vomiting, please contact the office immediately for further recommendations for treatment.   ITCHING:  If you experience itching with your medications, try taking only a single pain pill, or even half a pain pill at a time.  You can also use Benadryl over the counter for itching or also to help with sleep.   TED HOSE STOCKINGS:  Use stockings on both legs until for at least 2 weeks or as directed by physician office. They may be removed at night for sleeping.  MEDICATIONS:  See your medication summary on the "After Visit Summary" that nursing will review with you.  You may have some home medications which will be placed on hold until you complete the course of blood thinner medication.  It is important for you to complete the blood thinner medication as prescribed.  PRECAUTIONS:  If you experience chest pain or shortness of breath - call 911 immediately for transfer to the hospital emergency department.   If you develop a fever greater that 101 F, purulent drainage from wound, increased redness or drainage from wound, foul odor from the wound/dressing, or calf pain - CONTACT YOUR SURGEON.  FOLLOW-UP APPOINTMENTS:  If you do not already have a post-op appointment, please call the office for an appointment to be seen by your surgeon.  Guidelines for how soon to be seen are listed in your "After Visit Summary", but are typically between 1-4 weeks after surgery.  OTHER INSTRUCTIONS:   Knee Replacement:  Do not place pillow under knee, focus on keeping the knee straight while resting. CPM instructions: 0-90 degrees, 2 hours in the morning, 2 hours in the afternoon, and 2 hours in the evening. Place foam block, curve side up under heel at all times except when in CPM or when walking.  DO NOT modify, tear, cut, or  change the foam block in any way.  POST-OPERATIVE OPIOID TAPER INSTRUCTIONS: It is important to wean off of your opioid medication as soon as possible. If you do not need pain medication after your surgery it is ok to stop day one. Opioids include: Codeine, Hydrocodone(Norco, Vicodin), Oxycodone(Percocet, oxycontin) and hydromorphone amongst others.  Long term and even short term use of opiods can cause: Increased pain response Dependence Constipation Depression Respiratory depression And more.  Withdrawal symptoms can include Flu like symptoms Nausea, vomiting And more Techniques to manage these symptoms Hydrate well Eat regular healthy meals Stay active Use relaxation techniques(deep breathing, meditating, yoga) Do Not substitute Alcohol to help with tapering If you have been on opioids for less than two weeks and do not have pain than it is ok to stop all together.  Plan to wean off of opioids This plan should start within one week post op of your joint replacement. Maintain the same interval or time between taking each dose and first decrease the dose.  Cut the total daily intake of opioids by one tablet each day Next start to increase the time between doses. The last dose that should be eliminated is the evening dose.     MAKE SURE YOU:  Understand these instructions.  Get help right away if you are not doing well or get worse.    Thank you for letting us be a part of your medical care team.  It is a privilege we respect greatly.  We hope these instructions will help you stay on track for a fast and full recovery!   Driving Restrictions   Complete by: As directed    -You should not drive a vehicle while taking pain medicines. -Do not drive a vehicle until you feel safe to do so.   Driving Restrictions   Complete by: As directed    Do not drive a vehicle until you feel safe to do so and you've stopped taking pain medication.   Increase activity slowly   Complete by:  As directed    Increase activity slowly   Complete by: As directed         Follow-up Information     Dorna Leitz, MD. Go on 09/29/2022.   Specialty: Orthopedic Surgery Why: your appointment is scheduled for 2:15 Contact information: [place] [address] [phone]         [facility] Follow up.   Specialty: [place] Why: HHPT will provide 6 home visits prior to starting outpatient physical therapy Contact information: [address] [place] [address] [phone]         [facility], [place]. Go on 09/29/2022.   Why: Your Outpatient physical therapy is scheduled for 3:20 Contact information: Physical Therapy [address] [place] [address] [phone]  Signed: Dereck Leep 09/17/2022, 5:14 PM

## 2022-09-17 NOTE — Progress Notes (Signed)
Subjective: 3 Days Post-Op Procedure(s) (LRB): TOTAL HIP ARTHROPLASTY ANTERIOR APPROACH (Right) Hospital day 3 status post right total hip arthroplasty. Patient reports pain as 5 on 0-10 scale. He states he wants to stay in the hospital until Sunday.  Objective: Vital signs in last 24 hours: Patient was resting comfortably in bed this morning.  He had some difficulty with a locked knee leg raise but could raise his leg and entered to off bed.He was neurovascularly intact distal to his surgery site.  His dressing appeared clean, dry and intact.  He was not bleeding through.    Temp:  [97.2 F (36.2 C)-97.8 F (36.6 C)] 97.8 F (36.6 C) (03/14 0613) Pulse Rate:  [69-73] 72 (03/14 0613) Resp:  [15-16] 16 (03/14 0613) BP: (112-127)/(65-81) 120/65 (03/14 0945) SpO2:  [96 %-97 %] 97 % (03/14 0613)  Intake/Output from previous day: 03/13 0701 - 03/14 0700 In: 480 [P.O.:480] Out: 900 [Urine:900] Intake/Output this shift: Total I/O In: -  Out: 250 [Urine:250]  Recent Labs    09/15/22 0321 09/16/22 0346  HGB 11.6* 12.0*   Recent Labs    09/15/22 0321 09/16/22 0346  WBC 14.0* 18.9*  RBC 4.08* 4.16*  HCT 35.0* 35.9*  PLT 217 227   No results for input(s): "NA", "K", "CL", "CO2", "BUN", "CREATININE", "GLUCOSE", "CALCIUM" in the last 72 hours. No results for input(s): "LABPT", "INR" in the last 72 hours.  Neurologically intact ABD soft Neurovascular intact Sensation intact distally   Assessment/Plan: 3 Days Post-Op Procedure(s) (LRB): TOTAL HIP ARTHROPLASTY ANTERIOR APPROACH (Right) Advance diet Up with therapy Discharge home with home health  [name] has done well postoperatively.  He has worked with physical therapy and is ambulating without a significant amount of difficulty.  At this point, I it appears that he is ready for discharge.  He has stated that his sister will look after him at home.  We will prepare her discharge orders for him today and plan to see him  back in clinic as scheduled in 2 weeks.   Anticipated LOS equal to or greater than 2 midnights due to - Age 62 and older with one or more of the following:  - Obesity  - Expected need for hospital services (PT, OT, Nursing) required for safe  discharge  - Anticipated need for postoperative skilled nursing care or inpatient rehab  - Active co-morbidities: None OR   - Unanticipated findings during/Post Surgery: None  - Patient is a high risk of re-admission due to: None   Dereck Leep 09/17/2022, 12:12 PM

## 2022-09-17 NOTE — Discharge Instructions (Signed)

## 2022-09-17 NOTE — Plan of Care (Signed)
  Problem: Activity: Goal: Risk for activity intolerance will decrease Outcome: Progressing   Problem: Pain Managment: Goal: General experience of comfort will improve Outcome: Progressing   Problem: Safety: Goal: Ability to remain free from injury will improve Outcome: Progressing   

## 2022-09-17 NOTE — TOC Progression Note (Addendum)
Transition of Care [facility]) - Progression Note   Patient Details  Name: Carl Vang MRN: [id number] Date of Birth: [birth year]  Transition of Care [facility]) CM/SW [place], LCSW Phone Number: 09/17/2022, 1:26 PM  Clinical Narrative: [facility] consulted for HH/DME. Patient is already prearranged with [facility] for HHPT as he is an ortho bundle. Patient declined a 3N1 as he already has one at home.  Addendum: Patient does not have a ride home. CSW received approval for taxi voucher, although he is a prearranged ortho bundle. CSW followed up with Joseph Art, RNCM, with [facility] regarding discharge needs.  Barriers to Discharge: No Barriers Identified  Expected Discharge Plan and Services Expected Discharge Date: 09/17/22               DME Arranged: [place] Rile rolling DME Agency: [employer] Representative spoke with at DME Agency: Prearranged in orthopedist's office HH Arranged: PT [place] Agency: Magazine features editor spoke with at [place]: Prearranged in orthopedist's office  Social Determinants of Health (SDOH) Interventions SDOH Screenings   Food Insecurity: No Food Insecurity (09/14/2022)  Housing: Low Risk  (09/14/2022)  Transportation Needs: No Transportation Needs (09/14/2022)  Utilities: Not At Risk (09/14/2022)  Alcohol Screen: Low Risk  (09/11/2022)  Depression (PHQ2-9): Low Risk  (09/11/2022)  Financial Resource Strain: Low Risk  (09/11/2022)  Physical Activity: Inactive (09/11/2022)  Stress: No Stress Concern Present (09/11/2022)  Tobacco Use: Low Risk  (09/16/2022)   Readmission Risk Interventions     No data to display

## 2022-09-18 ENCOUNTER — Telehealth: Payer: Self-pay

## 2022-09-18 DIAGNOSIS — R7303 Prediabetes: Secondary | ICD-10-CM | POA: Diagnosis not present

## 2022-09-18 DIAGNOSIS — I10 Essential (primary) hypertension: Secondary | ICD-10-CM | POA: Diagnosis not present

## 2022-09-18 DIAGNOSIS — Z7982 Long term (current) use of aspirin: Secondary | ICD-10-CM | POA: Diagnosis not present

## 2022-09-18 DIAGNOSIS — Z96653 Presence of artificial knee joint, bilateral: Secondary | ICD-10-CM | POA: Diagnosis not present

## 2022-09-18 DIAGNOSIS — Z471 Aftercare following joint replacement surgery: Secondary | ICD-10-CM | POA: Diagnosis not present

## 2022-09-18 DIAGNOSIS — E782 Mixed hyperlipidemia: Secondary | ICD-10-CM | POA: Diagnosis not present

## 2022-09-18 DIAGNOSIS — F32A Depression, unspecified: Secondary | ICD-10-CM | POA: Diagnosis not present

## 2022-09-18 DIAGNOSIS — Z96641 Presence of right artificial hip joint: Secondary | ICD-10-CM | POA: Diagnosis not present

## 2022-09-18 DIAGNOSIS — M1990 Unspecified osteoarthritis, unspecified site: Secondary | ICD-10-CM | POA: Diagnosis not present

## 2022-09-18 NOTE — Transitions of Care (Post Inpatient/ED Visit) (Signed)
   09/18/2022  Name: Carl Vang MRN: [id number] DOB: Sep 15, 1960  Today's TOC FU Call Status: Today's TOC FU Call Status:: Successful TOC FU Call Competed TOC FU Call Complete Date: 09/18/22  Transition Care Management Follow-up Telephone Call Date of Discharge: 09/17/22 Discharge Facility: [facility]) Type of Discharge: Inpatient Admission Primary Inpatient Discharge Diagnosis:: Primary osteoarthritis of right hip; S/P Total Hip Replacement How have you been since you were released from the hospital?: Better Any questions or concerns?: No  Items Reviewed: Did you receive and understand the discharge instructions provided?: Yes Medications obtained and verified?: Yes (Medications Reviewed) Any new allergies since your discharge?: No Dietary orders reviewed?: Yes Type of Diet Ordered:: Heart Healthy Do you have support at home?: Yes People in Home: other relative(s) Name of Support/Comfort Primary Source: [facility] and Equipment/Supplies: [place] Ordered?: Yes Name of [place]:: [place] set up a time to come to your home?: Yes [place] Visit Date: 09/18/22  Functional Questionnaire: Do you need assistance with bathing/showering or dressing?: Yes (walker) Do you need assistance with meal preparation?: No Do you need assistance with eating?: No Do you have difficulty maintaining continence: No Do you need assistance with getting out of bed/getting out of a chair/moving?: No Do you have difficulty managing or taking your medications?: No  Folllow up appointments reviewed: PCP Follow-up appointment confirmed?: NA Specialist Hospital Follow-up appointment confirmed?: Yes Date of Specialist follow-up appointment?: 09/29/22 Follow-Up Specialty Provider:: Dr. Berenice Primas- Orthopaedic Do you need transportation to your follow-up appointment?: No Do you understand care options if your condition(s) worsen?: Yes-patient  verbalized understanding  SDOH Interventions Today    Flowsheet Row Most Recent Value  SDOH Interventions   Food Insecurity Interventions Intervention Not Indicated  Housing Interventions Intervention Not Indicated  Transportation Interventions Intervention Not Indicated      Johnney Killian, RN, BSN, CCM Care Management Coordinator [facility] Phone: [phone]: [phone]

## 2022-09-20 DIAGNOSIS — Z471 Aftercare following joint replacement surgery: Secondary | ICD-10-CM | POA: Diagnosis not present

## 2022-09-20 DIAGNOSIS — F32A Depression, unspecified: Secondary | ICD-10-CM | POA: Diagnosis not present

## 2022-09-20 DIAGNOSIS — I10 Essential (primary) hypertension: Secondary | ICD-10-CM | POA: Diagnosis not present

## 2022-09-20 DIAGNOSIS — E782 Mixed hyperlipidemia: Secondary | ICD-10-CM | POA: Diagnosis not present

## 2022-09-20 DIAGNOSIS — Z96653 Presence of artificial knee joint, bilateral: Secondary | ICD-10-CM | POA: Diagnosis not present

## 2022-09-20 DIAGNOSIS — M1990 Unspecified osteoarthritis, unspecified site: Secondary | ICD-10-CM | POA: Diagnosis not present

## 2022-09-20 DIAGNOSIS — R7303 Prediabetes: Secondary | ICD-10-CM | POA: Diagnosis not present

## 2022-09-20 DIAGNOSIS — Z7982 Long term (current) use of aspirin: Secondary | ICD-10-CM | POA: Diagnosis not present

## 2022-09-20 DIAGNOSIS — Z96641 Presence of right artificial hip joint: Secondary | ICD-10-CM | POA: Diagnosis not present

## 2022-09-22 DIAGNOSIS — I10 Essential (primary) hypertension: Secondary | ICD-10-CM | POA: Diagnosis not present

## 2022-09-22 DIAGNOSIS — R269 Unspecified abnormalities of gait and mobility: Secondary | ICD-10-CM | POA: Diagnosis not present

## 2022-09-22 DIAGNOSIS — Z471 Aftercare following joint replacement surgery: Secondary | ICD-10-CM | POA: Diagnosis not present

## 2022-09-22 DIAGNOSIS — S88112S Complete traumatic amputation at level between knee and ankle, left lower leg, sequela: Secondary | ICD-10-CM | POA: Diagnosis not present

## 2022-09-22 DIAGNOSIS — M1990 Unspecified osteoarthritis, unspecified site: Secondary | ICD-10-CM | POA: Diagnosis not present

## 2022-09-22 DIAGNOSIS — F32A Depression, unspecified: Secondary | ICD-10-CM | POA: Diagnosis not present

## 2022-09-22 DIAGNOSIS — Z7982 Long term (current) use of aspirin: Secondary | ICD-10-CM | POA: Diagnosis not present

## 2022-09-22 DIAGNOSIS — E782 Mixed hyperlipidemia: Secondary | ICD-10-CM | POA: Diagnosis not present

## 2022-09-22 DIAGNOSIS — R7303 Prediabetes: Secondary | ICD-10-CM | POA: Diagnosis not present

## 2022-09-22 DIAGNOSIS — Z96641 Presence of right artificial hip joint: Secondary | ICD-10-CM | POA: Diagnosis not present

## 2022-09-22 DIAGNOSIS — Z96653 Presence of artificial knee joint, bilateral: Secondary | ICD-10-CM | POA: Diagnosis not present

## 2022-09-24 DIAGNOSIS — M1990 Unspecified osteoarthritis, unspecified site: Secondary | ICD-10-CM | POA: Diagnosis not present

## 2022-09-24 DIAGNOSIS — E782 Mixed hyperlipidemia: Secondary | ICD-10-CM | POA: Diagnosis not present

## 2022-09-24 DIAGNOSIS — R7303 Prediabetes: Secondary | ICD-10-CM | POA: Diagnosis not present

## 2022-09-24 DIAGNOSIS — Z96653 Presence of artificial knee joint, bilateral: Secondary | ICD-10-CM | POA: Diagnosis not present

## 2022-09-24 DIAGNOSIS — Z96641 Presence of right artificial hip joint: Secondary | ICD-10-CM | POA: Diagnosis not present

## 2022-09-24 DIAGNOSIS — I10 Essential (primary) hypertension: Secondary | ICD-10-CM | POA: Diagnosis not present

## 2022-09-24 DIAGNOSIS — Z7982 Long term (current) use of aspirin: Secondary | ICD-10-CM | POA: Diagnosis not present

## 2022-09-24 DIAGNOSIS — M1611 Unilateral primary osteoarthritis, right hip: Secondary | ICD-10-CM | POA: Diagnosis not present

## 2022-09-24 DIAGNOSIS — Z471 Aftercare following joint replacement surgery: Secondary | ICD-10-CM | POA: Diagnosis not present

## 2022-09-24 DIAGNOSIS — F32A Depression, unspecified: Secondary | ICD-10-CM | POA: Diagnosis not present

## 2022-09-25 DIAGNOSIS — Z96653 Presence of artificial knee joint, bilateral: Secondary | ICD-10-CM | POA: Diagnosis not present

## 2022-09-25 DIAGNOSIS — Z7982 Long term (current) use of aspirin: Secondary | ICD-10-CM | POA: Diagnosis not present

## 2022-09-25 DIAGNOSIS — Z471 Aftercare following joint replacement surgery: Secondary | ICD-10-CM | POA: Diagnosis not present

## 2022-09-25 DIAGNOSIS — Z96641 Presence of right artificial hip joint: Secondary | ICD-10-CM | POA: Diagnosis not present

## 2022-09-25 DIAGNOSIS — M1990 Unspecified osteoarthritis, unspecified site: Secondary | ICD-10-CM | POA: Diagnosis not present

## 2022-09-25 DIAGNOSIS — E782 Mixed hyperlipidemia: Secondary | ICD-10-CM | POA: Diagnosis not present

## 2022-09-25 DIAGNOSIS — R7303 Prediabetes: Secondary | ICD-10-CM | POA: Diagnosis not present

## 2022-09-25 DIAGNOSIS — I10 Essential (primary) hypertension: Secondary | ICD-10-CM | POA: Diagnosis not present

## 2022-09-25 DIAGNOSIS — F32A Depression, unspecified: Secondary | ICD-10-CM | POA: Diagnosis not present

## 2022-09-28 DIAGNOSIS — Z96641 Presence of right artificial hip joint: Secondary | ICD-10-CM | POA: Diagnosis not present

## 2022-09-28 DIAGNOSIS — M1990 Unspecified osteoarthritis, unspecified site: Secondary | ICD-10-CM | POA: Diagnosis not present

## 2022-09-28 DIAGNOSIS — Z471 Aftercare following joint replacement surgery: Secondary | ICD-10-CM | POA: Diagnosis not present

## 2022-09-28 DIAGNOSIS — F32A Depression, unspecified: Secondary | ICD-10-CM | POA: Diagnosis not present

## 2022-09-28 DIAGNOSIS — Z96653 Presence of artificial knee joint, bilateral: Secondary | ICD-10-CM | POA: Diagnosis not present

## 2022-09-28 DIAGNOSIS — I10 Essential (primary) hypertension: Secondary | ICD-10-CM | POA: Diagnosis not present

## 2022-09-28 DIAGNOSIS — E782 Mixed hyperlipidemia: Secondary | ICD-10-CM | POA: Diagnosis not present

## 2022-09-28 DIAGNOSIS — R7303 Prediabetes: Secondary | ICD-10-CM | POA: Diagnosis not present

## 2022-09-28 DIAGNOSIS — Z7982 Long term (current) use of aspirin: Secondary | ICD-10-CM | POA: Diagnosis not present

## 2022-09-30 DIAGNOSIS — R2681 Unsteadiness on feet: Secondary | ICD-10-CM | POA: Diagnosis not present

## 2022-09-30 DIAGNOSIS — R262 Difficulty in walking, not elsewhere classified: Secondary | ICD-10-CM | POA: Diagnosis not present

## 2022-09-30 DIAGNOSIS — M25651 Stiffness of right hip, not elsewhere classified: Secondary | ICD-10-CM | POA: Diagnosis not present

## 2022-10-05 DIAGNOSIS — M25651 Stiffness of right hip, not elsewhere classified: Secondary | ICD-10-CM | POA: Diagnosis not present

## 2022-10-05 DIAGNOSIS — R262 Difficulty in walking, not elsewhere classified: Secondary | ICD-10-CM | POA: Diagnosis not present

## 2022-10-05 DIAGNOSIS — R2681 Unsteadiness on feet: Secondary | ICD-10-CM | POA: Diagnosis not present

## 2022-10-13 ENCOUNTER — Other Ambulatory Visit (HOSPITAL_COMMUNITY): Payer: Self-pay

## 2022-10-13 MED ORDER — PHENAZOPYRIDINE HCL 100 MG PO TABS
200.0000 mg | ORAL_TABLET | Freq: Three times a day (TID) | ORAL | 0 refills | Status: DC | PRN
  Filled 2022-10-13: qty 40, 7d supply, fill #0

## 2022-10-14 ENCOUNTER — Other Ambulatory Visit: Payer: Self-pay

## 2022-10-14 ENCOUNTER — Emergency Department (HOSPITAL_COMMUNITY): Payer: 59

## 2022-10-14 ENCOUNTER — Encounter (HOSPITAL_COMMUNITY): Payer: Self-pay

## 2022-10-14 ENCOUNTER — Emergency Department (HOSPITAL_COMMUNITY)
Admission: EM | Admit: 2022-10-14 | Discharge: 2022-10-14 | Disposition: A | Discharge status: Home / Self Care | Payer: 59 | Attending: Emergency Medicine | Admitting: Emergency Medicine

## 2022-10-14 ENCOUNTER — Other Ambulatory Visit (HOSPITAL_COMMUNITY): Payer: Self-pay

## 2022-10-14 DIAGNOSIS — Z79899 Other long term (current) drug therapy: Secondary | ICD-10-CM | POA: Insufficient documentation

## 2022-10-14 DIAGNOSIS — N3000 Acute cystitis without hematuria: Secondary | ICD-10-CM | POA: Insufficient documentation

## 2022-10-14 DIAGNOSIS — Z96641 Presence of right artificial hip joint: Secondary | ICD-10-CM | POA: Diagnosis not present

## 2022-10-14 DIAGNOSIS — K8080 Other cholelithiasis without obstruction: Secondary | ICD-10-CM | POA: Diagnosis not present

## 2022-10-14 DIAGNOSIS — R000 Tachycardia, unspecified: Secondary | ICD-10-CM | POA: Diagnosis not present

## 2022-10-14 DIAGNOSIS — S70321A Blister (nonthermal), right thigh, initial encounter: Secondary | ICD-10-CM | POA: Diagnosis not present

## 2022-10-14 DIAGNOSIS — Z7982 Long term (current) use of aspirin: Secondary | ICD-10-CM | POA: Diagnosis not present

## 2022-10-14 DIAGNOSIS — X58XXXA Exposure to other specified factors, initial encounter: Secondary | ICD-10-CM | POA: Diagnosis not present

## 2022-10-14 DIAGNOSIS — K5900 Constipation, unspecified: Secondary | ICD-10-CM | POA: Insufficient documentation

## 2022-10-14 DIAGNOSIS — Z96651 Presence of right artificial knee joint: Secondary | ICD-10-CM | POA: Insufficient documentation

## 2022-10-14 DIAGNOSIS — K8020 Calculus of gallbladder without cholecystitis without obstruction: Secondary | ICD-10-CM | POA: Diagnosis not present

## 2022-10-14 DIAGNOSIS — I10 Essential (primary) hypertension: Secondary | ICD-10-CM | POA: Insufficient documentation

## 2022-10-14 DIAGNOSIS — R109 Unspecified abdominal pain: Secondary | ICD-10-CM | POA: Diagnosis not present

## 2022-10-14 DIAGNOSIS — I700 Atherosclerosis of aorta: Secondary | ICD-10-CM | POA: Diagnosis not present

## 2022-10-14 DIAGNOSIS — R300 Dysuria: Secondary | ICD-10-CM | POA: Diagnosis not present

## 2022-10-14 DIAGNOSIS — T148XXA Other injury of unspecified body region, initial encounter: Secondary | ICD-10-CM

## 2022-10-14 LAB — URINALYSIS, ROUTINE W REFLEX MICROSCOPIC
Bilirubin Urine: NEGATIVE
Glucose, UA: NEGATIVE mg/dL
Hgb urine dipstick: NEGATIVE
Ketones, ur: NEGATIVE mg/dL
Nitrite: POSITIVE — AB
Protein, ur: 30 mg/dL — AB
Specific Gravity, Urine: 1.036 — ABNORMAL HIGH (ref 1.005–1.030)
WBC, UA: >50 WBC/hpf (ref 0–5)
pH: 7 (ref 5.0–8.0)

## 2022-10-14 LAB — COMPREHENSIVE METABOLIC PANEL
ALT: 28 U/L (ref 0–44)
AST: 31 U/L (ref 15–41)
Albumin: 3.4 g/dL — ABNORMAL LOW (ref 3.5–5.0)
Alkaline Phosphatase: 61 U/L (ref 38–126)
Anion gap: 9 (ref 5–15)
BUN: 10 mg/dL (ref 8–23)
CO2: 26 mmol/L (ref 22–32)
Calcium: 8.5 mg/dL — ABNORMAL LOW (ref 8.9–10.3)
Chloride: 99 mmol/L (ref 98–111)
Creatinine, Ser: 0.93 mg/dL (ref 0.61–1.24)
GFR, Estimated: >60 mL/min (ref >60)
Glucose, Bld: 109 mg/dL — ABNORMAL HIGH (ref 70–99)
Potassium: 3.4 mmol/L — ABNORMAL LOW (ref 3.5–5.1)
Sodium: 134 mmol/L — ABNORMAL LOW (ref 135–145)
Total Bilirubin: 0.8 mg/dL (ref 0.3–1.2)
Total Protein: 6.8 g/dL (ref 6.5–8.1)

## 2022-10-14 LAB — CBC WITH DIFFERENTIAL/PLATELET
Abs Immature Granulocytes: 0.01 10*3/uL (ref 0.00–0.07)
Basophils Absolute: 0.1 10*3/uL (ref 0.0–0.1)
Basophils Relative: 1 %
Eosinophils Absolute: 0.4 10*3/uL (ref 0.0–0.5)
Eosinophils Relative: 6 %
HCT: 33.1 % — ABNORMAL LOW (ref 39.0–52.0)
Hemoglobin: 10.9 g/dL — ABNORMAL LOW (ref 13.0–17.0)
Immature Granulocytes: 0 %
Lymphocytes Relative: 20 %
Lymphs Abs: 1.3 10*3/uL (ref 0.7–4.0)
MCH: 27.6 pg (ref 26.0–34.0)
MCHC: 32.9 g/dL (ref 30.0–36.0)
MCV: 83.8 fL (ref 80.0–100.0)
Monocytes Absolute: 0.9 10*3/uL (ref 0.1–1.0)
Monocytes Relative: 13 %
Neutro Abs: 4 10*3/uL (ref 1.7–7.7)
Neutrophils Relative %: 60 %
Platelets: 297 10*3/uL (ref 150–400)
RBC: 3.95 MIL/uL — ABNORMAL LOW (ref 4.22–5.81)
RDW: 12.6 % (ref 11.5–15.5)
WBC: 6.7 10*3/uL (ref 4.0–10.5)
nRBC: 0 % (ref 0.0–0.2)

## 2022-10-14 LAB — LIPASE, BLOOD: Lipase: 29 U/L (ref 11–51)

## 2022-10-14 MED ORDER — IOHEXOL 300 MG/ML  SOLN
100.0000 mL | Freq: Once | INTRAMUSCULAR | Status: AC | PRN
  Administered 2022-10-14: 100 mL via INTRAVENOUS

## 2022-10-14 MED ORDER — SODIUM CHLORIDE 0.9 % IV SOLN
1.0000 g | Freq: Once | INTRAVENOUS | Status: AC
  Administered 2022-10-14: 1 g via INTRAVENOUS
  Filled 2022-10-14: qty 10

## 2022-10-14 MED ORDER — ONDANSETRON HCL 4 MG/2ML IJ SOLN
4.0000 mg | Freq: Once | INTRAMUSCULAR | Status: AC
  Administered 2022-10-14: 4 mg via INTRAVENOUS
  Filled 2022-10-14: qty 2

## 2022-10-14 MED ORDER — SODIUM CHLORIDE 0.9 % IV BOLUS
1000.0000 mL | Freq: Once | INTRAVENOUS | Status: AC
  Administered 2022-10-14: 1000 mL via INTRAVENOUS

## 2022-10-14 MED ORDER — BACITRACIN ZINC 500 UNIT/GM EX OINT: 1.0000 | TOPICAL_OINTMENT | Freq: Two times a day (BID) | CUTANEOUS | 0 refills | Status: DC

## 2022-10-14 MED ORDER — FAMOTIDINE IN NACL 20-0.9 MG/50ML-% IV SOLN
20.0000 mg | Freq: Once | INTRAVENOUS | Status: AC
  Administered 2022-10-14: 20 mg via INTRAVENOUS
  Filled 2022-10-14: qty 50

## 2022-10-14 MED ORDER — POTASSIUM CHLORIDE CRYS ER 20 MEQ PO TBCR
40.0000 meq | EXTENDED_RELEASE_TABLET | Freq: Once | ORAL | Status: AC
  Administered 2022-10-14: 40 meq via ORAL
  Filled 2022-10-14: qty 2

## 2022-10-14 MED ORDER — CEPHALEXIN 500 MG PO CAPS: 500.0000 mg | ORAL_CAPSULE | Freq: Three times a day (TID) | ORAL | 0 refills | Status: DC

## 2022-10-14 MED ORDER — BACITRACIN ZINC 500 UNIT/GM EX OINT: 1.0000 | TOPICAL_OINTMENT | Freq: Two times a day (BID) | CUTANEOUS | 0 refills | Status: AC

## 2022-10-14 MED ORDER — CEPHALEXIN 500 MG PO CAPS: 500.0000 mg | ORAL_CAPSULE | Freq: Three times a day (TID) | ORAL | 0 refills | Status: AC

## 2022-10-14 NOTE — Discharge Instructions (Addendum)
Please follow up with your primary care doctor within 2-3 days. For constipation we also recommend a diet high in fiber (beans, fruits, vegetables, whole grains). Take Colace 100-200 mg up to three times per day. You may take along with Senokot 1-2 tabs, ingest with full glass of water.  You may also take MiraLAX 1-2 capfuls 1-2 times a day until stools become soft and then slowly decrease the amount of MiraLAX used.  Maintain fluid intake daily. Please increase fibers in your diet. You may also take Milk of Magnesia 30 mL as needed for constipation, you may repeat in 2 hours again if no bowl movement.  You have a rash on your thigh of unclear etiology, please apply topical ointment as prescribed to prevent possible infection.  Please follow-up with dermatology in the next week for recheck.  Please return to the emergency department if the symptoms become worse, you have rash to your mouth/nose/mucous membranes.  Develop fever, nausea and vomiting, any worsening or worrisome symptoms.  It was a pleasure caring for you today in the emergency department.  Please return to the emergency department for any worsening or worrisome symptoms.
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
## 2022-10-14 NOTE — ED Triage Notes (Signed)
Pt coming in today complaining dysuria, cloudy, and foul smelling urine. Pt endorses nausea after eating. Pt also concerned about a blister that has formed on his right thigh, has not had any trauma to that area.

## 2022-10-15 LAB — URINE CULTURE

## 2022-10-16 LAB — URINE CULTURE: Culture: >=100000 — AB

## 2022-10-17 ENCOUNTER — Telehealth (HOSPITAL_BASED_OUTPATIENT_CLINIC_OR_DEPARTMENT_OTHER): Payer: Self-pay | Admitting: *Deleted

## 2022-10-17 NOTE — Telephone Encounter (Signed)
Post ED Visit - Positive Culture Follow-up  Culture report reviewed by antimicrobial stewardship pharmacist: [facility] []  Enzo Bi, Pharm.D. []  Celedonio Miyamoto, Pharm.D., BCPS AQ-ID []  Garvin Fila, Pharm.D., BCPS []  Georgina Pillion, [address].D., BCPS []  [place], [address].D., BCPS, AAHIVP []  Estella Husk, Pharm.D., BCPS, AAHIVP []  Lysle Pearl, PharmD, BCPS []  Phillips Climes, PharmD, BCPS []  Agapito Games, PharmD, BCPS []  Verlan Friends, PharmD []  Mervyn Gay, PharmD, BCPS []  Vinnie Level, PharmD  [facility] []  Len Childs, PharmD []  Greer Pickerel, PharmD []  Adalberto Cole, PharmD []  Perlie Gold, Rph []  Lonell Face) Jean Rosenthal, PharmD []  Earl Many, PharmD []  Junita Push, PharmD []  Dorna Leitz, PharmD []  Terrilee Files, PharmD []  Lynann Beaver, PharmD [x]  Keturah Barre, PharmD []  Loralee Pacas, PharmD []  Bernadene Person, PharmD   Positive urine culture Treated with Cephalexin, organism sensitive to the same and no further patient follow-up is required at this time.  Patsey Berthold 10/17/2022, 12:27 PM

## 2022-10-19 ENCOUNTER — Telehealth: Payer: Self-pay

## 2022-10-19 NOTE — Telephone Encounter (Signed)
     Patient  visit on 10/14/2022  at [facility] was for blister, dysuria.  Have you been able to follow up with your primary care physician? Yes  The patient was or was not able to obtain any needed medicine or equipment. Patient was able to obtain medication.  Are there diet recommendations that you are having difficulty following? No  Patient expresses understanding of discharge instructions and education provided has no other needs at this time. Yes   Carl Vang [facility]  [facility] Population Health Community Resource Care Guide   ??[email or web address]  ?? [phone]   [email or web address]: [email or web address]  [email or web address]

## 2022-10-20 ENCOUNTER — Ambulatory Visit: Payer: 59 | Attending: Nurse Practitioner | Admitting: Nurse Practitioner

## 2022-10-20 ENCOUNTER — Encounter: Payer: Self-pay | Admitting: Nurse Practitioner

## 2022-10-20 VITALS — BP 121/69 | HR 83 | Ht 65.0 in | Wt 170.6 lb

## 2022-10-20 DIAGNOSIS — Z09 Encounter for follow-up examination after completed treatment for conditions other than malignant neoplasm: Secondary | ICD-10-CM | POA: Diagnosis not present
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
## 2022-10-23 DIAGNOSIS — R269 Unspecified abnormalities of gait and mobility: Secondary | ICD-10-CM | POA: Diagnosis not present

## 2022-10-23 DIAGNOSIS — S88112S Complete traumatic amputation at level between knee and ankle, left lower leg, sequela: Secondary | ICD-10-CM | POA: Diagnosis not present

## 2022-11-09 ENCOUNTER — Emergency Department (HOSPITAL_COMMUNITY): Payer: 59

## 2022-11-09 ENCOUNTER — Other Ambulatory Visit: Payer: Self-pay

## 2022-11-09 ENCOUNTER — Inpatient Hospital Stay (HOSPITAL_COMMUNITY)
Admission: EM | Admit: 2022-11-09 | Discharge: 2022-11-11 | DRG: 690 | Disposition: A | Discharge status: Home / Self Care | Payer: 59 | Attending: Internal Medicine | Admitting: Internal Medicine

## 2022-11-09 DIAGNOSIS — N400 Enlarged prostate without lower urinary tract symptoms: Secondary | ICD-10-CM | POA: Diagnosis present

## 2022-11-09 DIAGNOSIS — Z87440 Personal history of urinary (tract) infections: Secondary | ICD-10-CM

## 2022-11-09 DIAGNOSIS — E861 Hypovolemia: Secondary | ICD-10-CM | POA: Diagnosis not present

## 2022-11-09 DIAGNOSIS — Z8249 Family history of ischemic heart disease and other diseases of the circulatory system: Secondary | ICD-10-CM | POA: Diagnosis not present

## 2022-11-09 DIAGNOSIS — R531 Weakness: Secondary | ICD-10-CM | POA: Diagnosis not present

## 2022-11-09 DIAGNOSIS — I10 Essential (primary) hypertension: Secondary | ICD-10-CM | POA: Diagnosis present

## 2022-11-09 DIAGNOSIS — Z7982 Long term (current) use of aspirin: Secondary | ICD-10-CM

## 2022-11-09 DIAGNOSIS — R300 Dysuria: Secondary | ICD-10-CM | POA: Diagnosis not present

## 2022-11-09 DIAGNOSIS — R7303 Prediabetes: Secondary | ICD-10-CM | POA: Diagnosis present

## 2022-11-09 DIAGNOSIS — Z96651 Presence of right artificial knee joint: Secondary | ICD-10-CM | POA: Diagnosis present

## 2022-11-09 DIAGNOSIS — E785 Hyperlipidemia, unspecified: Secondary | ICD-10-CM | POA: Diagnosis not present

## 2022-11-09 DIAGNOSIS — N390 Urinary tract infection, site not specified: Principal | ICD-10-CM | POA: Diagnosis present

## 2022-11-09 DIAGNOSIS — Z96641 Presence of right artificial hip joint: Secondary | ICD-10-CM | POA: Diagnosis present

## 2022-11-09 DIAGNOSIS — E876 Hypokalemia: Secondary | ICD-10-CM | POA: Diagnosis present

## 2022-11-09 DIAGNOSIS — R509 Fever, unspecified: Secondary | ICD-10-CM

## 2022-11-09 DIAGNOSIS — I700 Atherosclerosis of aorta: Secondary | ICD-10-CM | POA: Diagnosis not present

## 2022-11-09 DIAGNOSIS — E860 Dehydration: Secondary | ICD-10-CM | POA: Diagnosis not present

## 2022-11-09 DIAGNOSIS — Z79891 Long term (current) use of opiate analgesic: Secondary | ICD-10-CM

## 2022-11-09 DIAGNOSIS — Z79899 Other long term (current) drug therapy: Secondary | ICD-10-CM | POA: Diagnosis not present

## 2022-11-09 DIAGNOSIS — A419 Sepsis, unspecified organism: Secondary | ICD-10-CM | POA: Diagnosis not present

## 2022-11-09 DIAGNOSIS — Z89512 Acquired absence of left leg below knee: Secondary | ICD-10-CM

## 2022-11-09 DIAGNOSIS — Z833 Family history of diabetes mellitus: Secondary | ICD-10-CM

## 2022-11-09 DIAGNOSIS — R000 Tachycardia, unspecified: Secondary | ICD-10-CM | POA: Diagnosis not present

## 2022-11-09 DIAGNOSIS — Z791 Long term (current) use of non-steroidal anti-inflammatories (NSAID): Secondary | ICD-10-CM | POA: Diagnosis not present

## 2022-11-09 DIAGNOSIS — R112 Nausea with vomiting, unspecified: Secondary | ICD-10-CM | POA: Diagnosis not present

## 2022-11-09 DIAGNOSIS — E782 Mixed hyperlipidemia: Secondary | ICD-10-CM | POA: Diagnosis not present

## 2022-11-09 DIAGNOSIS — B964 Proteus (mirabilis) (morganii) as the cause of diseases classified elsewhere: Secondary | ICD-10-CM | POA: Diagnosis not present

## 2022-11-09 DIAGNOSIS — N3000 Acute cystitis without hematuria: Secondary | ICD-10-CM | POA: Diagnosis not present

## 2022-11-09 DIAGNOSIS — Z1629 Resistance to other single specified antibiotic: Secondary | ICD-10-CM | POA: Diagnosis not present

## 2022-11-09 DIAGNOSIS — E871 Hypo-osmolality and hyponatremia: Secondary | ICD-10-CM | POA: Diagnosis not present

## 2022-11-09 DIAGNOSIS — R109 Unspecified abdominal pain: Secondary | ICD-10-CM | POA: Diagnosis not present

## 2022-11-09 LAB — COMPREHENSIVE METABOLIC PANEL
ALT: 28 U/L (ref 0–44)
AST: 28 U/L (ref 15–41)
Albumin: 3.8 g/dL (ref 3.5–5.0)
Alkaline Phosphatase: 66 U/L (ref 38–126)
Anion gap: 13 (ref 5–15)
BUN: 26 mg/dL — ABNORMAL HIGH (ref 8–23)
CO2: 24 mmol/L (ref 22–32)
Calcium: 8.7 mg/dL — ABNORMAL LOW (ref 8.9–10.3)
Chloride: 92 mmol/L — ABNORMAL LOW (ref 98–111)
Creatinine, Ser: 1.15 mg/dL (ref 0.61–1.24)
GFR, Estimated: >60 mL/min (ref >60)
Glucose, Bld: 112 mg/dL — ABNORMAL HIGH (ref 70–99)
Potassium: 3 mmol/L — ABNORMAL LOW (ref 3.5–5.1)
Sodium: 129 mmol/L — ABNORMAL LOW (ref 135–145)
Total Bilirubin: 1.1 mg/dL (ref 0.3–1.2)
Total Protein: 8.2 g/dL — ABNORMAL HIGH (ref 6.5–8.1)

## 2022-11-09 LAB — URINALYSIS, W/ REFLEX TO CULTURE (INFECTION SUSPECTED)
Bilirubin Urine: NEGATIVE
Glucose, UA: NEGATIVE mg/dL
Hgb urine dipstick: NEGATIVE
Ketones, ur: 20 mg/dL — AB
Nitrite: POSITIVE — AB
Protein, ur: 30 mg/dL — AB
Specific Gravity, Urine: 1.019 (ref 1.005–1.030)
WBC, UA: >50 WBC/hpf (ref 0–5)
pH: 8 (ref 5.0–8.0)

## 2022-11-09 LAB — CBC WITH DIFFERENTIAL/PLATELET
Abs Immature Granulocytes: 0.06 10*3/uL (ref 0.00–0.07)
Basophils Absolute: 0 10*3/uL (ref 0.0–0.1)
Basophils Relative: 0 %
Eosinophils Absolute: 0 10*3/uL (ref 0.0–0.5)
Eosinophils Relative: 0 %
HCT: 39.9 % (ref 39.0–52.0)
Hemoglobin: 13.2 g/dL (ref 13.0–17.0)
Immature Granulocytes: 0 %
Lymphocytes Relative: 11 %
Lymphs Abs: 1.6 10*3/uL (ref 0.7–4.0)
MCH: 27.3 pg (ref 26.0–34.0)
MCHC: 33.1 g/dL (ref 30.0–36.0)
MCV: 82.4 fL (ref 80.0–100.0)
Monocytes Absolute: 1.2 10*3/uL — ABNORMAL HIGH (ref 0.1–1.0)
Monocytes Relative: 8 %
Neutro Abs: 11.9 10*3/uL — ABNORMAL HIGH (ref 1.7–7.7)
Neutrophils Relative %: 81 %
Platelets: 246 10*3/uL (ref 150–400)
RBC: 4.84 MIL/uL (ref 4.22–5.81)
RDW: 13.6 % (ref 11.5–15.5)
WBC: 14.8 10*3/uL — ABNORMAL HIGH (ref 4.0–10.5)
nRBC: 0 % (ref 0.0–0.2)

## 2022-11-09 MED ORDER — POTASSIUM CHLORIDE CRYS ER 20 MEQ PO TBCR
40.0000 meq | EXTENDED_RELEASE_TABLET | Freq: Once | ORAL | Status: AC
  Administered 2022-11-09: 40 meq via ORAL
  Filled 2022-11-09: qty 2

## 2022-11-09 MED ORDER — SODIUM CHLORIDE 0.9 % IV BOLUS
1000.0000 mL | Freq: Once | INTRAVENOUS | Status: AC
  Administered 2022-11-09: 1000 mL via INTRAVENOUS

## 2022-11-09 MED ORDER — SODIUM CHLORIDE 0.9 % IV BOLUS
500.0000 mL | Freq: Once | INTRAVENOUS | Status: AC
  Administered 2022-11-09: 500 mL via INTRAVENOUS

## 2022-11-09 MED ORDER — ACETAMINOPHEN 500 MG PO TABS
1000.0000 mg | ORAL_TABLET | Freq: Once | ORAL | Status: AC
  Administered 2022-11-09: 1000 mg via ORAL
  Filled 2022-11-09: qty 2

## 2022-11-09 MED ORDER — SODIUM CHLORIDE 0.9 % IV SOLN
1.0000 g | Freq: Once | INTRAVENOUS | Status: AC
  Administered 2022-11-09: 1 g via INTRAVENOUS
  Filled 2022-11-09: qty 10

## 2022-11-09 NOTE — ED Triage Notes (Signed)
Pt BIBA from home.Pt c/o possible UTI. Concerned they were they were dc'd w/o abx. C/o N/V and urine retention.  AOx4

## 2022-11-09 NOTE — ED Provider Notes (Signed)
[place] EMERGENCY DEPARTMENT AT [facility] Provider Note   CSN: [id number] Arrival date & time: 11/09/22  2059     History  Chief Complaint  Patient presents with   UTI    Carl Vang is a 62 y.o. male.  62 year old male with prior medical history as detailed below presents for evaluation.  Patient complains of nausea, vomiting, lower abdominal discomfort, and difficulty urinating x 3 to 4 days.  Patient reports prior history of UTI last month.  Patient is unclear when asked whether he completed course of antibiotics as previously prescribed.  Patient reports subjective fever.  Patient reports that when he tries to urinate he has minimal output x 24 hours.  He admits to decreased p.o. intake over the last 48 hours.  His last emesis was earlier this morning.  The history is provided by the patient and medical records.       Home Medications Prior to Admission medications   Medication Sig Start Date End Date Taking? Authorizing Provider  acetaminophen-codeine (TYLENOL #3) 300-30 MG tablet Take 1 tablet by mouth every 12 (twelve) hours as needed for moderate pain. 07/15/22   Hoy Register, MD  amLODipine (NORVASC) 10 MG tablet Take 1 tablet (10 mg total) by mouth daily. 07/15/22   Hoy Register, MD  aspirin EC 325 MG tablet Take 1 tablet (325 mg total) by mouth 2 (two) times daily after a meal. 09/17/22   Shanon Payor, PA-C  atorvastatin (LIPITOR) 20 MG tablet Take 1 tablet (20 mg total) by mouth every evening. 07/15/22 01/11/23  Hoy Register, MD  celecoxib (CELEBREX) 200 MG capsule Take 1 capsule (200 mg total) by mouth 2 (two) times daily. 09/17/22 09/17/23  Shanon Payor, PA-C  chlorthalidone (HYGROTON) 25 MG tablet Take 1 tablet (25 mg total) by mouth daily. 07/15/22   Hoy Register, MD  docusate sodium (COLACE) 100 MG capsule Take 1 capsule (100 mg total) by mouth 2 (two) times daily as needed (for constipation). 09/17/22 09/17/23  Shanon Payor, PA-C   MEDIQUE ASPIRIN 325 MG tablet Take 325 mg by mouth 2 (two) times daily. 09/17/22   [provider]  meloxicam (MOBIC) 15 MG tablet Take 15 mg by mouth daily. 10/15/22   [provider]  oxyCODONE (OXY IR/ROXICODONE) 5 MG immediate release tablet Take by mouth. 05/05/22   [provider]  oxyCODONE (ROXICODONE) 5 MG immediate release tablet Take 1 tablet (5 mg total) by mouth every 4 (four) hours as needed for severe pain. 09/17/22   Shanon Payor, PA-C  phenazopyridine (PYRIDIUM) 100 MG tablet Take 2 tablets (200 mg total) by mouth 3 (three) times daily as needed for urinary discomfort. 10/13/22     tiZANidine (ZANAFLEX) 4 MG tablet Take 1 tablet (4 mg total) by mouth 3 (three) times daily as needed for muscle spasms. 09/17/22   Shanon Payor, PA-C  traMADol (ULTRAM) 50 MG tablet TAKE 1/2 TO 1 TABLET BY MOUTH 3 TIMES A DAY AS NEEDED FOR PAIN 06/05/22   [provider]      Allergies    Patient has no known allergies.    Review of Systems   Review of Systems  All other systems reviewed and are negative.   Physical Exam Updated Vital Signs BP 129/76   Pulse (!) 106   Temp 99.9 F (37.7 C) (Oral)   Resp 17   Ht 5\' 5"  (1.651 m)   Wt 77 kg   SpO2 95%  BMI 28.25 kg/m  Physical Exam Vitals and nursing note reviewed.  Constitutional:      General: He is not in acute distress.    Appearance: Normal appearance. He is well-developed.  HENT:     Head: Normocephalic and atraumatic.  Eyes:     Conjunctiva/sclera: Conjunctivae normal.     Pupils: Pupils are equal, round, and reactive to light.  Cardiovascular:     Rate and Rhythm: Normal rate and regular rhythm.     Heart sounds: Normal heart sounds.  Pulmonary:     Effort: Pulmonary effort is normal. No respiratory distress.     Breath sounds: Normal breath sounds.  Abdominal:     General: There is no distension.     Palpations: Abdomen is soft.     Tenderness: There is no abdominal tenderness.   Musculoskeletal:        General: No deformity.     Cervical back: Normal range of motion and neck supple.     Comments: Left BKA  Skin:    General: Skin is warm and dry.  Neurological:     General: No focal deficit present.     Mental Status: He is alert and oriented to person, place, and time. Mental status is at baseline.     ED Results / Procedures / Treatments   Labs (all labs ordered are listed, but only abnormal results are displayed) Labs Reviewed  CBC WITH DIFFERENTIAL/PLATELET - Abnormal; Notable for the following components:      Result Value   WBC 14.8 (*)    Neutro Abs 11.9 (*)    Monocytes Absolute 1.2 (*)    All other components within normal limits  COMPREHENSIVE METABOLIC PANEL - Abnormal; Notable for the following components:   Sodium 129 (*)    Potassium 3.0 (*)    Chloride 92 (*)    Glucose, Bld 112 (*)    BUN 26 (*)    Calcium 8.7 (*)    Total Protein 8.2 (*)    All other components within normal limits  URINE CULTURE  URINALYSIS, W/ REFLEX TO CULTURE (INFECTION SUSPECTED)    EKG EKG Interpretation  Date/Time:  Monday Nov 09 2022 21:44:55 EDT Ventricular Rate:  105 PR Interval:  159 QRS Duration: 78 QT Interval:  317 QTC Calculation: 419 R Axis:   -1 Text Interpretation: Sinus tachycardia Anterior infarct, old Confirmed by Kristine Royal [phone]) on 11/09/2022 9:52:06 PM  Radiology CT ABDOMEN PELVIS WO CONTRAST  Result Date: 11/09/2022 CLINICAL DATA:  Abdominal pain, acute, nonlocalized, urinary tract infection, nausea/vomiting EXAM: CT ABDOMEN AND PELVIS WITHOUT CONTRAST TECHNIQUE: Multidetector CT imaging of the abdomen and pelvis was performed following the standard protocol without IV contrast. RADIATION DOSE REDUCTION: This exam was performed according to the departmental dose-optimization program which includes automated exposure control, adjustment of the mA and/or kV according to patient size and/or use of iterative reconstruction technique.  COMPARISON:  10/14/2022 FINDINGS: Lower chest: Bibasilar atelectasis. Cardiac size within normal limits. Gynecomastia. No acute abnormality. Hepatobiliary: Cholelithiasis without pericholecystic inflammatory change. Liver unremarkable. No intra or extrahepatic biliary ductal dilation. Pancreas: Unremarkable Spleen: Unremarkable Adrenals/Urinary Tract: The adrenal glands are unremarkable. The kidneys are normal on this noncontrast examination. Streak artifact slightly limits evaluation of the bladder, however, the visualized portion is unremarkable. Stomach/Bowel: Stomach is within normal limits. Appendix appears normal. No evidence of bowel wall thickening, distention, or inflammatory changes. Vascular/Lymphatic: Aortic atherosclerosis. No enlarged abdominal or pelvic lymph nodes. Reproductive: Mild prostatic hypertrophy Other: No free  air.  No abdominopelvic ascites. Musculoskeletal: Asymmetric soft tissue infiltration within the subcutaneous fat of the right hip laterally is likely postsurgical in nature and stable since prior examination. Surgical changes of right total hip arthroplasty are identified. Degenerative changes are seen within the lumbar spine. No acute bone abnormality. No lytic or blastic bone lesion. IMPRESSION: 1. No acute intra-abdominal pathology identified. No definite radiographic explanation for the patient's reported symptoms. 2. Cholelithiasis. 3. Aortic atherosclerosis. Aortic Atherosclerosis (ICD10-I70.0). Electronically Signed   By: Helyn Numbers M.D.   On: 11/09/2022 22:51    Procedures Procedures    Medications Ordered in ED Medications  potassium chloride SA (KLOR-CON M) CR tablet 40 mEq (has no administration in time range)  cefTRIAXone (ROCEPHIN) 1 g in sodium chloride 0.9 % 100 mL IVPB (has no administration in time range)  sodium chloride 0.9 % bolus 500 mL (500 mLs Intravenous New Bag/Given 11/09/22 2146)  sodium chloride 0.9 % bolus 1,000 mL (1,000 mLs Intravenous New  Bag/Given 11/09/22 2243)    ED Course/ Medical Decision Making/ A&P                             Medical Decision Making Amount and/or Complexity of Data Reviewed Labs: ordered. Radiology: ordered.  Risk Prescription drug management.    Medical Screen Complete  This patient presented to the ED with complaint of nausea, weakness dysuria.  This complaint involves an extensive number of treatment options. The initial differential diagnosis includes, but is not limited to, dehydration, UTI, metabolic abnormality, etc.  This presentation is: Acute, Self-Limited, Previously Undiagnosed, Uncertain Prognosis, Complicated, Systemic Symptoms, and Threat to Life/Bodily Function  Patient is presenting with complaint of approximately 48 hours of nausea, intermittent vomiting, malaise, fatigue, dysuria, subjective fever.  Patient is concerned about possible UTI.  Patient was diagnosed with UTI last month.  It is unclear with repeated questioning of the patient whether he completed course of antibiotics as prescribed.  Patient with low-grade temp on arrival of 99.9, tachycardia. Initial white count is 14.8 with a sodium of 129, potassium 3.0, BUN 26, creatinine 1.1.  Patient appears to be mildly dehydrated with exam and labs.  Obtained urine is suggestive of UTI.  Patient given IV fluids and IV antibiotics for treatment.  CT abdomen pelvis is without acute abnormality.  Patient would benefit from admission for IV fluids, IV antibiotics.  Hospitalist service made aware of case and will evaluate for admission.   Additional history obtained:  External records from outside sources obtained and reviewed including prior ED visits and prior Inpatient records.    Lab Tests:  I ordered and personally interpreted labs.  The pertinent results include: CBC, UA, CMP, urine culture   Imaging Studies ordered:  I ordered imaging studies including CT abdomen pelvis I independently visualized  and interpreted obtained imaging which showed NAD I agree with the radiologist interpretation.   Cardiac Monitoring:  The patient was maintained on a cardiac monitor.  I personally viewed and interpreted the cardiac monitor which showed an underlying rhythm of: Sinus tach   Medicines ordered:  I ordered medication including IV fluids, Rocephin for UTI Reevaluation of the patient after these medicines showed that the patient: improved   Problem List / ED Course:  UTI, hyponatremia, hypokalemia, leukocytosis   Reevaluation:  After the interventions noted above, I reevaluated the patient and found that they have: improved  Disposition:  After consideration of the diagnostic results and the  patients response to treatment, I feel that the patent would benefit from admission.          Final Clinical Impression(s) / ED Diagnoses Final diagnoses:  Urinary tract infection without hematuria, site unspecified  Hyponatremia  Hypokalemia    Rx / DC Orders ED Discharge Orders     None         Wynetta Fines, MD 11/09/22 2341

## 2022-11-10 ENCOUNTER — Inpatient Hospital Stay (HOSPITAL_COMMUNITY): Payer: 59

## 2022-11-10 ENCOUNTER — Encounter (HOSPITAL_COMMUNITY): Payer: Self-pay | Admitting: Internal Medicine

## 2022-11-10 DIAGNOSIS — I10 Essential (primary) hypertension: Secondary | ICD-10-CM

## 2022-11-10 DIAGNOSIS — E861 Hypovolemia: Secondary | ICD-10-CM | POA: Diagnosis present

## 2022-11-10 DIAGNOSIS — E860 Dehydration: Secondary | ICD-10-CM | POA: Diagnosis present

## 2022-11-10 DIAGNOSIS — A419 Sepsis, unspecified organism: Secondary | ICD-10-CM | POA: Diagnosis present

## 2022-11-10 DIAGNOSIS — Z7982 Long term (current) use of aspirin: Secondary | ICD-10-CM | POA: Diagnosis not present

## 2022-11-10 DIAGNOSIS — Z79899 Other long term (current) drug therapy: Secondary | ICD-10-CM | POA: Diagnosis not present

## 2022-11-10 DIAGNOSIS — Z96651 Presence of right artificial knee joint: Secondary | ICD-10-CM | POA: Diagnosis present

## 2022-11-10 DIAGNOSIS — B964 Proteus (mirabilis) (morganii) as the cause of diseases classified elsewhere: Secondary | ICD-10-CM | POA: Diagnosis present

## 2022-11-10 DIAGNOSIS — N400 Enlarged prostate without lower urinary tract symptoms: Secondary | ICD-10-CM | POA: Diagnosis present

## 2022-11-10 DIAGNOSIS — R531 Weakness: Secondary | ICD-10-CM

## 2022-11-10 DIAGNOSIS — E871 Hypo-osmolality and hyponatremia: Secondary | ICD-10-CM | POA: Diagnosis not present

## 2022-11-10 DIAGNOSIS — Z833 Family history of diabetes mellitus: Secondary | ICD-10-CM | POA: Diagnosis not present

## 2022-11-10 DIAGNOSIS — E876 Hypokalemia: Secondary | ICD-10-CM | POA: Diagnosis present

## 2022-11-10 DIAGNOSIS — Z79891 Long term (current) use of opiate analgesic: Secondary | ICD-10-CM | POA: Diagnosis not present

## 2022-11-10 DIAGNOSIS — N390 Urinary tract infection, site not specified: Secondary | ICD-10-CM | POA: Diagnosis not present

## 2022-11-10 DIAGNOSIS — R7303 Prediabetes: Secondary | ICD-10-CM | POA: Diagnosis present

## 2022-11-10 DIAGNOSIS — E782 Mixed hyperlipidemia: Secondary | ICD-10-CM | POA: Diagnosis not present

## 2022-11-10 DIAGNOSIS — Z96641 Presence of right artificial hip joint: Secondary | ICD-10-CM | POA: Diagnosis present

## 2022-11-10 DIAGNOSIS — Z89512 Acquired absence of left leg below knee: Secondary | ICD-10-CM | POA: Diagnosis not present

## 2022-11-10 DIAGNOSIS — N3000 Acute cystitis without hematuria: Secondary | ICD-10-CM | POA: Diagnosis not present

## 2022-11-10 DIAGNOSIS — Z1629 Resistance to other single specified antibiotic: Secondary | ICD-10-CM | POA: Diagnosis present

## 2022-11-10 DIAGNOSIS — Z8249 Family history of ischemic heart disease and other diseases of the circulatory system: Secondary | ICD-10-CM | POA: Diagnosis not present

## 2022-11-10 DIAGNOSIS — Z87440 Personal history of urinary (tract) infections: Secondary | ICD-10-CM | POA: Diagnosis not present

## 2022-11-10 DIAGNOSIS — Z791 Long term (current) use of non-steroidal anti-inflammatories (NSAID): Secondary | ICD-10-CM | POA: Diagnosis not present

## 2022-11-10 DIAGNOSIS — E785 Hyperlipidemia, unspecified: Secondary | ICD-10-CM | POA: Diagnosis present

## 2022-11-10 DIAGNOSIS — R509 Fever, unspecified: Secondary | ICD-10-CM | POA: Diagnosis not present

## 2022-11-10 LAB — CBC WITH DIFFERENTIAL/PLATELET
Abs Immature Granulocytes: 0.05 10*3/uL (ref 0.00–0.07)
Basophils Absolute: 0 10*3/uL (ref 0.0–0.1)
Basophils Relative: 0 %
Eosinophils Absolute: 0 10*3/uL (ref 0.0–0.5)
Eosinophils Relative: 0 %
HCT: 35.9 % — ABNORMAL LOW (ref 39.0–52.0)
Hemoglobin: 11.6 g/dL — ABNORMAL LOW (ref 13.0–17.0)
Immature Granulocytes: 0 %
Lymphocytes Relative: 18 %
Lymphs Abs: 2.3 10*3/uL (ref 0.7–4.0)
MCH: 26.9 pg (ref 26.0–34.0)
MCHC: 32.3 g/dL (ref 30.0–36.0)
MCV: 83.1 fL (ref 80.0–100.0)
Monocytes Absolute: 1.3 10*3/uL — ABNORMAL HIGH (ref 0.1–1.0)
Monocytes Relative: 11 %
Neutro Abs: 8.7 10*3/uL — ABNORMAL HIGH (ref 1.7–7.7)
Neutrophils Relative %: 71 %
Platelets: 227 10*3/uL (ref 150–400)
RBC: 4.32 MIL/uL (ref 4.22–5.81)
RDW: 13.5 % (ref 11.5–15.5)
WBC: 12.3 10*3/uL — ABNORMAL HIGH (ref 4.0–10.5)
nRBC: 0 % (ref 0.0–0.2)

## 2022-11-10 LAB — COMPREHENSIVE METABOLIC PANEL
ALT: 25 U/L (ref 0–44)
AST: 24 U/L (ref 15–41)
Albumin: 3.1 g/dL — ABNORMAL LOW (ref 3.5–5.0)
Alkaline Phosphatase: 52 U/L (ref 38–126)
Anion gap: 9 (ref 5–15)
BUN: 20 mg/dL (ref 8–23)
CO2: 27 mmol/L (ref 22–32)
Calcium: 8.1 mg/dL — ABNORMAL LOW (ref 8.9–10.3)
Chloride: 95 mmol/L — ABNORMAL LOW (ref 98–111)
Creatinine, Ser: 1.03 mg/dL (ref 0.61–1.24)
GFR, Estimated: >60 mL/min (ref >60)
Glucose, Bld: 112 mg/dL — ABNORMAL HIGH (ref 70–99)
Potassium: 3.3 mmol/L — ABNORMAL LOW (ref 3.5–5.1)
Sodium: 131 mmol/L — ABNORMAL LOW (ref 135–145)
Total Bilirubin: 0.7 mg/dL (ref 0.3–1.2)
Total Protein: 6.6 g/dL (ref 6.5–8.1)

## 2022-11-10 LAB — LACTIC ACID, PLASMA
Lactic Acid, Venous: 1 mmol/L (ref 0.5–1.9)
Lactic Acid, Venous: 1.1 mmol/L (ref 0.5–1.9)

## 2022-11-10 LAB — MAGNESIUM: Magnesium: 2 mg/dL (ref 1.7–2.4)

## 2022-11-10 LAB — CREATININE, URINE, RANDOM: Creatinine, Urine: 196 mg/dL

## 2022-11-10 LAB — OSMOLALITY: Osmolality: 285 mOsm/kg (ref 275–295)

## 2022-11-10 LAB — OSMOLALITY, URINE: Osmolality, Ur: 799 mOsm/kg (ref 300–900)

## 2022-11-10 LAB — SODIUM, URINE, RANDOM: Sodium, Ur: 43 mmol/L

## 2022-11-10 LAB — TSH: TSH: 1.354 u[IU]/mL (ref 0.350–4.500)

## 2022-11-10 MED ORDER — ONDANSETRON HCL 4 MG/2ML IJ SOLN: 4.0000 mg | Freq: Four times a day (QID) | INTRAMUSCULAR | Status: DC | PRN

## 2022-11-10 MED ORDER — OXYCODONE HCL 5 MG PO TABS
5.0000 mg | ORAL_TABLET | ORAL | Status: DC | PRN
  Administered 2022-11-10: 5 mg via ORAL
  Filled 2022-11-10: qty 1

## 2022-11-10 MED ORDER — ACETAMINOPHEN 325 MG PO TABS: 650.0000 mg | ORAL_TABLET | Freq: Four times a day (QID) | ORAL | Status: DC | PRN

## 2022-11-10 MED ORDER — LACTATED RINGERS IV SOLN: INTRAVENOUS | Status: DC

## 2022-11-10 MED ORDER — ATORVASTATIN CALCIUM 20 MG PO TABS
20.0000 mg | ORAL_TABLET | Freq: Every evening | ORAL | Status: DC
  Administered 2022-11-10: 20 mg via ORAL
  Filled 2022-11-10: qty 1

## 2022-11-10 MED ORDER — SODIUM CHLORIDE 0.9 % IV SOLN
1.0000 g | INTRAVENOUS | Status: DC
  Administered 2022-11-10: 1 g via INTRAVENOUS
  Filled 2022-11-10 (×3): qty 10

## 2022-11-10 MED ORDER — POTASSIUM CHLORIDE CRYS ER 20 MEQ PO TBCR
40.0000 meq | EXTENDED_RELEASE_TABLET | Freq: Once | ORAL | Status: AC
  Administered 2022-11-10: 40 meq via ORAL
  Filled 2022-11-10: qty 2

## 2022-11-10 MED ORDER — ACETAMINOPHEN 650 MG RE SUPP: 650.0000 mg | Freq: Four times a day (QID) | RECTAL | Status: DC | PRN

## 2022-11-10 MED ORDER — MELATONIN 3 MG PO TABS: 3.0000 mg | ORAL_TABLET | Freq: Every evening | ORAL | Status: DC | PRN
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
## 2022-11-10 NOTE — Progress Notes (Signed)
Patient seen and examined.  62 year old gentleman with history of hypertension, hyperlipidemia came to the emergency room with about 3 days of dysuria, abdominal pain and generalized weakness with subjective fever.  Diagnosed with Klebsiella UTI on 4/10, treated with Keflex.  In the emergency room temperature 100.6, blood pressure stable.  On room air.  He was found dehydrated, urine was abnormal.  CT scan abdomen pelvis showed no acute intra-abdominal or intrapelvic process.  Negative for hydronephrosis or urinary retention.  Acute UTI, present on admission and the patient with prostatism symptoms. Continue Rocephin.  CT scan did not show any evidence of complication. Check postvoid residual urine.  If significant retention, will discharge patient on Flomax. Replace electrolytes. Work with PT OT. Anticipate tomorrow.  Admitted by early morning hospitalist.  See H&P, assessment plan in addition to above.  Same-day admit.  No charge visit.

## 2022-11-10 NOTE — Plan of Care (Signed)
  Problem: Education: Goal: Knowledge of General Education information will improve Description: Including pain rating scale, medication(s)/side effects and non-pharmacologic comfort measures Outcome: Progressing   Problem: Clinical Measurements: Goal: Ability to maintain clinical measurements within normal limits will improve Outcome: Progressing Goal: Will remain free from infection Outcome: Progressing   Problem: Safety: Goal: Ability to remain free from injury will improve Outcome: Progressing   

## 2022-11-10 NOTE — Evaluation (Signed)
Physical Therapy Evaluation Patient Details Name: Carl Vang MRN: [id number] DOB: [birth year] Today's Date: 11/10/2022  History of Present Illness  Pt is 62 year old presented to [facility] on  11/10/22 for generalized weakness. Pt found to have sepsis due to UTI. PMH - lt BKA, HTN, THR, TKR  Clinical Impression  Pt at or very close to baseline mobility. From PT standpoint can return home when medically ready. Will ask mobility team to follow while remains inpatient.        Recommendations for follow up therapy are one component of a multi-disciplinary discharge planning process, led by the attending physician.  Recommendations may be updated based on patient status, additional functional criteria and insurance authorization.  Follow Up Recommendations       Assistance Recommended at Discharge PRN  Patient can return home with the following       Equipment Recommendations None recommended by PT  Recommendations for Other Services       Functional Status Assessment Patient has not had a recent decline in their functional status     Precautions / Restrictions Precautions Precautions: None Restrictions Weight Bearing Restrictions: No      Mobility  Bed Mobility Overal bed mobility: Modified Independent                  Transfers Overall transfer level: Needs assistance Equipment used: Rolling walker (2 wheels) Transfers: Sit to/from Stand Sit to Stand: Supervision                Ambulation/Gait Ambulation/Gait assistance: Supervision Gait Distance (Feet): 170 Feet Assistive device: Rolling walker (2 wheels) Gait Pattern/deviations: Step-through pattern, Decreased stride length Gait velocity: decr Gait velocity interpretation: 1.31 - 2.62 ft/sec, indicative of limited community ambulator   General Gait Details: Steady gait with rolling walker  Stairs            Wheelchair Mobility    Modified Rankin (Stroke Patients Only)       Balance Overall  balance assessment: Mild deficits observed, not formally tested                                           Pertinent Vitals/Pain Pain Assessment Pain Assessment: No/denies pain    Home Living Family/patient expects to be discharged to:: Private residence Living Arrangements: Other relatives (sister) Available Help at Discharge: Family;Available PRN/intermittently Type of Home: House Home Access: Stairs to enter Entrance Stairs-Rails: Right Entrance Stairs-Number of Steps: 2   Home Layout: One level Home Equipment: Agricultural consultant (2 wheels);Cane - single point      Prior Function Prior Level of Function : Independent/Modified Independent             Mobility Comments: Uses rolling walker       Hand Dominance        Extremity/Trunk Assessment   Upper Extremity Assessment Upper Extremity Assessment: Defer to OT evaluation    Lower Extremity Assessment Lower Extremity Assessment: LLE deficits/detail LLE Deficits / Details: BKA       Communication   Communication: HOH  Cognition Arousal/Alertness: Awake/alert Behavior During Therapy: WFL for tasks assessed/performed Overall Cognitive Status: Within Functional Limits for tasks assessed  General Comments      Exercises     Assessment/Plan    PT Assessment Patient does not need any further PT services  PT Problem List         PT Treatment Interventions      PT Goals (Current goals can be found in the Care Plan section)  Acute Rehab PT Goals PT Goal Formulation: All assessment and education complete, DC therapy    Frequency       Co-evaluation               AM-PAC PT "6 Clicks" Mobility  Outcome Measure Help needed turning from your back to your side while in a flat bed without using bedrails?: None Help needed moving from lying on your back to sitting on the side of a flat bed without using bedrails?: None Help  needed moving to and from a bed to a chair (including a wheelchair)?: A Little Help needed standing up from a chair using your arms (e.g., wheelchair or bedside chair)?: A Little Help needed to walk in hospital room?: A Little Help needed climbing 3-5 steps with a railing? : A Little 6 Click Score: 20    End of Session Equipment Utilized During Treatment: Gait belt Activity Tolerance: Patient tolerated treatment well Patient left: in chair;with call bell/phone within reach Nurse Communication: Mobility status PT Visit Diagnosis: Other abnormalities of gait and mobility (R26.89)    Time: 1610-9604 PT Time Calculation (min) (ACUTE ONLY): 15 min   Charges:   PT Evaluation $PT Eval Low Complexity: 1 Low          [facility] PT Acute Rehabilitation Services Office [phone]   Angelina Ok [facility] 11/10/2022, 11:17 AM

## 2022-11-10 NOTE — TOC Initial Note (Signed)
Transition of Care [facility]) - Initial/Assessment Note    Patient Details  Name: Carl Vang MRN: [id number] Date of Birth: 1960-07-15  Transition of Care [facility]) CM/SW Contact:    Lanier Clam, RN Phone Number: 11/10/2022, 10:11 AM  Clinical Narrative: From home used [employer] rep Tresa Endo following in past will use again if recc.Await PT eval.                  Expected Discharge Plan: Home w Home Health Services Barriers to Discharge: Continued Medical Work up   Patient Goals and CMS Choice Patient states their goals for this hospitalization and ongoing recovery are:: Home          Expected Discharge Plan and Services   Discharge Planning Services: CM Consult   Living arrangements for the past 2 months: Single Family Home                                      Prior Living Arrangements/Services Living arrangements for the past 2 months: Single Family Home Lives with:: Siblings Patient language and need for interpreter reviewed:: Yes Do you feel safe going back to the place where you live?: Yes      Need for Family Participation in Patient Care: Yes (Comment) Care giver support system in place?: Yes (comment) Current home services: DME (rw,3n1,showerchair;[employer] in past will use again if recc. for HHPT.) Criminal Activity/Legal Involvement Pertinent to Current Situation/Hospitalization: No - Comment as needed  Activities of Daily Living Home Assistive Devices/Equipment: Walker (specify type), Cane (specify quad or straight) ADL Screening (condition at time of admission) Patient's cognitive ability adequate to safely complete daily activities?: Yes Is the patient deaf or have difficulty hearing?: Yes Does the patient have difficulty seeing, even when wearing glasses/contacts?: No Does the patient have difficulty concentrating, remembering, or making decisions?: No Patient able to express need for assistance with ADLs?: Yes Does the patient have difficulty  dressing or bathing?: No Independently performs ADLs?: No Communication: Independent Dressing (OT): Independent with device (comment) Grooming: Independent with device (comment) Feeding: Independent with device (comment) Bathing: Independent with device (comment) Toileting: Independent with device (comment) In/Out Bed: Independent with device (comment) Walks in Home: Independent with device (comment) Does the patient have difficulty walking or climbing stairs?: No Weakness of Legs: Both Weakness of Arms/Hands: None  Permission Sought/Granted Permission sought to share information with : Case Manager Permission granted to share information with : Yes, Verbal Permission Granted  Share Information with NAME: Case manager           Emotional Assessment Appearance:: Appears stated age Attitude/Demeanor/Rapport: Gracious Affect (typically observed): Accepting Orientation: : Oriented to Self, Oriented to Place, Oriented to  Time, Oriented to Situation Alcohol / Substance Use: Not Applicable Psych Involvement: No (comment)  Admission diagnosis:  Hypokalemia [E87.6] Hyponatremia [E87.1] UTI (urinary tract infection) [N39.0] Fever, unspecified fever cause [R50.9] Urinary tract infection without hematuria, site unspecified [N39.0] Patient Active Problem List   Diagnosis Date Noted   UTI (urinary tract infection) 11/10/2022   Sepsis (HCC) 11/10/2022   Generalized weakness 11/10/2022   Dehydration 11/10/2022   Hypokalemia 11/10/2022   Acute hyponatremia 11/10/2022   Status post total hip replacement, right 09/14/2022   Primary osteoarthritis of right hip 09/14/2022   Open fracture of left side of mandibular body (HCC) 09/07/2022   Status post total knee replacement 02/17/2022   Hyperlipidemia 02/17/2022   Abnormal gait  02/17/2022   Elevated hemoglobin A1c 01/28/2017   Essential hypertension 04/08/2016   IFG (impaired fasting glucose) 04/09/2014   Traumatic amputation of left  leg below knee, sequela (HCC) 06/16/2013   PCP:  Claiborne Rigg, NP Pharmacy:   [phone] - [place], [place] - [address] AT [facility] [address] [place] [address] Phone: [phone] Fax: [phone]  [id number] [place], [place] - [address] [place] [address] Phone: [phone] Fax: [phone]     Social Determinants of Health (SDOH) Social History: SDOH Screenings   Food Insecurity: No Food Insecurity (11/10/2022)  Housing: Low Risk  (11/10/2022)  Transportation Needs: No Transportation Needs (11/10/2022)  Utilities: Not At Risk (11/10/2022)  Alcohol Screen: Low Risk  (09/11/2022)  Depression (PHQ2-9): Low Risk  (10/20/2022)  Financial Resource Strain: Low Risk  (09/11/2022)  Physical Activity: Inactive (09/11/2022)  Stress: No Stress Concern Present (09/11/2022)  Tobacco Use: Low Risk  (11/10/2022)   SDOH Interventions: Food Insecurity Interventions: Intervention Not Indicated Housing Interventions: Intervention Not Indicated Transportation Interventions: Intervention Not Indicated Utilities Interventions: Intervention Not Indicated   Readmission Risk Interventions     No data to display

## 2022-11-10 NOTE — Evaluation (Signed)
Occupational Therapy Evaluation Patient Details Name: Carl Vang MRN: [id number] DOB: 1960/07/19 Today's Date: 11/10/2022   History of Present Illness  (Pt is 62 year old male who presented with generalized weakness and dysuria.  He was found to have sepsis due to UTI. PMH: L BKA, HTN, THR, TKR)   Clinical Impression   The patient performed all assessed tasks without the need for assistance, appearing to be at or very near to his baseline level of functioning for self-care management. He is not presenting with functional deficits that warrant the need for further OT services. OT will sign off and recommend he return home at discharge.       Recommendations for follow up therapy are one component of a multi-disciplinary discharge planning process, led by the attending physician.  Recommendations may be updated based on patient status, additional functional criteria and insurance authorization.   Assistance Recommended at Discharge None  Patient can return home with the following Direct supervision/assist for medications management    Functional Status Assessment  Patient has not had a recent decline in their functional status  Equipment Recommendations  None recommended by OT       Precautions / Restrictions Precautions Precautions: None Restrictions Weight Bearing Restrictions: No Other Position/Activity Restrictions: old L BKA      Mobility Bed Mobility               General bed mobility comments: Pt was received seated in the bedside chair    Transfers Overall transfer level: Modified independent Equipment used: Rolling walker (2 wheels) Transfers: Sit to/from Stand Sit to Stand: Modified independent (Device/Increase time)                  Balance       Sitting balance - Comments: good       Standing balance comment: Fair+ with RW           ADL either performed or assessed with clinical judgement   ADL Overall ADL's : At  baseline;Independent             Vision Patient Visual Report: No change from baseline              Pertinent Vitals/Pain Pain Assessment Pain Assessment: No/denies pain     Hand Dominance Right   Extremity/Trunk Assessment Upper Extremity Assessment Upper Extremity Assessment: Overall WFL for tasks assessed   Lower Extremity Assessment Lower Extremity Assessment: LLE deficits/detail LLE Deficits / Details: BKA       Communication    Cognition Arousal/Alertness: Awake/alert Behavior During Therapy: WFL for tasks assessed/performed Overall Cognitive Status: Within Functional Limits for tasks assessed            General Comments: Oriented x4, friendly, able to follow commands without difficulty     General Comments       Exercises     Shoulder Instructions      Home Living Family/patient expects to be discharged to:: Private residence Living Arrangements: Other relatives (Sister) Available Help at Discharge: Family;Available PRN/intermittently Type of Home: House Home Access: Stairs to enter Entrance Stairs-Number of Steps: 2 Home Layout: One level               Home Equipment: Agricultural consultant (2 wheels);Cane - single point;BSC/3in1;Shower seat;Wheelchair - manual          Prior Functioning/Environment Prior Level of Function : Independent/Modified Independent             Mobility Comments:  (Uses  a rolling walker or cane) ADLs Comments:  (He was independent with ADLs and cooking. His sister performed most of the cleaning.)        OT Problem List:  N/A      OT Treatment/Interventions:   No further OT treatment needs identified       OT Frequency:  N/A       AM-PAC OT "6 Clicks" Daily Activity     Outcome Measure Help from another person eating meals?: None Help from another person taking care of personal grooming?: None Help from another person toileting, which includes using toliet, bedpan, or urinal?: None Help from  another person bathing (including washing, rinsing, drying)?: None Help from another person to put on and taking off regular upper body clothing?: None Help from another person to put on and taking off regular lower body clothing?: None 6 Click Score: 24   End of Session Equipment Utilized During Treatment: Rolling walker (2 wheels) Nurse Communication: Mobility status  Activity Tolerance: Patient tolerated treatment well Patient left: in chair;with call bell/phone within reach  OT Visit Diagnosis: Muscle weakness (generalized) (M62.81)                Time: 1610-9604 OT Time Calculation (min): 16 min Charges:  OT General Charges $OT Visit: 1 Visit OT Evaluation $OT Eval Low Complexity: 1 Low    Courtland Coppa L Anaelle Dunton, OTR/L 11/10/2022, 1:59 PM

## 2022-11-11 ENCOUNTER — Other Ambulatory Visit (HOSPITAL_COMMUNITY): Payer: Self-pay

## 2022-11-11 DIAGNOSIS — N3000 Acute cystitis without hematuria: Secondary | ICD-10-CM | POA: Diagnosis not present

## 2022-11-11 DIAGNOSIS — E871 Hypo-osmolality and hyponatremia: Secondary | ICD-10-CM | POA: Diagnosis not present

## 2022-11-11 MED ORDER — SODIUM CHLORIDE 0.9 % IV SOLN
1.0000 g | Freq: Once | INTRAVENOUS | Status: AC
  Administered 2022-11-11: 1 g via INTRAVENOUS
  Filled 2022-11-11: qty 10

## 2022-11-11 MED ORDER — CEPHALEXIN 500 MG PO CAPS
500.0000 mg | ORAL_CAPSULE | Freq: Three times a day (TID) | ORAL | 0 refills | Status: AC
  Filled 2022-11-11: qty 21, 7d supply, fill #0

## 2022-11-11 NOTE — Progress Notes (Signed)
Patient provided discharge instructions. Patient understood discharge instructions and verbalized teach back especially with taking antibiotic prescription correctly. Patient's IV removed.

## 2022-11-11 NOTE — TOC Progression Note (Signed)
Transition of Care [facility]) - Progression Note    Patient Details  Name: Carl Vang MRN: [id number] Date of Birth: 1960-08-15  [facility] CM/SW Contact  Howell Rucks, RN Phone Number: 11/11/2022, 10:46 AM  Clinical Narrative:   No follow up  recommendations from PT eval. Will continue to follow.     Expected Discharge Plan: Home w Home Health Services Barriers to Discharge: Continued Medical Work up  Expected Discharge Plan and Services   Discharge Planning Services: CM Consult   Living arrangements for the past 2 months: Single Family Home                                       Social Determinants of Health (SDOH) Interventions SDOH Screenings   Food Insecurity: No Food Insecurity (11/10/2022)  Housing: Low Risk  (11/10/2022)  Transportation Needs: No Transportation Needs (11/10/2022)  Utilities: Not At Risk (11/10/2022)  Alcohol Screen: Low Risk  (09/11/2022)  Depression (PHQ2-9): Low Risk  (10/20/2022)  Financial Resource Strain: Low Risk  (09/11/2022)  Physical Activity: Inactive (09/11/2022)  Stress: No Stress Concern Present (09/11/2022)  Tobacco Use: Low Risk  (11/10/2022)    Readmission Risk Interventions    11/11/2022   10:46 AM  Readmission Risk Prevention Plan  Post Dischage Appt Complete  Medication Screening Complete  Transportation Screening Complete

## 2022-11-11 NOTE — Discharge Summary (Signed)
Physician Discharge Summary  Carl Vang:[id number] DOB: 08/31/1960 DOA: 11/09/2022  PCP: Claiborne Rigg, NP  Admit date: 11/09/2022 Discharge date: 11/11/2022  Admitted From: Home Disposition: Home  Recommendations for Outpatient Follow-up:  Follow up with PCP in 1-2 weeks Please obtain BMP/CBC in one week   Home Health: N/A Equipment/Devices: N/A  Discharge Condition: Stable CODE STATUS: Full code Diet recommendation: Low-salt diet  Discharge summary: 62 year old gentleman with history of hypertension, hyperlipidemia came to the emergency room with about 3 days of dysuria, abdominal pain and generalized weakness with subjective fever.  Diagnosed with Klebsiella UTI on 4/10, treated with Keflex.  In the emergency room temperature 100.6, blood pressure stable.  On room air.  He was found dehydrated, urine was abnormal.  CT scan abdomen pelvis showed no acute intra-abdominal or intrapelvic process.  Negative for hydronephrosis or urinary retention.   Acute UTI, present on admission and the patient with prostatism symptoms. Previously incompletely treated.  Patient was treated with Rocephin day 3 today.  Urine culture with Proteus.  Recent urine culture with Proteus that was sensitive to cephalosporins. Dysuria and suprapubic discomfort has improved.  Continue Keflex 500 mg 3 times daily for 7 additional days.  Will treat as complicated UTI for 10 days. CT scan did not show any evidence of complication. There was no evidence of postvoid residual. Electrolytes are replaced and adequate. Chronic medications are continued. Medically stable for discharge on antibiotics.  Discharge Diagnoses:  Principal Problem:   UTI (urinary tract infection) Active Problems:   Essential hypertension   Hyperlipidemia   Sepsis (HCC)   Generalized weakness   Dehydration   Hypokalemia   Acute hyponatremia    Discharge Instructions  Discharge Instructions     Diet general   Complete by: As  directed    Increase activity slowly   Complete by: As directed       Allergies as of 11/11/2022   No Known Allergies      Medication List     STOP taking these medications    acetaminophen-codeine 300-30 MG tablet Commonly known as: TYLENOL #3   celecoxib 200 MG capsule Commonly known as: CeleBREX   oxyCODONE 5 MG immediate release tablet Commonly known as: Roxicodone   tiZANidine 4 MG tablet Commonly known as: Zanaflex   traMADol 50 MG tablet Commonly known as: ULTRAM       TAKE these medications    amLODipine 10 MG tablet Commonly known as: NORVASC Take 1 tablet (10 mg total) by mouth daily.   aspirin EC 325 MG tablet Take 1 tablet (325 mg total) by mouth 2 (two) times daily after a meal.   atorvastatin 20 MG tablet Commonly known as: LIPITOR Take 1 tablet (20 mg total) by mouth every evening.   cephALEXin 500 MG capsule Commonly known as: KEFLEX Take 1 capsule (500 mg total) by mouth 3 (three) times daily for 7 days.   chlorthalidone 25 MG tablet Commonly known as: HYGROTON Take 1 tablet (25 mg total) by mouth daily.   docusate sodium 100 MG capsule Commonly known as: Colace Take 1 capsule (100 mg total) by mouth 2 (two) times daily as needed (for constipation).   meloxicam 15 MG tablet Commonly known as: MOBIC Take 15 mg by mouth daily.   phenazopyridine 100 MG tablet Commonly known as: Pyridium Take 2 tablets (200 mg total) by mouth 3 (three) times daily as needed for urinary discomfort.        No Known Allergies  Consultations: None   Procedures/Studies: DG Chest Port 1 View  Result Date: 11/10/2022 CLINICAL DATA:  Fever. EXAM: PORTABLE CHEST 1 VIEW COMPARISON:  April 02, 2023 FINDINGS: The heart size and mediastinal contours are within normal limits. Low lung volumes are noted. Mild atelectasis and/or early infiltrate is seen within the medial aspect of the right lung base. There is no evidence of a pleural effusion or  pneumothorax. The visualized skeletal structures are unremarkable. IMPRESSION: Low lung volumes with mild right basilar atelectasis and/or early infiltrate. Electronically Signed   By: Aram Candela M.D.   On: 11/10/2022 01:23   CT ABDOMEN PELVIS WO CONTRAST  Result Date: 11/09/2022 CLINICAL DATA:  Abdominal pain, acute, nonlocalized, urinary tract infection, nausea/vomiting EXAM: CT ABDOMEN AND PELVIS WITHOUT CONTRAST TECHNIQUE: Multidetector CT imaging of the abdomen and pelvis was performed following the standard protocol without IV contrast. RADIATION DOSE REDUCTION: This exam was performed according to the departmental dose-optimization program which includes automated exposure control, adjustment of the mA and/or kV according to patient size and/or use of iterative reconstruction technique. COMPARISON:  10/14/2022 FINDINGS: Lower chest: Bibasilar atelectasis. Cardiac size within normal limits. Gynecomastia. No acute abnormality. Hepatobiliary: Cholelithiasis without pericholecystic inflammatory change. Liver unremarkable. No intra or extrahepatic biliary ductal dilation. Pancreas: Unremarkable Spleen: Unremarkable Adrenals/Urinary Tract: The adrenal glands are unremarkable. The kidneys are normal on this noncontrast examination. Streak artifact slightly limits evaluation of the bladder, however, the visualized portion is unremarkable. Stomach/Bowel: Stomach is within normal limits. Appendix appears normal. No evidence of bowel wall thickening, distention, or inflammatory changes. Vascular/Lymphatic: Aortic atherosclerosis. No enlarged abdominal or pelvic lymph nodes. Reproductive: Mild prostatic hypertrophy Other: No free air.  No abdominopelvic ascites. Musculoskeletal: Asymmetric soft tissue infiltration within the subcutaneous fat of the right hip laterally is likely postsurgical in nature and stable since prior examination. Surgical changes of right total hip arthroplasty are identified.  Degenerative changes are seen within the lumbar spine. No acute bone abnormality. No lytic or blastic bone lesion. IMPRESSION: 1. No acute intra-abdominal pathology identified. No definite radiographic explanation for the patient's reported symptoms. 2. Cholelithiasis. 3. Aortic atherosclerosis. Aortic Atherosclerosis (ICD10-I70.0). Electronically Signed   By: Helyn Numbers M.D.   On: 11/09/2022 22:51   CT ABDOMEN PELVIS W CONTRAST  Result Date: 10/14/2022 CLINICAL DATA:  Right lower quadrant and suprapubic pain with nausea and dysuria. EXAM: CT ABDOMEN AND PELVIS WITH CONTRAST TECHNIQUE: Multidetector CT imaging of the abdomen and pelvis was performed using the standard protocol following bolus administration of intravenous contrast. RADIATION DOSE REDUCTION: This exam was performed according to the departmental dose-optimization program which includes automated exposure control, adjustment of the mA and/or kV according to patient size and/or use of iterative reconstruction technique. CONTRAST:  OMNIPAQUE IOHEXOL 300 MG/ML  SOLN COMPARISON:  None Available. FINDINGS: Lower chest: Minimal dependent atelectasis bilaterally. Atherosclerotic calcification of the aortic valve. Heart is at the upper limits of normal in size to mildly enlarged. No pericardial or pleural effusion. Distal esophagus is grossly unremarkable. Hepatobiliary: Liver is unremarkable. Tiny stones in the gallbladder. No biliary ductal dilatation. Pancreas: Negative. Spleen: Negative. Adrenals/Urinary Tract: Adrenal glands and kidneys are unremarkable. Ureters are decompressed. Bladder is largely obscured by streak artifact from a right hip arthroplasty. Stomach/Bowel: Stomach, small bowel and colon are unremarkable. Stool is seen in the ascending and transverse colon. Appendix is not visualized. Vascular/Lymphatic: Atherosclerotic calcification of the aorta. No pathologically enlarged lymph nodes. Reproductive: Prostate is enlarged.  Other: No free fluid.  Mesenteries and peritoneum  are unremarkable. Musculoskeletal: Right hip arthroplasty. Degenerative changes in the spine. No worrisome lytic or sclerotic lesions. IMPRESSION: 1. No acute findings to explain the patient's pain. 2. Stool in the ascending and transverse colon may be due to constipation. 3. Cholelithiasis. 4. Enlarged prostate. 5.  Aortic atherosclerosis (ICD10-I70.0). Electronically Signed   By: Leanna Battles M.D.   On: 10/14/2022 09:20   (Echo, Carotid, EGD, Colonoscopy, ERCP)    Subjective: Patient seen and examined.  No overnight events.  Still has occasional suprapubic discomfort but he is completely voiding.  Urine is clear.  Afebrile.   Discharge Exam: Vitals:   11/11/22 0428 11/11/22 0800  BP: 114/77   Pulse: 97   Resp: 20 18  Temp: 98.4 F (36.9 C)   SpO2: 97%    Vitals:   11/10/22 1412 11/10/22 2046 11/11/22 0428 11/11/22 0800  BP: 131/73 124/81 114/77   Pulse: 88 82 97   Resp: 18 16 20 18   Temp: 97.8 F (36.6 C) 98.9 F (37.2 C) 98.4 F (36.9 C)   TempSrc: Oral Oral Oral   SpO2: 98% 92% 97%   Weight:   75.7 kg   Height:        General: Pt is alert, awake, not in acute distress Cardiovascular: RRR, S1/S2 +, no rubs, no gallops Respiratory: CTA bilaterally, no wheezing, no rhonchi Abdominal: Soft, NT, ND, bowel sounds + Extremities: no edema, no cyanosis    The results of significant diagnostics from this hospitalization (including imaging, microbiology, ancillary and laboratory) are listed below for reference.     Microbiology: Recent Results (from the past 240 hour(s))  Urine Culture     Status: Abnormal (Preliminary result)   Collection Time: 11/09/22  9:23 PM   Specimen: Urine, Random  Result Value Ref Range Status   Specimen Description   Final    URINE, RANDOM Performed at [facility], [address], [place], [place] [address]    Special Requests   Final    NONE Reflexed from  [id number] Performed at [facility], [address], [place], [place] [address]    Culture (A)  Final    >=100,000 COLONIES/mL PROTEUS MIRABILIS SUSCEPTIBILITIES TO FOLLOW Performed at [facility], [address] [place], [place] [address]    Report Status PENDING  Incomplete     Labs: BNP (last 3 results) No results for input(s): "BNP" in the last 8760 hours. Basic Metabolic Panel: Recent Labs  Lab 11/09/22 2120 11/10/22 0512  NA 129* 131*  K 3.0* 3.3*  CL 92* 95*  CO2 24 27  GLUCOSE 112* 112*  BUN 26* 20  CREATININE 1.15 1.03  CALCIUM 8.7* 8.1*  MG  --  2.0   Liver Function Tests: Recent Labs  Lab 11/09/22 2120 11/10/22 0512  AST 28 24  ALT 28 25  ALKPHOS 66 52  BILITOT 1.1 0.7  PROT 8.2* 6.6  ALBUMIN 3.8 3.1*   No results for input(s): "LIPASE", "AMYLASE" in the last 168 hours. No results for input(s): "AMMONIA" in the last 168 hours. CBC: Recent Labs  Lab 11/09/22 2120 11/10/22 0512  WBC 14.8* 12.3*  NEUTROABS 11.9* 8.7*  HGB 13.2 11.6*  HCT 39.9 35.9*  MCV 82.4 83.1  PLT 246 227   Cardiac Enzymes: No results for input(s): "CKTOTAL", "CKMB", "CKMBINDEX", "TROPONINI" in the last 168 hours. BNP: Invalid input(s): "POCBNP" CBG: No results for input(s): "GLUCAP" in the last 168 hours. D-Dimer No results for input(s): "DDIMER" in  the last 72 hours. Hgb A1c No results for input(s): "HGBA1C" in the last 72 hours. Lipid Profile No results for input(s): "CHOL", "HDL", "LDLCALC", "TRIG", "CHOLHDL", "LDLDIRECT" in the last 72 hours. Thyroid function studies Recent Labs    11/10/22 0512  TSH 1.354   Anemia work up No results for input(s): "VITAMINB12", "FOLATE", "FERRITIN", "TIBC", "IRON", "RETICCTPCT" in the last 72 hours. Urinalysis    Component Value Date/Time   COLORURINE YELLOW 11/09/2022 2123   APPEARANCEUR HAZY (A) 11/09/2022 2123   APPEARANCEUR Clear 02/27/2019 1146   LABSPEC 1.019 11/09/2022 2123   PHURINE 8.0  11/09/2022 2123   GLUCOSEU NEGATIVE 11/09/2022 2123   HGBUR NEGATIVE 11/09/2022 2123   BILIRUBINUR NEGATIVE 11/09/2022 2123   BILIRUBINUR Negative 02/27/2019 1146   KETONESUR 20 (A) 11/09/2022 2123   PROTEINUR 30 (A) 11/09/2022 2123   UROBILINOGEN 0.2 04/09/2014 1015   NITRITE POSITIVE (A) 11/09/2022 2123   LEUKOCYTESUR LARGE (A) 11/09/2022 2123   Sepsis Labs Recent Labs  Lab 11/09/22 2120 11/10/22 0512  WBC 14.8* 12.3*   Microbiology Recent Results (from the past 240 hour(s))  Urine Culture     Status: Abnormal (Preliminary result)   Collection Time: 11/09/22  9:23 PM   Specimen: Urine, Random  Result Value Ref Range Status   Specimen Description   Final    URINE, RANDOM Performed at [facility], [address] [place], [place] [address]    Special Requests   Final    NONE Reflexed from [id number] Performed at [facility], [address], [place], [place] [address]    Culture (A)  Final    >=100,000 COLONIES/mL PROTEUS MIRABILIS SUSCEPTIBILITIES TO FOLLOW Performed at [facility], [address], [place], [place] [address]    Report Status PENDING  Incomplete     Time coordinating discharge: 32 minutes  SIGNED:   Dorcas Carrow, MD  [facility] 11/11/2022, 11:48 AM

## 2022-11-12 ENCOUNTER — Telehealth: Payer: Self-pay | Admitting: *Deleted

## 2022-11-12 LAB — URINE CULTURE: Culture: >=100000 — AB

## 2022-11-12 NOTE — Transitions of Care (Post Inpatient/ED Visit) (Signed)
11/12/2022  Name: Carl Vang MRN: [id number] DOB: Oct 09, 1960  Today's TOC FU Call Status: Today's TOC FU Call Status:: Successful TOC FU Call Competed TOC FU Call Complete Date: 11/12/22  Transition Care Management Follow-up Telephone Call Date of Discharge: 11/11/22 Discharge Facility: [facility]) Type of Discharge: Inpatient Admission Primary Inpatient Discharge Diagnosis:: UTI How have you been since you were released from the hospital?: Better (so much better than  before I went in to the hospital) Any questions or concerns?: No  Items Reviewed: Medications obtained,verified, and reconciled?: Yes (Medications Reviewed) Any new allergies since your discharge?: No Dietary orders reviewed?: No Do you have support at home?: Yes People in Home: alone Name of Support/Comfort Primary Source: Steward [employer]  Medications Reviewed Today: Medications Reviewed Today     Reviewed by Luella Cook, RN (Case Manager) on 11/12/22 at 1603  Med List Status: <None>   Medication Order Taking? Sig Documenting Provider Last Dose Status Informant  amLODipine (NORVASC) 10 MG tablet [id number] Yes Take 1 tablet (10 mg total) by mouth daily. Hoy Register, MD Taking Active Self, Pharmacy Records  aspirin EC 325 MG tablet [id number] Yes Take 1 tablet (325 mg total) by mouth 2 (two) times daily after a meal. Shanon Payor, PA-C Taking Active Self, Pharmacy Records  atorvastatin (LIPITOR) 20 MG tablet [id number] Yes Take 1 tablet (20 mg total) by mouth every evening. Hoy Register, MD Taking Active Self, Pharmacy Records  cephALEXin [facility]) 500 MG capsule [id number] Yes Take 1 capsule (500 mg total) by mouth 3 (three) times daily for 7 days. Dorcas Carrow, MD Taking Active   chlorthalidone (HYGROTON) 25 MG tablet [id number] Yes Take 1 tablet (25 mg total) by mouth daily. Hoy Register, MD Taking Active Self, Pharmacy Records  docusate sodium (COLACE) 100 MG capsule [id number] Yes Take 1 capsule  (100 mg total) by mouth 2 (two) times daily as needed (for constipation). Shanon Payor, PA-C Taking Active Self, Pharmacy Records  meloxicam [facility]) 15 MG tablet [id number] Yes Take 15 mg by mouth daily. [provider] Taking Active Self, Pharmacy Records  phenazopyridine (PYRIDIUM) 100 MG tablet [id number] Yes Take 2 tablets (200 mg total) by mouth 3 (three) times daily as needed for urinary discomfort.  Taking Active Self, Pharmacy Records            Home Care and Equipment/Supplies: Were Home Health Services Ordered?: NA Any new equipment or medical supplies ordered?: NA  Functional Questionnaire: Do you need assistance with bathing/showering or dressing?: No Do you need assistance with meal preparation?: No Do you need assistance with eating?: No Do you have difficulty maintaining continence: No Do you need assistance with getting out of bed/getting out of a chair/moving?: No Do you have difficulty managing or taking your medications?: No  Follow up appointments reviewed: PCP Follow-up appointment confirmed?: Yes Date of PCP follow-up appointment?: 11/18/22 Follow-up Provider: Bertram Denver 2:10 Specialist [facility] Follow-up appointment confirmed?: NA Do you need transportation to your follow-up appointment?: No Do you understand care options if your condition(s) worsen?: Yes-patient verbalized understanding  SDOH Interventions Today    Flowsheet Row Most Recent Value  SDOH Interventions   Food Insecurity Interventions Intervention Not Indicated  Housing Interventions Intervention Not Indicated  Transportation Interventions Intervention Not Indicated      Interventions Today    Flowsheet Row Most Recent Value  General Interventions   General Interventions Discussed/Reviewed General Interventions Discussed, General Interventions Reviewed, Doctor Visits  Doctor Visits Discussed/Reviewed PCP  PCP/Specialist Visits  Compliance with follow-up visit  Pharmacy  Interventions   Pharmacy Dicussed/Reviewed Pharmacy Topics Discussed, Pharmacy Topics Reviewed      TOC Interventions Today    Flowsheet Row Most Recent Value  TOC Interventions   TOC Interventions Discussed/Reviewed TOC Interventions Discussed, TOC Interventions Reviewed, Arranged PCP follow up within 7 days/Care Guide scheduled        Gean Maidens BSN RN [facility] [phone]

## 2022-11-18 ENCOUNTER — Encounter: Payer: Self-pay | Admitting: Nurse Practitioner

## 2022-11-18 ENCOUNTER — Ambulatory Visit: Payer: 59 | Attending: Nurse Practitioner | Admitting: Nurse Practitioner

## 2022-11-18 VITALS — BP 118/64 | HR 84 | Ht 65.0 in | Wt 174.6 lb

## 2022-11-18 DIAGNOSIS — E876 Hypokalemia: Secondary | ICD-10-CM | POA: Diagnosis not present

## 2022-11-18 DIAGNOSIS — Z09 Encounter for follow-up examination after completed treatment for conditions other than malignant neoplasm: Secondary | ICD-10-CM

## 2022-11-18 DIAGNOSIS — Z87440 Personal history of urinary (tract) infections: Secondary | ICD-10-CM | POA: Diagnosis not present

## 2022-11-18 DIAGNOSIS — D72829 Elevated white blood cell count, unspecified: Secondary | ICD-10-CM | POA: Diagnosis not present

## 2022-11-18 NOTE — Progress Notes (Signed)
No concerns. 
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
## 2022-11-19 LAB — CBC WITH DIFFERENTIAL/PLATELET
Basophils Absolute: 0.1 10*3/uL (ref 0.0–0.2)
Basos: 1 %
EOS (ABSOLUTE): 0.3 10*3/uL (ref 0.0–0.4)
Eos: 3 %
Hematocrit: 37.2 % — ABNORMAL LOW (ref 37.5–51.0)
Hemoglobin: 11.9 g/dL — ABNORMAL LOW (ref 13.0–17.7)
Immature Grans (Abs): 0.1 10*3/uL (ref 0.0–0.1)
Immature Granulocytes: 1 %
Lymphocytes Absolute: 3.6 10*3/uL — ABNORMAL HIGH (ref 0.7–3.1)
Lymphs: 34 %
MCH: 26.7 pg (ref 26.6–33.0)
MCHC: 32 g/dL (ref 31.5–35.7)
MCV: 84 fL (ref 79–97)
Monocytes Absolute: 0.9 10*3/uL (ref 0.1–0.9)
Monocytes: 8 %
Neutrophils Absolute: 5.7 10*3/uL (ref 1.4–7.0)
Neutrophils: 53 %
Platelets: 501 10*3/uL — ABNORMAL HIGH (ref 150–450)
RBC: 4.45 x10E6/uL (ref 4.14–5.80)
RDW: 14 % (ref 11.6–15.4)
WBC: 10.7 10*3/uL (ref 3.4–10.8)

## 2022-11-19 LAB — URINALYSIS, COMPLETE
Bilirubin, UA: NEGATIVE
Glucose, UA: NEGATIVE
Ketones, UA: NEGATIVE
Nitrite, UA: NEGATIVE
Protein,UA: NEGATIVE
RBC, UA: NEGATIVE
Specific Gravity, UA: 1.013 (ref 1.005–1.030)
Urobilinogen, Ur: 0.2 mg/dL (ref 0.2–1.0)
pH, UA: 6 (ref 5.0–7.5)

## 2022-11-19 LAB — CMP14+EGFR
ALT: 31 IU/L (ref 0–44)
AST: 31 IU/L (ref 0–40)
Albumin/Globulin Ratio: 1.4 (ref 1.2–2.2)
Albumin: 4 g/dL (ref 3.9–4.9)
Alkaline Phosphatase: 81 IU/L (ref 44–121)
BUN/Creatinine Ratio: 17 (ref 10–24)
BUN: 16 mg/dL (ref 8–27)
Bilirubin Total: 0.3 mg/dL (ref 0.0–1.2)
CO2: 26 mmol/L (ref 20–29)
Calcium: 9.9 mg/dL (ref 8.6–10.2)
Chloride: 99 mmol/L (ref 96–106)
Creatinine, Ser: 0.92 mg/dL (ref 0.76–1.27)
Globulin, Total: 2.9 g/dL (ref 1.5–4.5)
Glucose: 98 mg/dL (ref 70–99)
Potassium: 4.2 mmol/L (ref 3.5–5.2)
Sodium: 138 mmol/L (ref 134–144)
Total Protein: 6.9 g/dL (ref 6.0–8.5)
eGFR: 95 mL/min/{1.73_m2} (ref >59)

## 2022-11-19 LAB — MICROSCOPIC EXAMINATION: Casts: NONE SEEN /lpf

## 2022-11-21 LAB — URINE CULTURE

## 2022-11-22 DIAGNOSIS — R269 Unspecified abnormalities of gait and mobility: Secondary | ICD-10-CM | POA: Diagnosis not present

## 2022-11-22 DIAGNOSIS — S88112S Complete traumatic amputation at level between knee and ankle, left lower leg, sequela: Secondary | ICD-10-CM | POA: Diagnosis not present

## 2022-12-10 ENCOUNTER — Ambulatory Visit: Payer: Self-pay

## 2022-12-10 ENCOUNTER — Other Ambulatory Visit (HOSPITAL_COMMUNITY)
Admission: RE | Admit: 2022-12-10 | Discharge: 2022-12-10 | Disposition: A | Discharge status: Home / Self Care | Payer: 59 | Source: Ambulatory Visit | Attending: Physician Assistant | Admitting: Physician Assistant

## 2022-12-10 ENCOUNTER — Encounter: Payer: Self-pay | Admitting: Physician Assistant

## 2022-12-10 ENCOUNTER — Ambulatory Visit: Payer: 59 | Attending: Physician Assistant | Admitting: Physician Assistant

## 2022-12-10 VITALS — BP 119/70 | HR 86 | Temp 98.1°F | Resp 14 | Ht 65.0 in | Wt 171.8 lb

## 2022-12-10 DIAGNOSIS — N390 Urinary tract infection, site not specified: Secondary | ICD-10-CM | POA: Diagnosis not present

## 2022-12-10 DIAGNOSIS — R300 Dysuria: Secondary | ICD-10-CM | POA: Insufficient documentation

## 2022-12-10 LAB — POCT URINALYSIS DIP (CLINITEK)
Bilirubin, UA: NEGATIVE
Glucose, UA: NEGATIVE mg/dL
Nitrite, UA: NEGATIVE
POC PROTEIN,UA: NEGATIVE
Spec Grav, UA: 1.02 (ref 1.010–1.025)
Urobilinogen, UA: NEGATIVE E.U./dL — AB
pH, UA: 6 (ref 5.0–8.0)

## 2022-12-10 MED ORDER — CEFTRIAXONE SODIUM 500 MG IJ SOLR
500.0000 mg | Freq: Once | INTRAMUSCULAR | Status: AC
Start: 2022-12-10 — End: 2022-12-10
  Administered 2022-12-10: 500 mg via INTRAMUSCULAR

## 2022-12-10 MED ORDER — SULFAMETHOXAZOLE-TRIMETHOPRIM 800-160 MG PO TABS
1.0000 | ORAL_TABLET | Freq: Two times a day (BID) | ORAL | 0 refills | Status: DC
Start: 2022-12-10 — End: 2023-06-15

## 2022-12-10 NOTE — Telephone Encounter (Signed)
Pt has been seen today in office and treated.

## 2022-12-10 NOTE — Progress Notes (Signed)
Pt is here for possible UTI   Complaining of frequent urination and painful urination, burning sensation started X1 month ago

## 2022-12-10 NOTE — Telephone Encounter (Signed)
  Chief Complaint: burning with urination  Symptoms: low back pain Frequency: today  Pertinent Negatives: Patient denies fever, abdominal pain, blood in urine Disposition: [] ED /[] Urgent Care (no appt availability in office) / [x] Appointment(In office/virtual)/ []  [facility]/ [] Home Care/ [] Refused Recommended Disposition /[] [facility]/ []  Follow-up with PCP Additional Notes: pt stated he can be here in 20 minutes.  See chart- pt hospitalized and given IV abx and then after po abx and sx persists Reason for Disposition  Side (flank) or lower back pain present  Answer Assessment - Initial Assessment Questions 1. SEVERITY: "How bad is the pain?"  (e.g., Scale 1-10; mild, moderate, or severe)   - MILD (1-3): Complains slightly about urination hurting.   - MODERATE (4-7): Interferes with normal activities.     - SEVERE (8-10): Excruciating, unwilling or unable to urinate because of the pain.      moderate  4. ONSET: "When did the painful urination start?"      today 5. FEVER: "Do you have a fever?" If Yes, ask: "What is your temperature, how was it measured, and when did it start?"     No  6. PAST UTI: "Have you had a urine infection before?" If Yes, ask: "When was the last time?" and "What happened that time?"      Yes  7. CAUSE: "What do you think is causing the painful urination?"      UTI 8. OTHER SYMPTOMS: "Do you have any other symptoms?" (e.g., flank pain, penis discharge, scrotal pain, blood in urine)     Back pain  Protocols used: Urination Pain - Male-A-AH

## 2022-12-10 NOTE — Patient Instructions (Signed)
Drink 64 to 80 ounces water daily  Urinary Tract Infection, Adult  A urinary tract infection (UTI) is an infection of any part of the urinary tract. The urinary tract includes the kidneys, ureters, bladder, and urethra. These organs make, store, and get rid of urine in the body. An upper UTI affects the ureters and kidneys. A lower UTI affects the bladder and urethra. What are the causes? Most urinary tract infections are caused by bacteria in your genital area around your urethra, where urine leaves your body. These bacteria grow and cause inflammation of your urinary tract. What increases the risk? You are more likely to develop this condition if: You have a urinary catheter that stays in place. You are not able to control when you urinate or have a bowel movement (incontinence). You are male and you: Use a spermicide or diaphragm for birth control. Have low estrogen levels. Are pregnant. You have certain genes that increase your risk. You are sexually active. You take antibiotic medicines. You have a condition that causes your flow of urine to slow down, such as: An enlarged prostate, if you are male. Blockage in your urethra. A kidney stone. A nerve condition that affects your bladder control (neurogenic bladder). Not getting enough to drink, or not urinating often. You have certain medical conditions, such as: Diabetes. A weak disease-fighting system (immunesystem). Sickle cell disease. Gout. Spinal cord injury. What are the signs or symptoms? Symptoms of this condition include: Needing to urinate right away (urgency). Frequent urination. This may include small amounts of urine each time you urinate. Pain or burning with urination. Blood in the urine. Urine that smells bad or unusual. Trouble urinating. Cloudy urine. Vaginal discharge, if you are male. Pain in the abdomen or the lower back. You may also have: Vomiting or a decreased  appetite. Confusion. Irritability or tiredness. A fever or chills. Diarrhea. The first symptom in older adults may be confusion. In some cases, they may not have any symptoms until the infection has worsened. How is this diagnosed? This condition is diagnosed based on your medical history and a physical exam. You may also have other tests, including: Urine tests. Blood tests. Tests for STIs (sexually transmitted infections). If you have had more than one UTI, a cystoscopy or imaging studies may be done to determine the cause of the infections. How is this treated? Treatment for this condition includes: Antibiotic medicine. Over-the-counter medicines to treat discomfort. Drinking enough water to stay hydrated. If you have frequent infections or have other conditions such as a kidney stone, you may need to see a health care provider who specializes in the urinary tract (urologist). In rare cases, urinary tract infections can cause sepsis. Sepsis is a life-threatening condition that occurs when the body responds to an infection. Sepsis is treated in the hospital with IV antibiotics, fluids, and other medicines. Follow these instructions at home:  Medicines Take over-the-counter and prescription medicines only as told by your health care provider. If you were prescribed an antibiotic medicine, take it as told by your health care provider. Do not stop using the antibiotic even if you start to feel better. General instructions Make sure you: Empty your bladder often and completely. Do not hold urine for long periods of time. Empty your bladder after sex. Wipe from front to back after urinating or having a bowel movement if you are male. Use each tissue only one time when you wipe. Drink enough fluid to keep your urine pale yellow. Keep all  follow-up visits. This is important. Contact a health care provider if: Your symptoms do not get better after 1-2 days. Your symptoms go away and then  return. Get help right away if: You have severe pain in your back or your lower abdomen. You have a fever or chills. You have nausea or vomiting. Summary A urinary tract infection (UTI) is an infection of any part of the urinary tract, which includes the kidneys, ureters, bladder, and urethra. Most urinary tract infections are caused by bacteria in your genital area. Treatment for this condition often includes antibiotic medicines. If you were prescribed an antibiotic medicine, take it as told by your health care provider. Do not stop using the antibiotic even if you start to feel better. Keep all follow-up visits. This is important. This information is not intended to replace advice given to you by your health care provider. Make sure you discuss any questions you have with your health care provider. Document Revised: 01/28/2020 Document Reviewed: 02/02/2020 Elsevier Patient Education  2024 [employer].

## 2022-12-10 NOTE — Progress Notes (Signed)
Patient ID: Carl Vang, male   DOB: [birth year], 62 y.o.   MRN: [id number]    Carl Vang, is a 62 y.o. male  [id number]  [id number]  DOB - 1961-03-08  No chief complaint on file.      Subjective:   Carl Vang is a 62 y.o. male here today for a recurrence of burning with urination.  He has had multiple UTI since hip replacement about 2 months ago.  He was hospitalized in May.  He saw his PCP Mid May and symptoms had resolved but he says within a few days of being off the antibiotics the symptoms came back.  No nausea.  No vomiting or abdominal pain.  No fever.    No problems updated.  ALLERGIES: No Known Allergies  PAST MEDICAL HISTORY: Past Medical History:  Diagnosis Date   Hyperlipidemia    Hypertension    Osteoarthritis of knee    Pre-diabetes    pt denies   Prediabetes     MEDICATIONS AT HOME: Prior to Admission medications   Medication Sig Start Date End Date Taking? Authorizing Provider  amLODipine (NORVASC) 10 MG tablet Take 1 tablet (10 mg total) by mouth daily. 07/15/22  Yes Hoy Register, MD  aspirin EC 325 MG tablet Take 1 tablet (325 mg total) by mouth 2 (two) times daily after a meal. 09/17/22  Yes Shanon Payor, PA-C  atorvastatin (LIPITOR) 20 MG tablet Take 1 tablet (20 mg total) by mouth every evening. 07/15/22 01/11/23 Yes Hoy Register, MD  chlorthalidone (HYGROTON) 25 MG tablet Take 1 tablet (25 mg total) by mouth daily. 07/15/22  Yes Hoy Register, MD  docusate sodium (COLACE) 100 MG capsule Take 1 capsule (100 mg total) by mouth 2 (two) times daily as needed (for constipation). 09/17/22 09/17/23 Yes Shanon Payor, PA-C  meloxicam (MOBIC) 15 MG tablet Take 15 mg by mouth daily. 10/15/22  Yes [provider]  sulfamethoxazole-trimethoprim (BACTRIM DS) 800-160 MG tablet Take 1 tablet by mouth 2 (two) times daily. 12/10/22  Yes Anders Simmonds, PA-C  phenazopyridine (PYRIDIUM) 100 MG tablet Take 2 tablets (200 mg total) by mouth 3  (three) times daily as needed for urinary discomfort. Patient not taking: Reported on 12/10/2022 10/13/22       ROS: Neg HEENT Neg resp Neg cardiac Neg GI Neg GU Neg MS Neg psych Neg neuro  Objective:   Vitals:   12/10/22 1347  BP: 119/70  Pulse: 86  Resp: 14  Temp: 98.1 F (36.7 C)  SpO2: 97%  Weight: 171 lb 12.8 oz (77.9 kg)  Height: 5\' 5"  (1.651 m)   Exam General appearance : Awake, alert, not in any distress. Speech Clear. Not toxic looking HEENT: Atraumatic and Normocephalic, pupils equally reactive to light and accomodation Neck: Supple, no JVD. No cervical lymphadenopathy.  Chest: Good air entry bilaterally, CTAB.  No rales/rhonchi/wheezing CVS: S1 S2 regular, no murmurs.  Abdomen: Bowel sounds present, Non tender and not distended with no gaurding, rigidity or rebound. Extremities: B/L Lower Ext shows no edema, both legs are warm to touch Neurology: Awake alert, and oriented X 3, CN II-XII intact, Non focal Skin: No Rash  Data Review Lab Results  Component Value Date   HGBA1C 5.8 (H) 08/07/2022   HGBA1C 5.9 (H) 02/17/2022   HGBA1C 5.8 (A) 02/27/2019    Assessment & Plan   1. Burning with urination - POCT URINALYSIS DIP (CLINITEK) - Urine cytology ancillary only - Urine Culture - Ambulatory referral to Urology -  cefTRIAXone (ROCEPHIN) injection 500 mg Increase water intake!!!  2. Recurrent UTI - Urine cytology ancillary only - Urine Culture - Ambulatory referral to Urology - cefTRIAXone (ROCEPHIN) injection 500 mg - sulfamethoxazole-trimethoprim (BACTRIM DS) 800-160 MG tablet; Take 1 tablet by mouth 2 (two) times daily.  Dispense: 28 tablet; Refill: 0    Return for keep appt with Bertram Denver next month.  The patient was given clear instructions to go to ER or return to medical center if symptoms don't improve, worsen or new problems develop. The patient verbalized understanding. The patient was told to call to get lab results if they haven't  heard anything in the next week.      Georgian Co, PA-C [facility] [place], [place] [phone]   12/10/2022, 2:10 PM

## 2022-12-11 LAB — URINE CYTOLOGY ANCILLARY ONLY
Chlamydia: NEGATIVE
Comment: NEGATIVE
Comment: NEGATIVE
Comment: NORMAL
Neisseria Gonorrhea: NEGATIVE
Trichomonas: NEGATIVE

## 2022-12-13 LAB — URINE CULTURE

## 2022-12-15 LAB — URINE CULTURE

## 2022-12-16 ENCOUNTER — Telehealth: Payer: Self-pay

## 2022-12-16 NOTE — Telephone Encounter (Signed)
Patient called, no answer, no VM set up, recording to try the call again later. Give results below if returns the call.  Message from Aretta Nip sent at 12/16/2022 10:03 AM EDT  Summary: lab result and instructions   Pls call pt back for results, was holding NT and have lost connection. Fu at [phone]          Anders Simmonds, PA-C 12/15/2022  3:21 PM EDT     Please call patient. The urine culture showed an infection that the antibiotics I gave him should cure. Please tell him to drink a minimum of 64 ounces of water daily to ensure completely clearing the urinary tract of bacteria. Follow up with urology. Thanks, Georgian Co, PA-C

## 2022-12-16 NOTE — Telephone Encounter (Signed)
This encounter was created in error - please disregard.

## 2022-12-23 DIAGNOSIS — R269 Unspecified abnormalities of gait and mobility: Secondary | ICD-10-CM | POA: Diagnosis not present

## 2022-12-23 DIAGNOSIS — S88112S Complete traumatic amputation at level between knee and ankle, left lower leg, sequela: Secondary | ICD-10-CM | POA: Diagnosis not present

## 2022-12-25 ENCOUNTER — Encounter: Payer: Self-pay | Admitting: *Deleted

## 2022-12-31 DIAGNOSIS — R300 Dysuria: Secondary | ICD-10-CM | POA: Diagnosis not present

## 2022-12-31 DIAGNOSIS — R350 Frequency of micturition: Secondary | ICD-10-CM | POA: Diagnosis not present

## 2023-01-15 DIAGNOSIS — B351 Tinea unguium: Secondary | ICD-10-CM | POA: Diagnosis not present

## 2023-01-19 ENCOUNTER — Ambulatory Visit: Payer: 59 | Admitting: Nurse Practitioner

## 2023-01-22 DIAGNOSIS — S88112S Complete traumatic amputation at level between knee and ankle, left lower leg, sequela: Secondary | ICD-10-CM | POA: Diagnosis not present

## 2023-01-22 DIAGNOSIS — R269 Unspecified abnormalities of gait and mobility: Secondary | ICD-10-CM | POA: Diagnosis not present

## 2023-02-03 DIAGNOSIS — R300 Dysuria: Secondary | ICD-10-CM | POA: Diagnosis not present

## 2023-02-03 DIAGNOSIS — R350 Frequency of micturition: Secondary | ICD-10-CM | POA: Diagnosis not present

## 2023-02-22 DIAGNOSIS — R269 Unspecified abnormalities of gait and mobility: Secondary | ICD-10-CM | POA: Diagnosis not present

## 2023-02-22 DIAGNOSIS — S88112S Complete traumatic amputation at level between knee and ankle, left lower leg, sequela: Secondary | ICD-10-CM | POA: Diagnosis not present

## 2023-02-25 ENCOUNTER — Other Ambulatory Visit: Payer: Self-pay | Admitting: Family Medicine

## 2023-02-25 DIAGNOSIS — E782 Mixed hyperlipidemia: Secondary | ICD-10-CM

## 2023-03-15 ENCOUNTER — Ambulatory Visit: Payer: 59 | Admitting: Nurse Practitioner

## 2023-03-25 DIAGNOSIS — S88112S Complete traumatic amputation at level between knee and ankle, left lower leg, sequela: Secondary | ICD-10-CM | POA: Diagnosis not present

## 2023-03-25 DIAGNOSIS — R269 Unspecified abnormalities of gait and mobility: Secondary | ICD-10-CM | POA: Diagnosis not present

## 2023-04-05 ENCOUNTER — Ambulatory Visit: Payer: 59 | Admitting: Nurse Practitioner

## 2023-04-16 DIAGNOSIS — B353 Tinea pedis: Secondary | ICD-10-CM | POA: Diagnosis not present

## 2023-04-24 DIAGNOSIS — S88112S Complete traumatic amputation at level between knee and ankle, left lower leg, sequela: Secondary | ICD-10-CM | POA: Diagnosis not present

## 2023-04-24 DIAGNOSIS — R269 Unspecified abnormalities of gait and mobility: Secondary | ICD-10-CM | POA: Diagnosis not present

## 2023-05-04 ENCOUNTER — Ambulatory Visit: Payer: 59 | Admitting: Nurse Practitioner

## 2023-06-09 DIAGNOSIS — R350 Frequency of micturition: Secondary | ICD-10-CM | POA: Diagnosis not present

## 2023-06-09 DIAGNOSIS — R300 Dysuria: Secondary | ICD-10-CM | POA: Diagnosis not present

## 2023-06-11 ENCOUNTER — Other Ambulatory Visit: Payer: Self-pay | Admitting: Family Medicine

## 2023-06-11 DIAGNOSIS — E782 Mixed hyperlipidemia: Secondary | ICD-10-CM

## 2023-06-15 ENCOUNTER — Ambulatory Visit: Payer: 59 | Attending: Nurse Practitioner | Admitting: Nurse Practitioner

## 2023-06-15 ENCOUNTER — Encounter: Payer: Self-pay | Admitting: Nurse Practitioner

## 2023-06-15 VITALS — BP 118/68 | HR 75 | Ht 65.0 in | Wt 191.8 lb

## 2023-06-15 DIAGNOSIS — R7303 Prediabetes: Secondary | ICD-10-CM | POA: Diagnosis not present

## 2023-06-15 DIAGNOSIS — K5909 Other constipation: Secondary | ICD-10-CM

## 2023-06-15 DIAGNOSIS — Z23 Encounter for immunization: Secondary | ICD-10-CM | POA: Diagnosis not present

## 2023-06-15 DIAGNOSIS — H903 Sensorineural hearing loss, bilateral: Secondary | ICD-10-CM

## 2023-06-15 DIAGNOSIS — E7800 Pure hypercholesterolemia, unspecified: Secondary | ICD-10-CM | POA: Diagnosis not present

## 2023-06-15 DIAGNOSIS — I10 Essential (primary) hypertension: Secondary | ICD-10-CM | POA: Diagnosis not present

## 2023-06-15 DIAGNOSIS — D508 Other iron deficiency anemias: Secondary | ICD-10-CM

## 2023-06-15 DIAGNOSIS — E782 Mixed hyperlipidemia: Secondary | ICD-10-CM | POA: Diagnosis not present

## 2023-06-15 MED ORDER — AMLODIPINE BESYLATE 10 MG PO TABS
10.0000 mg | ORAL_TABLET | Freq: Every day | ORAL | 1 refills | Status: DC
Start: 2023-06-15 — End: 2023-12-08

## 2023-06-15 MED ORDER — ASPIRIN 325 MG PO TBEC: 325.0000 mg | DELAYED_RELEASE_TABLET | Freq: Two times a day (BID) | ORAL | 1 refills | Status: DC

## 2023-06-15 MED ORDER — ASPIRIN 325 MG PO TBEC: 325.0000 mg | DELAYED_RELEASE_TABLET | Freq: Every day | ORAL | 1 refills | Status: DC

## 2023-06-15 MED ORDER — DOCUSATE SODIUM 100 MG PO CAPS
100.0000 mg | ORAL_CAPSULE | Freq: Two times a day (BID) | ORAL | 1 refills | Status: AC | PRN
Start: 2023-06-15 — End: ?

## 2023-06-15 MED ORDER — CHLORTHALIDONE 25 MG PO TABS
25.0000 mg | ORAL_TABLET | Freq: Every day | ORAL | 1 refills | Status: DC
Start: 2023-06-15 — End: 2023-12-14

## 2023-06-15 MED ORDER — ATORVASTATIN CALCIUM 20 MG PO TABS
20.0000 mg | ORAL_TABLET | Freq: Every evening | ORAL | 1 refills | Status: AC
Start: 2023-06-15 — End: ?

## 2023-06-15 NOTE — Progress Notes (Signed)
Flu vaccine administered in right deltoid per protocols.  Information sheet given. Patient denies and pain or discomfort at injection site. Tolerated injection well no reaction.  
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
## 2023-06-16 LAB — CBC WITH DIFFERENTIAL/PLATELET
Basophils Absolute: 0.1 10*3/uL (ref 0.0–0.2)
Basos: 1 %
EOS (ABSOLUTE): 0.6 10*3/uL — ABNORMAL HIGH (ref 0.0–0.4)
Eos: 7 %
Hematocrit: 42 % (ref 37.5–51.0)
Hemoglobin: 13.4 g/dL (ref 13.0–17.7)
Immature Grans (Abs): 0 10*3/uL (ref 0.0–0.1)
Immature Granulocytes: 0 %
Lymphocytes Absolute: 3.3 10*3/uL — ABNORMAL HIGH (ref 0.7–3.1)
Lymphs: 39 %
MCH: 28.5 pg (ref 26.6–33.0)
MCHC: 31.9 g/dL (ref 31.5–35.7)
MCV: 89 fL (ref 79–97)
Monocytes Absolute: 0.6 10*3/uL (ref 0.1–0.9)
Monocytes: 7 %
Neutrophils Absolute: 4 10*3/uL (ref 1.4–7.0)
Neutrophils: 46 %
Platelets: 281 10*3/uL (ref 150–450)
RBC: 4.7 x10E6/uL (ref 4.14–5.80)
RDW: 13.4 % (ref 11.6–15.4)
WBC: 8.5 10*3/uL (ref 3.4–10.8)

## 2023-06-16 LAB — CMP14+EGFR
ALT: 19 [IU]/L (ref 0–44)
AST: 25 [IU]/L (ref 0–40)
Albumin: 3.8 g/dL — ABNORMAL LOW (ref 3.9–4.9)
Alkaline Phosphatase: 60 [IU]/L (ref 44–121)
BUN/Creatinine Ratio: 10 (ref 10–24)
BUN: 10 mg/dL (ref 8–27)
Bilirubin Total: 0.3 mg/dL (ref 0.0–1.2)
CO2: 23 mmol/L (ref 20–29)
Calcium: 9.1 mg/dL (ref 8.6–10.2)
Chloride: 104 mmol/L (ref 96–106)
Creatinine, Ser: 1.04 mg/dL (ref 0.76–1.27)
Globulin, Total: 1.9 g/dL (ref 1.5–4.5)
Glucose: 119 mg/dL — ABNORMAL HIGH (ref 70–99)
Potassium: 4.1 mmol/L (ref 3.5–5.2)
Sodium: 139 mmol/L (ref 134–144)
Total Protein: 5.7 g/dL — ABNORMAL LOW (ref 6.0–8.5)
eGFR: 81 mL/min/{1.73_m2} (ref >59)

## 2023-06-16 LAB — LIPID PANEL
Chol/HDL Ratio: 4 {ratio} (ref 0.0–5.0)
Cholesterol, Total: 202 mg/dL — ABNORMAL HIGH (ref 100–199)
HDL: 50 mg/dL (ref >39)
LDL Chol Calc (NIH): 119 mg/dL — ABNORMAL HIGH (ref 0–99)
Triglycerides: 188 mg/dL — ABNORMAL HIGH (ref 0–149)
VLDL Cholesterol Cal: 33 mg/dL (ref 5–40)

## 2023-06-16 LAB — HEMOGLOBIN A1C
Est. average glucose Bld gHb Est-mCnc: 126 mg/dL
Hgb A1c MFr Bld: 6 % — ABNORMAL HIGH (ref 4.8–5.6)

## 2023-06-25 ENCOUNTER — Ambulatory Visit: Payer: Self-pay

## 2023-06-25 NOTE — Telephone Encounter (Signed)
  Chief Complaint: chest pain Symptoms: chest pain and soreness in mornings when rubbing chest, 5-6/10, intermittent Frequency: a little while  Pertinent Negatives: Patient denies any other sx  Disposition: [] ED /[] Urgent Care (no appt availability in office) / [x] Appointment(In office/virtual)/ []  [facility]/ [] Home Care/ [] Refused Recommended Disposition /[] [facility]/ []  Follow-up with PCP Additional Notes: pt states he has the soreness in the mornings when he gets up to shower or wash off. Rubbing the chest has soreness. Scheduled 1st appt 07/15/23 with Marylene Land, PA. Added appt to waitlist. Care advice given and pt verbalized understanding.   Reason for Disposition  [1] Chest pain(s) lasting a few seconds AND [2] persists > 3 days  Answer Assessment - Initial Assessment Questions 1. LOCATION: "Where does it hurt?"       Both sides  3. ONSET: "When did the chest pain begin?" (Minutes, hours or days)      A little while  4. PATTERN: "Does the pain come and go, or has it been constant since it started?"  "Does it get worse with exertion?"      Comes and goes, in mornings 6. SEVERITY: "How bad is the pain?"  (e.g., Scale 1-10; mild, moderate, or severe)    - MILD (1-3): doesn't interfere with normal activities     - MODERATE (4-7): interferes with normal activities or awakens from sleep    - SEVERE (8-10): excruciating pain, unable to do any normal activities       5-6/10 7. CARDIAC RISK FACTORS: "Do you have any history of heart problems or risk factors for heart disease?" (e.g., angina, prior heart attack; diabetes, high blood pressure, high cholesterol, smoker, or strong family history of heart disease)     HTN cholesterol  10. OTHER SYMPTOMS: "Do you have any other symptoms?" (e.g., dizziness, nausea, vomiting, sweating, fever, difficulty breathing, cough)       Soreness  Protocols used: Chest Pain-A-AH

## 2023-07-15 ENCOUNTER — Ambulatory Visit: Payer: 59 | Attending: Physician Assistant | Admitting: Physician Assistant

## 2023-07-15 ENCOUNTER — Telehealth: Payer: Self-pay | Admitting: Nurse Practitioner

## 2023-07-15 ENCOUNTER — Encounter: Payer: Self-pay | Admitting: Physician Assistant

## 2023-07-15 VITALS — BP 129/75 | HR 84 | Wt 189.6 lb

## 2023-07-15 DIAGNOSIS — N63 Unspecified lump in unspecified breast: Secondary | ICD-10-CM | POA: Diagnosis not present

## 2023-07-15 DIAGNOSIS — N644 Mastodynia: Secondary | ICD-10-CM

## 2023-07-15 DIAGNOSIS — N62 Hypertrophy of breast: Secondary | ICD-10-CM

## 2023-07-15 NOTE — Progress Notes (Signed)
 Marland Kitchen

## 2023-07-15 NOTE — Telephone Encounter (Signed)
 Pt has

## 2023-07-15 NOTE — Progress Notes (Signed)
 Patient ID: Carl Vang, male   DOB: 1960/08/14, 63 y.o.   MRN: [id number]   Carl Vang, is a 63 y.o. male  RDW:[id number]  FMW:[id number]  DOB - Apr 02, 1961  Chief Complaint  Patient presents with   lumps on chest     Patients states that they are sore to touch        Subjective:   Carl Vang is a 63 y.o. male here today for breast soreness B since noticing he has gained some weight.  Over the past month or so, his breasts have felt tender and heavier than before.  He is not sure if it is because he has gained weight or not.  No discreet masses.  No FH breast CA in males.  No nipple discharge.    No problems updated.  ALLERGIES: No Known Allergies  PAST MEDICAL HISTORY: Past Medical History:  Diagnosis Date   Hyperlipidemia    Hypertension    Osteoarthritis of knee    Pre-diabetes    pt denies   Prediabetes     MEDICATIONS AT HOME: Prior to Admission medications   Medication Sig Start Date End Date Taking? Authorizing Provider  amLODipine  (NORVASC ) 10 MG tablet Take 1 tablet (10 mg total) by mouth daily. 06/15/23  Yes Theotis Haze LELON, NP  aspirin  EC 325 MG tablet Take 1 tablet (325 mg total) by mouth daily. 06/15/23  Yes Theotis Haze LELON, NP  atorvastatin  (LIPITOR) 20 MG tablet Take 1 tablet (20 mg total) by mouth every evening. 06/15/23  Yes Fleming, Zelda W, NP  chlorthalidone  (HYGROTON ) 25 MG tablet Take 1 tablet (25 mg total) by mouth daily. 06/15/23  Yes Theotis Haze LELON, NP  docusate sodium  (COLACE) 100 MG capsule Take 1 capsule (100 mg total) by mouth 2 (two) times daily as needed (for constipation). 06/15/23  Yes Theotis Haze LELON, NP    ROS: Neg HEENT Neg resp Neg cardiac Neg GI Neg GU Neg MS Neg psych Neg neuro  Objective:   Vitals:   07/15/23 1500  BP: 129/75  Pulse: 84  SpO2: 95%  Weight: 189 lb 9.6 oz (86 kg)   Exam General appearance : Awake, alert, not in any distress. Speech Clear. Not toxic looking HEENT: Atraumatic and  Normocephalic Neck: Supple, no JVD. No cervical lymphadenopathy.  Chest: Good air entry bilaterally, CTAB.  No rales/rhonchi/wheezing CVS: S1 S2 regular, no murmurs.  B gynecomastia- no discreet lumps/masses.  No localized tenderness.  No nipple discharge expressed.   Extremities: B/L Lower Ext shows no edema, both legs are warm to touch Neurology: Awake alert, and oriented X 3, CN II-XII intact, Non focal Skin: No Rash  Data Review Lab Results  Component Value Date   HGBA1C 6.0 (H) 06/15/2023   HGBA1C 5.8 (H) 08/07/2022   HGBA1C 5.9 (H) 02/17/2022    Assessment & Plan   1. Painful lumpy breasts (Primary) - US  LIMITED ULTRASOUND INCLUDING AXILLA LEFT BREAST ; Future - US  LIMITED ULTRASOUND INCLUDING AXILLA RIGHT BREAST; Future    Return for June with Zelda as scheduled.  The patient was given clear instructions to go to ER or return to medical center if symptoms don't improve, worsen or new problems develop. The patient verbalized understanding. The patient was told to call to get lab results if they haven't heard anything in the next week.      Jon Moores, PA-C [facility] [place], [place] [phone]   07/15/2023, 3:10 PM

## 2023-07-16 NOTE — Telephone Encounter (Signed)
 Pt requesting another referral to Audiology

## 2023-07-20 ENCOUNTER — Other Ambulatory Visit: Payer: Self-pay | Admitting: Physician Assistant

## 2023-07-20 DIAGNOSIS — N63 Unspecified lump in unspecified breast: Secondary | ICD-10-CM

## 2023-08-11 ENCOUNTER — Encounter: Payer: Self-pay | Admitting: Physician Assistant

## 2023-08-23 ENCOUNTER — Ambulatory Visit: Payer: 59 | Attending: Nurse Practitioner | Admitting: Audiologist

## 2023-08-26 ENCOUNTER — Other Ambulatory Visit: Payer: 59

## 2023-08-30 DIAGNOSIS — H905 Unspecified sensorineural hearing loss: Secondary | ICD-10-CM | POA: Diagnosis not present

## 2023-09-08 ENCOUNTER — Other Ambulatory Visit: Payer: 59

## 2023-09-08 ENCOUNTER — Other Ambulatory Visit

## 2023-09-08 ENCOUNTER — Encounter

## 2023-09-10 ENCOUNTER — Other Ambulatory Visit: Payer: 59

## 2023-09-30 ENCOUNTER — Ambulatory Visit

## 2023-09-30 ENCOUNTER — Ambulatory Visit
Admission: RE | Admit: 2023-09-30 | Discharge: 2023-09-30 | Disposition: A | Discharge status: Home / Self Care | Source: Ambulatory Visit | Attending: Physician Assistant | Admitting: Physician Assistant

## 2023-09-30 DIAGNOSIS — N63 Unspecified lump in unspecified breast: Secondary | ICD-10-CM

## 2023-09-30 DIAGNOSIS — N62 Hypertrophy of breast: Secondary | ICD-10-CM | POA: Diagnosis not present

## 2023-10-01 ENCOUNTER — Encounter: Payer: Self-pay | Admitting: Physician Assistant

## 2023-10-01 ENCOUNTER — Other Ambulatory Visit: Payer: 59

## 2023-10-19 ENCOUNTER — Ambulatory Visit: Payer: Self-pay | Admitting: Nurse Practitioner

## 2023-11-09 ENCOUNTER — Ambulatory Visit: Admitting: Nurse Practitioner

## 2023-12-06 ENCOUNTER — Ambulatory Visit: Payer: Self-pay

## 2023-12-06 NOTE — Telephone Encounter (Signed)
  Chief Complaint: chest pain , back pain Symptoms: SOB and chest pain worse with exertion Frequency: 2 months Pertinent Negatives: Patient denies sweating, nausea Disposition: [x] ED /[] Urgent Care (no appt availability in office) / [] Appointment(In office/virtual)/ []  [facility]/ [] Home Care/ [] Refused Recommended Disposition /[] [facility] [place] [facility]/ []  Follow-up with PCP Additional Notes: to ED Copied from CRM [id number]. Topic: Clinical - Red Word Triage >> Dec 06, 2023 12:57 PM Emylou G wrote: [facility] that prompted transfer to Nurse Triage: chest pains struggling with his medications Reason for Disposition  [1] Chest pain (or "angina") comes and goes AND [2] is happening more often (increasing in frequency) or getting worse (increasing in severity)  (Exception: Chest pains that last only a few seconds.)  Answer Assessment - Initial Assessment Questions 1. LOCATION: "Where does it hurt?"       Upper part 2. RADIATION: "Does the pain go anywhere else?" (e.g., into neck, jaw, arms, back)     Back  3. ONSET: "When did the chest pain begin?" (Minutes, hours or days)      1-2 month  4. PATTERN: "Does the pain come and go, or has it been constant since it started?"  "Does it get worse with exertion?"      Early am - worse with exertion  5. DURATION: "How long does it last" (e.g., seconds, minutes, hours)     *No Answer* 6. SEVERITY: "How bad is the pain?"  (e.g., Scale 1-10; mild, moderate, or severe)    - MILD (1-3): doesn't interfere with normal activities     - MODERATE (4-7): interferes with normal activities or awakens from sleep    - SEVERE (8-10): excruciating pain, unable to do any normal activities       6-7/10  7. CARDIAC RISK FACTORS: "Do you have any history of heart problems or risk factors for heart disease?" (e.g., angina, prior heart attack; diabetes, high blood pressure, high cholesterol, smoker, or strong family history of heart disease)     *No Answer* 8.  PULMONARY RISK FACTORS: "Do you have any history of lung disease?"  (e.g., blood clots in lung, asthma, emphysema, birth control pills)     *No Answer* 9. CAUSE: "What do you think is causing the chest pain?"     *No Answer* 10. OTHER SYMPTOMS: "Do you have any other symptoms?" (e.g., dizziness, nausea, vomiting, sweating, fever, difficulty breathing, cough)  SOB with walking has worsened  Protocols used: Chest Pain-A-AH

## 2023-12-08 ENCOUNTER — Other Ambulatory Visit: Payer: Self-pay | Admitting: Nurse Practitioner

## 2023-12-08 DIAGNOSIS — I10 Essential (primary) hypertension: Secondary | ICD-10-CM

## 2023-12-14 ENCOUNTER — Encounter: Payer: Self-pay | Admitting: Nurse Practitioner

## 2023-12-14 ENCOUNTER — Ambulatory Visit: Payer: 59 | Attending: Nurse Practitioner | Admitting: Nurse Practitioner

## 2023-12-14 VITALS — BP 127/69 | HR 71 | Resp 19 | Ht 65.0 in | Wt 188.0 lb

## 2023-12-14 DIAGNOSIS — D72829 Elevated white blood cell count, unspecified: Secondary | ICD-10-CM

## 2023-12-14 DIAGNOSIS — I10 Essential (primary) hypertension: Secondary | ICD-10-CM | POA: Diagnosis not present

## 2023-12-14 DIAGNOSIS — R7303 Prediabetes: Secondary | ICD-10-CM

## 2023-12-14 DIAGNOSIS — E7800 Pure hypercholesterolemia, unspecified: Secondary | ICD-10-CM | POA: Diagnosis not present

## 2023-12-14 DIAGNOSIS — Z23 Encounter for immunization: Secondary | ICD-10-CM | POA: Diagnosis not present

## 2023-12-14 MED ORDER — CELECOXIB 200 MG PO CAPS: 200.0000 mg | ORAL_CAPSULE | Freq: Two times a day (BID) | ORAL | 0 refills | Status: DC | PRN

## 2023-12-14 MED ORDER — ASPIRIN 325 MG PO TBEC: 325.0000 mg | DELAYED_RELEASE_TABLET | Freq: Every day | ORAL | 1 refills | Status: AC

## 2023-12-14 MED ORDER — AMLODIPINE BESYLATE 10 MG PO TABS: 10.0000 mg | ORAL_TABLET | Freq: Every day | ORAL | 1 refills | Status: AC

## 2023-12-14 MED ORDER — CHLORTHALIDONE 25 MG PO TABS: 25.0000 mg | ORAL_TABLET | Freq: Every day | ORAL | 1 refills | Status: AC
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
## 2023-12-15 LAB — CBC WITH DIFFERENTIAL/PLATELET
Basophils Absolute: 0.1 10*3/uL (ref 0.0–0.2)
Basos: 1 %
EOS (ABSOLUTE): 0.4 10*3/uL (ref 0.0–0.4)
Eos: 4 %
Hematocrit: 44.9 % (ref 37.5–51.0)
Hemoglobin: 14.8 g/dL (ref 13.0–17.7)
Immature Grans (Abs): 0 10*3/uL (ref 0.0–0.1)
Immature Granulocytes: 0 %
Lymphocytes Absolute: 4.6 10*3/uL — ABNORMAL HIGH (ref 0.7–3.1)
Lymphs: 43 %
MCH: 29.2 pg (ref 26.6–33.0)
MCHC: 33 g/dL (ref 31.5–35.7)
MCV: 89 fL (ref 79–97)
Monocytes Absolute: 0.9 10*3/uL (ref 0.1–0.9)
Monocytes: 8 %
Neutrophils Absolute: 4.6 10*3/uL (ref 1.4–7.0)
Neutrophils: 44 %
Platelets: 304 10*3/uL (ref 150–450)
RBC: 5.06 x10E6/uL (ref 4.14–5.80)
RDW: 13.5 % (ref 11.6–15.4)
WBC: 10.5 10*3/uL (ref 3.4–10.8)

## 2023-12-15 LAB — CMP14+EGFR
ALT: 17 IU/L (ref 0–44)
AST: 19 IU/L (ref 0–40)
Albumin: 3.8 g/dL — ABNORMAL LOW (ref 3.9–4.9)
Alkaline Phosphatase: 58 IU/L (ref 44–121)
BUN/Creatinine Ratio: 10 (ref 10–24)
BUN: 11 mg/dL (ref 8–27)
Bilirubin Total: <0.2 mg/dL (ref 0.0–1.2)
CO2: 22 mmol/L (ref 20–29)
Calcium: 9 mg/dL (ref 8.6–10.2)
Chloride: 103 mmol/L (ref 96–106)
Creatinine, Ser: 1.1 mg/dL (ref 0.76–1.27)
Globulin, Total: 1.7 g/dL (ref 1.5–4.5)
Glucose: 107 mg/dL — ABNORMAL HIGH (ref 70–99)
Potassium: 4.2 mmol/L (ref 3.5–5.2)
Sodium: 139 mmol/L (ref 134–144)
Total Protein: 5.5 g/dL — ABNORMAL LOW (ref 6.0–8.5)
eGFR: 76 mL/min/1.73

## 2023-12-15 LAB — LIPID PANEL
Chol/HDL Ratio: 4 ratio (ref 0.0–5.0)
Cholesterol, Total: 166 mg/dL (ref 100–199)
HDL: 42 mg/dL
LDL Chol Calc (NIH): 97 mg/dL (ref 0–99)
Triglycerides: 154 mg/dL — ABNORMAL HIGH (ref 0–149)
VLDL Cholesterol Cal: 27 mg/dL (ref 5–40)

## 2023-12-15 LAB — HEMOGLOBIN A1C
Est. average glucose Bld gHb Est-mCnc: 128 mg/dL
Hgb A1c MFr Bld: 6.1 % — ABNORMAL HIGH (ref 4.8–5.6)

## 2023-12-17 ENCOUNTER — Telehealth: Payer: Self-pay | Admitting: Nurse Practitioner

## 2023-12-17 ENCOUNTER — Other Ambulatory Visit: Payer: Self-pay | Admitting: Nurse Practitioner

## 2023-12-17 DIAGNOSIS — E7800 Pure hypercholesterolemia, unspecified: Secondary | ICD-10-CM

## 2023-12-17 NOTE — Telephone Encounter (Signed)
 Pt states at his 6/10 office visit he said he did not need his muscle relaxer filled, however pt states now he needs it. Unable to locate muscle relaxer on pt's medication list.

## 2023-12-17 NOTE — Telephone Encounter (Unsigned)
 Copied from CRM [id number]. Topic: Clinical - Medication Refill >> Dec 17, 2023  4:37 PM Fredrica W wrote: Medication: Muscle relaxer - pt not sure of name - was seen 6/10 told her he did not need refill but he does. Is currently out of medication.   Has the patient contacted their pharmacy? No (Agent: If no, request that the patient contact the pharmacy for the refill. If patient does not wish to contact the pharmacy document the reason why and proceed with request.) (Agent: If yes, when and what did the pharmacy advise?)  This is the patient's preferred pharmacy:   [id number] [place], [place] - [address]  [address] Phone: [phone] Fax: [phone]  Is this the correct pharmacy for this prescription? Yes If no, delete pharmacy and type the correct one.   Has the prescription been filled recently? N/A  Is the patient out of the medication? Yes  Has the patient been seen for an appointment in the last year OR does the patient have an upcoming appointment? Yes 6/10   Can we respond through MyChart? No  Agent: Please be advised that Rx refills may take up to 3 business days. We ask that you follow-up with your pharmacy.

## 2023-12-20 ENCOUNTER — Encounter: Payer: Self-pay | Admitting: Nurse Practitioner

## 2023-12-20 ENCOUNTER — Ambulatory Visit: Attending: Nurse Practitioner | Admitting: Nurse Practitioner

## 2023-12-20 ENCOUNTER — Ambulatory Visit: Payer: Self-pay | Admitting: Nurse Practitioner

## 2023-12-20 DIAGNOSIS — M791 Myalgia, unspecified site: Secondary | ICD-10-CM

## 2023-12-20 NOTE — Progress Notes (Signed)
  Tita Form  Patient Mobile Phone Send Link Via Text Last text sent1:39 PM To:[phone]    Email Send Link Via Email Last email sent1:39 PM [id number]

## 2023-12-20 NOTE — Progress Notes (Signed)
 ATTEMPTED TO REACH MR Klopf. NO ANSWER. UNABLE TO LVM

## 2023-12-20 NOTE — Telephone Encounter (Signed)
Patient schedule for VV today.

## 2023-12-20 NOTE — Progress Notes (Signed)
 Attempted to call Mr rawlinson at 1:49pm. No answer and unable to leave voicemail

## 2023-12-28 ENCOUNTER — Ambulatory Visit

## 2024-01-05 DIAGNOSIS — B351 Tinea unguium: Secondary | ICD-10-CM | POA: Diagnosis not present

## 2024-03-04 ENCOUNTER — Other Ambulatory Visit (HOSPITAL_COMMUNITY): Payer: Self-pay

## 2024-05-02 ENCOUNTER — Ambulatory Visit: Attending: Nurse Practitioner

## 2024-05-02 ENCOUNTER — Telehealth: Payer: Self-pay | Admitting: Nurse Practitioner

## 2024-05-02 VITALS — BP 124/80 | HR 80 | Temp 98.0°F | Resp 16 | Ht 65.0 in | Wt 189.6 lb

## 2024-05-02 DIAGNOSIS — Z0000 Encounter for general adult medical examination without abnormal findings: Secondary | ICD-10-CM | POA: Diagnosis not present

## 2024-05-02 DIAGNOSIS — Z23 Encounter for immunization: Secondary | ICD-10-CM | POA: Diagnosis not present

## 2024-05-02 NOTE — Telephone Encounter (Addendum)
 Patient completed the Disability Parking Placard form. Form has been placed in the provider's box for review.

## 2024-05-02 NOTE — Patient Instructions (Signed)
 Carl Vang,  Thank you for taking the time for your Medicare Wellness Visit. I appreciate your continued commitment to your health goals. Please review the care plan we discussed, and feel free to reach out if I can assist you further.  Medicare recommends these wellness visits once per year to help you and your care team stay ahead of potential health issues. These visits are designed to focus on prevention, allowing your provider to concentrate on managing your acute and chronic conditions during your regular appointments.  Please note that Annual Wellness Visits do not include a physical exam. Some assessments may be limited, especially if the visit was conducted virtually. If needed, we may recommend a separate in-person follow-up with your provider.  Ongoing Care Seeing your primary care provider every 3 to 6 months helps us  monitor your health and provide consistent, personalized care.   Referrals If a referral was made during today's visit and you haven't received any updates within two weeks, please contact the referred provider directly to check on the status.  Recommended Screenings:  Health Maintenance  Topic Date Due   Colon Cancer Screening  Never done   Pneumococcal Vaccine for age over 38 (1 of 1 - PCV) Never done   COVID-19 Vaccine (2 - Pfizer risk series) 07/09/2023   Zoster (Shingles) Vaccine (2 of 2) 08/13/2023   Medicare Annual Wellness Visit  09/11/2023   Flu Shot  02/04/2024   DTaP/Tdap/Td vaccine (2 - Td or Tdap) 06/04/2026   Hepatitis C Screening  Completed   HIV Screening  Completed   Hepatitis B Vaccine  Aged Out   HPV Vaccine  Aged Out   Meningitis B Vaccine  Aged Out       05/02/2024    2:33 PM  Advanced Directives  Does Patient Have a Medical Advance Directive? No  Would patient like information on creating a medical advance directive? No - Patient declined   Advance Care Planning is important because it: Ensures you receive medical care that  aligns with your values, goals, and preferences. Provides guidance to your family and loved ones, reducing the emotional burden of decision-making during critical moments.  Vision: Annual vision screenings are recommended for early detection of glaucoma, cataracts, and diabetic retinopathy. These exams can also reveal signs of chronic conditions such as diabetes and high blood pressure.  Dental: Annual dental screenings help detect early signs of oral cancer, gum disease, and other conditions linked to overall health, including heart disease and diabetes.  Please see the attached documents for additional preventive care recommendations.
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
## 2024-05-03 NOTE — Telephone Encounter (Signed)
 Patient identified by name and date of birth.  Patient aware of form being ready for pick-up.

## 2024-05-03 NOTE — Telephone Encounter (Signed)
 Form signed and placed in folder under E.

## 2024-05-04 NOTE — Telephone Encounter (Signed)
 Patient picked up paperwork.

## 2024-05-11 ENCOUNTER — Other Ambulatory Visit: Payer: Self-pay | Admitting: Pharmacist

## 2024-05-11 NOTE — Progress Notes (Signed)
 Pharmacy Quality Measure Review  This patient is appearing on a report for the adherence measure for cholesterol (statin) medications this calendar year.   Medication: atorvastatin   Last fill date: 03/02/2024 for 90 day supply  Insurance report was not up to date. No action needed at this time. Reminder set for next fill.   Herlene Fleeta Morris, PharmD, JAQUELINE, CPP Clinical Pharmacist [facility] [phone]

## 2024-05-31 ENCOUNTER — Other Ambulatory Visit: Payer: Self-pay | Admitting: Pharmacist

## 2024-05-31 NOTE — Progress Notes (Signed)
 Pharmacy Quality Measure Review  This patient is appearing on a report for the adherence measure for cholesterol (statin) medications this calendar year.   Medication: atorvastatin   Last fill date: 03/02/2024 for 90 day supply  Collaborated with patient's pharmacy to facilitate refill. They are filling this and his chlorthalidone . I contacted him to tell him these are for pick-up but had to leave a VM.   Herlene Fleeta Morris, PharmD, JAQUELINE, CPP Clinical Pharmacist [facility] [phone]

## 2024-08-08 ENCOUNTER — Ambulatory Visit: Payer: Self-pay | Admitting: Nurse Practitioner

## 2024-08-08 ENCOUNTER — Other Ambulatory Visit (HOSPITAL_COMMUNITY): Admission: RE | Admit: 2024-08-08 | Discharge: 2024-08-08 | Disposition: A | Source: Ambulatory Visit

## 2024-08-08 ENCOUNTER — Encounter: Payer: Self-pay | Admitting: Nurse Practitioner

## 2024-08-08 VITALS — BP 145/79 | HR 82 | Ht 65.0 in | Wt 188.9 lb

## 2024-08-08 DIAGNOSIS — Z0000 Encounter for general adult medical examination without abnormal findings: Secondary | ICD-10-CM

## 2024-08-08 DIAGNOSIS — Z7251 High risk heterosexual behavior: Secondary | ICD-10-CM

## 2024-08-08 DIAGNOSIS — H538 Other visual disturbances: Secondary | ICD-10-CM | POA: Diagnosis not present

## 2024-08-08 DIAGNOSIS — M1611 Unilateral primary osteoarthritis, right hip: Secondary | ICD-10-CM | POA: Diagnosis not present

## 2024-08-08 DIAGNOSIS — H903 Sensorineural hearing loss, bilateral: Secondary | ICD-10-CM

## 2024-08-08 DIAGNOSIS — R7303 Prediabetes: Secondary | ICD-10-CM | POA: Diagnosis not present

## 2024-08-08 MED ORDER — CELECOXIB 200 MG PO CAPS: 200.0000 mg | ORAL_CAPSULE | Freq: Every day | ORAL | 0 refills | Status: AC | PRN

## 2024-08-09 LAB — CMP14+EGFR
ALT: 14 [IU]/L (ref 0–44)
AST: 21 [IU]/L (ref 0–40)
Albumin: 4.5 g/dL (ref 3.9–4.9)
Alkaline Phosphatase: 73 [IU]/L (ref 47–123)
BUN/Creatinine Ratio: 11 (ref 10–24)
BUN: 12 mg/dL (ref 8–27)
Bilirubin Total: 0.4 mg/dL (ref 0.0–1.2)
CO2: 21 mmol/L (ref 20–29)
Calcium: 9.6 mg/dL (ref 8.6–10.2)
Chloride: 104 mmol/L (ref 96–106)
Creatinine, Ser: 1.07 mg/dL (ref 0.76–1.27)
Globulin, Total: 2.6 g/dL (ref 1.5–4.5)
Glucose: 90 mg/dL (ref 70–99)
Potassium: 4.4 mmol/L (ref 3.5–5.2)
Sodium: 140 mmol/L (ref 134–144)
Total Protein: 7.1 g/dL (ref 6.0–8.5)
eGFR: 78 mL/min/{1.73_m2}

## 2024-08-09 LAB — CBC WITH DIFFERENTIAL/PLATELET
Basophils Absolute: 0 10*3/uL (ref 0.0–0.2)
Basos: 1 %
EOS (ABSOLUTE): 0.5 10*3/uL — ABNORMAL HIGH (ref 0.0–0.4)
Eos: 5 %
Hematocrit: 44.6 % (ref 37.5–51.0)
Hemoglobin: 14.5 g/dL (ref 13.0–17.7)
Immature Grans (Abs): 0 10*3/uL (ref 0.0–0.1)
Immature Granulocytes: 0 %
Lymphocytes Absolute: 3 10*3/uL (ref 0.7–3.1)
Lymphs: 35 %
MCH: 28.3 pg (ref 26.6–33.0)
MCHC: 32.5 g/dL (ref 31.5–35.7)
MCV: 87 fL (ref 79–97)
Monocytes Absolute: 0.7 10*3/uL (ref 0.1–0.9)
Monocytes: 9 %
Neutrophils Absolute: 4.4 10*3/uL (ref 1.4–7.0)
Neutrophils: 50 %
Platelets: 306 10*3/uL (ref 150–450)
RBC: 5.12 x10E6/uL (ref 4.14–5.80)
RDW: 13.1 % (ref 11.6–15.4)
WBC: 8.6 10*3/uL (ref 3.4–10.8)

## 2024-08-09 LAB — URINE CYTOLOGY ANCILLARY ONLY
Chlamydia: NEGATIVE
Comment: NEGATIVE
Comment: NEGATIVE
Comment: NORMAL
Neisseria Gonorrhea: NEGATIVE
Trichomonas: NEGATIVE

## 2024-08-09 LAB — LIPID PANEL
Chol/HDL Ratio: 4.4 ratio (ref 0.0–5.0)
Cholesterol, Total: 197 mg/dL (ref 100–199)
HDL: 45 mg/dL
LDL Chol Calc (NIH): 124 mg/dL — ABNORMAL HIGH (ref 0–99)
Triglycerides: 160 mg/dL — ABNORMAL HIGH (ref 0–149)
VLDL Cholesterol Cal: 28 mg/dL (ref 5–40)

## 2024-08-09 LAB — HEMOGLOBIN A1C
Est. average glucose Bld gHb Est-mCnc: 126 mg/dL
Hgb A1c MFr Bld: 6 % — ABNORMAL HIGH (ref 4.8–5.6)

## 2024-08-09 LAB — SYPHILIS: RPR WITH REFLEX TO RPR TITER: RPR Ser Ql: NONREACTIVE

## 2024-08-09 LAB — HIV ANTIBODY (ROUTINE TESTING W REFLEX): HIV Screen 4th Generation wRfx: NONREACTIVE

## 2024-12-18 ENCOUNTER — Ambulatory Visit: Payer: Self-pay | Admitting: Nurse Practitioner
